# Patient Record
Sex: Female | Born: 1955 | Race: White | Hispanic: No | Smoking: Former smoker
Health system: Southern US, Community
[De-identification: ages and names within clinical notes are randomized; demographics above are authoritative.]

## PROBLEM LIST (undated history)

## (undated) DIAGNOSIS — M519 Unspecified thoracic, thoracolumbar and lumbosacral intervertebral disc disorder: Secondary | ICD-10-CM

## (undated) DIAGNOSIS — G43909 Migraine, unspecified, not intractable, without status migrainosus: Secondary | ICD-10-CM

## (undated) DIAGNOSIS — K279 Peptic ulcer, site unspecified, unspecified as acute or chronic, without hemorrhage or perforation: Secondary | ICD-10-CM

## (undated) DIAGNOSIS — F32A Depression, unspecified: Secondary | ICD-10-CM

## (undated) DIAGNOSIS — F419 Anxiety disorder, unspecified: Secondary | ICD-10-CM

## (undated) DIAGNOSIS — S82892A Other fracture of left lower leg, initial encounter for closed fracture: Secondary | ICD-10-CM

## (undated) DIAGNOSIS — C801 Malignant (primary) neoplasm, unspecified: Secondary | ICD-10-CM

## (undated) DIAGNOSIS — E785 Hyperlipidemia, unspecified: Secondary | ICD-10-CM

## (undated) DIAGNOSIS — M1990 Unspecified osteoarthritis, unspecified site: Secondary | ICD-10-CM

## (undated) DIAGNOSIS — J189 Pneumonia, unspecified organism: Secondary | ICD-10-CM

## (undated) DIAGNOSIS — N644 Mastodynia: Secondary | ICD-10-CM

## (undated) DIAGNOSIS — F329 Major depressive disorder, single episode, unspecified: Secondary | ICD-10-CM

## (undated) DIAGNOSIS — I10 Essential (primary) hypertension: Secondary | ICD-10-CM

## (undated) DIAGNOSIS — I2510 Atherosclerotic heart disease of native coronary artery without angina pectoris: Secondary | ICD-10-CM

## (undated) HISTORY — DX: Other fracture of left lower leg, initial encounter for closed fracture: S82.892A

## (undated) HISTORY — DX: Mastodynia: N64.4

## (undated) HISTORY — PX: COLONOSCOPY: SHX174

## (undated) HISTORY — PX: BREAST SURGERY: SHX581

## (undated) HISTORY — DX: Anxiety disorder, unspecified: F41.9

## (undated) HISTORY — DX: Hyperlipidemia, unspecified: E78.5

## (undated) HISTORY — PX: AUGMENTATION MAMMAPLASTY: SUR837

## (undated) HISTORY — DX: Peptic ulcer, site unspecified, unspecified as acute or chronic, without hemorrhage or perforation: K27.9

## (undated) HISTORY — PX: CARPAL TUNNEL RELEASE: SHX101

## (undated) HISTORY — DX: Atherosclerotic heart disease of native coronary artery without angina pectoris: I25.10

## (undated) HISTORY — PX: CERVICAL CONE BIOPSY: SUR198

---

## 1996-01-08 HISTORY — PX: KNEE ARTHROSCOPY: SUR90

## 1996-07-07 HISTORY — PX: KNEE ARTHROSCOPY: SHX127

## 1998-06-19 ENCOUNTER — Other Ambulatory Visit: Admission: RE | Admit: 1998-06-19 | Discharge: 1998-06-19 | Payer: Self-pay | Admitting: Obstetrics and Gynecology

## 1998-08-13 ENCOUNTER — Emergency Department (HOSPITAL_COMMUNITY): Admission: EM | Admit: 1998-08-13 | Discharge: 1998-08-13 | Payer: Self-pay | Admitting: Emergency Medicine

## 1999-06-25 ENCOUNTER — Other Ambulatory Visit: Admission: RE | Admit: 1999-06-25 | Discharge: 1999-06-25 | Payer: Self-pay | Admitting: Obstetrics and Gynecology

## 2001-06-02 ENCOUNTER — Other Ambulatory Visit: Admission: RE | Admit: 2001-06-02 | Discharge: 2001-06-02 | Payer: Self-pay | Admitting: Obstetrics and Gynecology

## 2002-04-21 ENCOUNTER — Emergency Department (HOSPITAL_COMMUNITY): Admission: EM | Admit: 2002-04-21 | Discharge: 2002-04-21 | Payer: Self-pay | Admitting: Emergency Medicine

## 2002-04-21 ENCOUNTER — Encounter: Payer: Self-pay | Admitting: Emergency Medicine

## 2002-05-18 ENCOUNTER — Emergency Department (HOSPITAL_COMMUNITY): Admission: EM | Admit: 2002-05-18 | Discharge: 2002-05-18 | Payer: Self-pay | Admitting: Emergency Medicine

## 2002-05-28 ENCOUNTER — Emergency Department (HOSPITAL_COMMUNITY): Admission: EM | Admit: 2002-05-28 | Discharge: 2002-05-28 | Payer: Self-pay | Admitting: Emergency Medicine

## 2003-03-23 ENCOUNTER — Other Ambulatory Visit: Admission: RE | Admit: 2003-03-23 | Discharge: 2003-03-23 | Payer: Self-pay | Admitting: Obstetrics and Gynecology

## 2003-04-23 ENCOUNTER — Emergency Department (HOSPITAL_COMMUNITY): Admission: EM | Admit: 2003-04-23 | Discharge: 2003-04-23 | Payer: Self-pay | Admitting: Emergency Medicine

## 2003-05-21 ENCOUNTER — Emergency Department (HOSPITAL_COMMUNITY): Admission: EM | Admit: 2003-05-21 | Discharge: 2003-05-21 | Payer: Self-pay | Admitting: Emergency Medicine

## 2007-08-22 ENCOUNTER — Encounter: Admission: RE | Admit: 2007-08-22 | Discharge: 2007-08-22 | Payer: Self-pay | Admitting: Neurosurgery

## 2008-02-29 ENCOUNTER — Ambulatory Visit (HOSPITAL_COMMUNITY): Admission: RE | Admit: 2008-02-29 | Discharge: 2008-02-29 | Payer: Self-pay | Admitting: Orthopaedic Surgery

## 2008-03-02 ENCOUNTER — Observation Stay (HOSPITAL_COMMUNITY): Admission: EM | Admit: 2008-03-02 | Discharge: 2008-03-02 | Payer: Self-pay | Admitting: Emergency Medicine

## 2008-03-22 ENCOUNTER — Ambulatory Visit (HOSPITAL_BASED_OUTPATIENT_CLINIC_OR_DEPARTMENT_OTHER): Admission: RE | Admit: 2008-03-22 | Discharge: 2008-03-22 | Payer: Self-pay | Admitting: Orthopaedic Surgery

## 2008-11-05 ENCOUNTER — Encounter: Admission: RE | Admit: 2008-11-05 | Discharge: 2008-11-05 | Payer: Self-pay | Admitting: Neurosurgery

## 2008-12-15 ENCOUNTER — Encounter: Admission: RE | Admit: 2008-12-15 | Discharge: 2008-12-15 | Payer: Self-pay | Admitting: Neurosurgery

## 2009-02-14 ENCOUNTER — Encounter: Admission: RE | Admit: 2009-02-14 | Discharge: 2009-02-14 | Payer: Self-pay | Admitting: Neurosurgery

## 2009-03-30 ENCOUNTER — Encounter: Admission: RE | Admit: 2009-03-30 | Discharge: 2009-03-30 | Payer: Self-pay | Admitting: Neurosurgery

## 2009-07-04 ENCOUNTER — Inpatient Hospital Stay (HOSPITAL_COMMUNITY): Admission: RE | Admit: 2009-07-04 | Discharge: 2009-07-07 | Payer: Self-pay | Admitting: Neurosurgery

## 2009-07-07 HISTORY — PX: SPINE SURGERY: SHX786

## 2010-03-25 LAB — URINALYSIS, ROUTINE W REFLEX MICROSCOPIC
Glucose, UA: NEGATIVE mg/dL
Hgb urine dipstick: NEGATIVE
Ketones, ur: NEGATIVE mg/dL
Nitrite: NEGATIVE
Urobilinogen, UA: 0.2 mg/dL (ref 0.0–1.0)
pH: 6.5 (ref 5.0–8.0)

## 2010-03-25 LAB — DIFFERENTIAL
Basophils Absolute: 0 10*3/uL (ref 0.0–0.1)
Eosinophils Relative: 1 % (ref 0–5)
Lymphs Abs: 1.3 10*3/uL (ref 0.7–4.0)

## 2010-03-25 LAB — ABO/RH: ABO/RH(D): A POS

## 2010-03-25 LAB — CBC
Hemoglobin: 13.6 g/dL (ref 12.0–15.0)
MCH: 29.8 pg (ref 26.0–34.0)
MCHC: 33.8 g/dL (ref 30.0–36.0)
MCV: 88.4 fL (ref 78.0–100.0)
RBC: 4.56 MIL/uL (ref 3.87–5.11)
RDW: 13.7 % (ref 11.5–15.5)
WBC: 6 10*3/uL (ref 4.0–10.5)

## 2010-03-25 LAB — SURGICAL PCR SCREEN: Staphylococcus aureus: NEGATIVE

## 2010-03-25 LAB — COMPREHENSIVE METABOLIC PANEL
Albumin: 4.2 g/dL (ref 3.5–5.2)
CO2: 23 mEq/L (ref 19–32)
GFR calc non Af Amer: >60 mL/min (ref >60)
Sodium: 141 mEq/L (ref 135–145)
Total Protein: 6.7 g/dL (ref 6.0–8.3)

## 2010-03-25 LAB — PROTIME-INR
INR: 0.91 (ref 0.00–1.49)
Prothrombin Time: 12.2 seconds (ref 11.6–15.2)

## 2010-03-25 LAB — MRSA PCR SCREENING: MRSA by PCR: NEGATIVE

## 2010-04-19 LAB — POCT I-STAT, CHEM 8
Chloride: 109 mEq/L (ref 96–112)
Glucose, Bld: 104 mg/dL — ABNORMAL HIGH (ref 70–99)
Hemoglobin: 12.9 g/dL (ref 12.0–15.0)
Potassium: 4.2 mEq/L (ref 3.5–5.1)

## 2010-04-24 LAB — COMPREHENSIVE METABOLIC PANEL
AST: 11 U/L (ref 0–37)
Albumin: 3.6 g/dL (ref 3.5–5.2)
Calcium: 9.5 mg/dL (ref 8.4–10.5)
GFR calc Af Amer: >60 mL/min (ref >60)
GFR calc non Af Amer: >60 mL/min (ref >60)
Sodium: 140 mEq/L (ref 135–145)
Total Bilirubin: 0.5 mg/dL (ref 0.3–1.2)

## 2010-04-24 LAB — BASIC METABOLIC PANEL
BUN: 10 mg/dL (ref 6–23)
CO2: 22 mEq/L (ref 19–32)
Calcium: 9.7 mg/dL (ref 8.4–10.5)
GFR calc non Af Amer: >60 mL/min (ref >60)
Sodium: 142 mEq/L (ref 135–145)

## 2010-04-24 LAB — DIFFERENTIAL
Basophils Relative: 0 % (ref 0–1)
Eosinophils Absolute: 0 10*3/uL (ref 0.0–0.7)
Lymphs Abs: 1 10*3/uL (ref 0.7–4.0)
Monocytes Absolute: 0.8 10*3/uL (ref 0.1–1.0)
Monocytes Relative: 8 % (ref 3–12)
Neutrophils Relative %: 75 % (ref 43–77)
Neutrophils Relative %: 81 % — ABNORMAL HIGH (ref 43–77)

## 2010-04-24 LAB — CBC
MCHC: 34.3 g/dL (ref 30.0–36.0)
MCV: 88.1 fL (ref 78.0–100.0)
MCV: 88.5 fL (ref 78.0–100.0)
Platelets: 207 10*3/uL (ref 150–400)
Platelets: 224 10*3/uL (ref 150–400)
RDW: 14.6 % (ref 11.5–15.5)
WBC: 6.8 10*3/uL (ref 4.0–10.5)

## 2010-04-24 LAB — CK TOTAL AND CKMB (NOT AT ARMC)
CK, MB: 0.4 ng/mL (ref 0.3–4.0)
Relative Index: INVALID (ref 0.0–2.5)
Total CK: 20 U/L (ref 7–177)
Total CK: 30 U/L (ref 7–177)

## 2010-04-24 LAB — TROPONIN I: Troponin I: <0.01 ng/mL (ref 0.00–0.06)

## 2010-04-24 LAB — POCT CARDIAC MARKERS
CKMB, poc: <1 ng/mL — ABNORMAL LOW (ref 1.0–8.0)
Myoglobin, poc: 60.2 ng/mL (ref 12–200)

## 2010-04-24 LAB — LIPID PANEL
Cholesterol: 206 mg/dL — ABNORMAL HIGH (ref 0–200)
HDL: 57 mg/dL (ref >39)
Triglycerides: 82 mg/dL (ref <150)

## 2010-05-22 NOTE — Op Note (Signed)
NAMELEMA, HEINKEL NO.:  [id number]   MEDICAL RECORD NO.:  [id number]          PATIENT TYPE:  AMB   LOCATION:  DSC                          FACILITY:  [facility]   PHYSICIAN:  Lubertha Basque. Dalldorf, M.D.DATE OF BIRTH:  07-18-1955   DATE OF PROCEDURE:  03/22/2008  DATE OF DISCHARGE:                               OPERATIVE REPORT   PREOPERATIVE DIAGNOSES:  1. Left knee torn medial meniscus.  2. Left knee chondromalacia.   POSTOPERATIVE DIAGNOSES:  1. Left knee torn medial meniscus.  2. Left knee chondromalacia.   PROCEDURES:  1. Left knee partial medial meniscectomy.  2. Left knee abrasion chondroplasty, patellofemoral.   ANESTHESIA:  General.   ATTENDING SURGEON:  Lubertha Basque. Jerl Santos, MD   ASSISTANT:  Lindwood Qua, PA   INDICATIONS FOR PROCEDURE:  The patient is a 55 year old woman with  several years of terrible left knee pain.  This has persisted despite  oral anti-inflammatories and injections.  By MRI scan done almost 2  years back, she has a complex medial meniscus tear.  She has pain which  limits her ability to rest and walk and she is offered an arthroscopy.  Informed operative consent was obtained after discussion of possible  complications including reaction to anesthesia and infection.   SUMMARY/FINDINGS/PROCEDURE:  Under general anesthesia, an arthroscopy of  the left knee was performed.  Suprapatellar pouch was benign while the  patellofemoral joint exhibited some focal breakdown in the  intertrochlear groove addressed with chondroplasty abrasion to bleeding  bone in one tiny area.  She had a pathologic medial plica which I  removed.  She had a complex tear of the middle and posterior horn of the  medial meniscus addressed about 20 or 25% partial medial meniscectomy.  She had overlying degenerative change grade 4 on the posterior aspect of  her femur directly above this area and a chondroplasty was done here.  ACL was intact.  Lateral  compartment exhibited no evidence of meniscal  or articular cartilage injury.   DESCRIPTION OF PROCEDURE:  The patient was taken to the operating suite  where general anesthetic was applied without difficulty.  She was  positioned supine and prepped and draped in normal sterile fashion.  After administration of IV Kefzol, an arthroscopy of the left knee was  performed through a total of two portals.  Findings were as noted above  and procedure consisted of the abrasion chondroplasty patellofemoral  with chondroplasty medial.  We then performed the partial medial  meniscectomy with basket and shaver taking 20-25% of the medial meniscus  in the process.  The knee was irrigated followed by placement Marcaine  with epinephrine and morphine at the end of the case.  Adaptic was  applied followed by dry gauze and loose Ace wrap.  Estimated blood loss  and intraoperative fluids can be obtained from anesthesia records.   DISPOSITION:  The patient was extubated in the operating room and taken  to recovery room in stable addition.  She was to go home same-day and  follow up in the office closely.  I will contact her by phone tonight.  Lubertha Basque Jerl Santos, M.D.  Electronically Signed     PGD/MEDQ  D:  03/22/2008  T:  03/23/2008  Job:  [id number]

## 2010-05-22 NOTE — Consult Note (Signed)
[name], [name] NO.:  [id number]   MEDICAL RECORD NO.:  [id number]          PATIENT TYPE:  AMB   LOCATION:                               FACILITY:  [facility]   PHYSICIAN:  Corky Crafts, MDDATE OF BIRTH:  02-09-1955   DATE OF CONSULTATION:  03/02/2008  DATE OF DISCHARGE:                                 CONSULTATION   CHIEF COMPLAINT:  Chest pain, syncope.   Sherri [name] is a 55 year old female with a history of an infected  tooth.  On February 29, 2008, she had a dental procedure for this  infected tooth and subsequently had right facial swelling.  She had an  additional procedure on March 01, 2008, and passed out as the  procedure finished.  She denies having any pain and states that she was  adequately anesthetized.  When she woke up, she was having chest pain,  it felt like a pressure, and she has some shortness of breath.  This  lasted for about half an hour and spontaneously resolved.  She does  remember just before she passed out, she felt funny, but cannot  elaborate.  She has never had any chest pain prior to this episode.  She  is not very active, but states that while walking stairs, she has no  trouble.  She arranges flowers for living and states that this has not  caused her any problems.   PAST MEDICAL HISTORY:  Significant for migraines.   PAST SURGICAL HISTORY:  Right knee surgery.  She has had breast surgery  in the past.   ALLERGIES:  CODEINE, ERYTHROMYCIN, and FLEXERIL.   MEDICATIONS:  Wellbutrin XL, Paxil, atenolol for migraine prevention,  Topamax, Mobic, Skelaxin, clindamycin, and Vicodin.   SOCIAL HISTORY:  No tobacco or alcohol use.  She is married.  She does  design flowers for living.   FAMILY HISTORY:  Mom had an MI at age 26.  Father had MI at age 63.   REVIEW OF SYSTEMS:  Right facial swelling, chest pain, syncope,  otherwise negative.   PHYSICAL EXAMINATION:  VITAL SIGNS:  Temperature 99.2, T-max 99.8, pulse  94, respirations 18, blood pressure 108/68, and O2 saturations 95% on  room air.  HEENT:  Grossly normal.  No carotid or subclavian bruits.  No JVD or  thyromegaly.  Sclerae clear.  Conjunctiva normal.  Nares without  drainage.  She does have some right facial swelling.  HEART:  Regular rate and rhythm.  No evidence of murmur, rub, or ectopy.  LUNGS:  Clear to auscultation bilaterally.  No wheezing or rhonchi.  ABDOMEN:  Soft, nontender, and nondistended.  No mass.  No bruits.  EXTREMITIES:  No lower extremity edema.  Palpable DP/PT pulses  bilaterally.  NEUROLOGIC:  Cranial nerves II through XII grossly intact.  PSYCH:  Normal mood and affect.   LABORATORY STUDIES:  CK-MB and troponin negative x2.  Hemoglobin 12.4,  hematocrit 36.1, white count 6.8, and platelets 207.  Total cholesterol  206, triglycerides 82, HDL 57, and LDL 133.  Sodium 140, potassium 3.7,  BUN 8, creatinine 0.76, and glucose 120.  EKG; normal sinus rhythm.  No  significant ST or T-wave changes.   ASSESSMENT AND PLAN:  1. Chest pain.  No evidence of myocardial infarction by enzymes or      EKG.  We will plan for outpatient stress test.  The patient wishes      to do this as an outpatient.  Since her enzymes and EKG are      essentially normal, we agree to do this as an outpatient.  We have      scheduled a Cardiolite for March 07, 2008.  At the same time, we      will go ahead and perform an echocardiogram as well.  2. Syncope, unclear etiology.  She could have been hypoglycemic from      being n.p.o. for the procedure.  Another theory said she has      vasovagal syncope in the setting of her surgery.  We will check the      echo to rule out structural heart disease.  3. Nausea, perhaps worsened by clindamycin.  We will encourage gradual      p.o. intake.  4. Obesity.  Will ultimately benefit from weight loss.  5. Hyperlipidemia, needs to be on a low-fat diet.  May ultimately need      statin therapy.   Again,  the patient will have a stress Cardiolite and an echo performed  in our office on March 07, 2008.  The patient was seen and examined by  Dr. Lance Muss.      Guy Franco, P.A.      Corky Crafts, MD  Electronically Signed    LB/MEDQ  D:  03/02/2008  T:  03/02/2008  Job:  [id number]   cc:   Lubertha Basque. Jerl Santos, M.D.  [facility]

## 2010-05-22 NOTE — Discharge Summary (Signed)
Sherri [name], BALDERSTON NO.:  [id number]   MEDICAL RECORD NO.:  [id number]          PATIENT TYPE:  INP   LOCATION:  3729                         FACILITY:  [facility]   PHYSICIAN:  Isidor Holts, M.D.  DATE OF BIRTH:  [birth year]   DATE OF ADMISSION:  03/01/2008  DATE OF DISCHARGE:  03/02/2008                               DISCHARGE SUMMARY   PRIMARY MEDICAL DOCTOR:  Gentry Fitz.   DISCHARGE DIAGNOSES:  1. Syncopal episode, probably vasovagal.  2. Dental abscess/tooth abscess, status post drainage.  3. Chest pain, acute coronary syndrome ruled out.  4. History of migraines.   DISCHARGE MEDICATIONS:  1. Paxil 40 mg daily.  2. Wellbutrin XL 300 mg p.o. daily.  3. Topamax 200 mg p.o. daily.  4. Atenolol 100 mg p.o. daily.  5. Vicodin (5/325) one p.o. p.r.n. q.6 hourly.  6. Clindamycin 300 mg p.o. t.i.d. for 7 days only.   PROCEDURES:  None.   ADMISSION HISTORY:  As in H and P of March 01, 2008. However, in  brief, this is a 55 year old female, with a known history of migraines  who apparently had been seen at her primary dentist's office, a Dr.  Elvis Coil, for a dental abscess which was drained on March 01, 2008.  Following the procedure, the patient reportedly got out of the  dental chair and suffered a syncopal episode.  According to the  patient's recollection, she was put back in the chair and subsequently  developed chest tightness.  This lasted approximately 10 minutes, before  EMS was summoned and brought her to the emergency department.  She has  had no recurrence since.  The patient is a nondrinker, nonsmoker.  She  was admitted for further evaluation, investigation, and management.   CLINICAL COURSE:  1. Chest pain.  This had atypical features.  The patient appeared to      have risk factors for coronary artery disease.  She was placed on      telemetry monitoring, which was unremarkable.  Cardiac enzymes were      cycled, remained  unelevated.  A 12-lead EKG showed no acute      ischemic changes.  A cardiology consultation was kindly provided by      Dr. Eldridge Dace, [facility], who has evaluated the patient and      scheduled outpatient stress testing to be done on March 07, 2008.      Acute coronary syndrome has effectively been ruled out.   1. Syncopal episode.  This is likely to be vasovagal in nature.  The      patient has had no recurrences.  Blood pressure was normal at the      time of presentation.  The patient did not appear to be volume      depleted, although she had reportedly been n.p.o. prior to her      dental procedure.   1. Dental sepsis.  The patient has been treated by her primary dentist      for a dental abscess.  She was commenced on Clindamycin therapy      during the  course of this hospitalization, and has been discharged      on a further 7 days of Clindamycin, or as otherwise indicated by      her primary dentist.  She has been requested to follow up with her      dentist on discharge.   1. Migraine headaches.  This did not prove problematic during the      course of this patient's hospitalization.   DISPOSITION:  The patient was on March 02, 2008 asymptomatic,  clinically stable, and keen to be discharged.  She did have some nausea,  which is likely secondary to Clindamycin, but no vomiting, and this  resolved as soon as the patient commenced oral intake.  The patient was  discharged on March 02, 2008.   DIET:  No restrictions.   ACTIVITY:  As tolerated.   FOLLOW-UP INSTRUCTIONS:  The patient is to follow up with Dr. Eldridge Dace,  cardiologist, on a date to be scheduled, likely March 07, 2008.  Telephone number [phone].   SPECIAL INSTRUCTIONS:  The patient is to undergo a stress Myoview as an  outpatient.  This will be arranged by Dr. Eldridge Dace, et al.      Isidor Holts, M.D.  Electronically Signed     CO/MEDQ  D:  03/02/2008  T:  03/02/2008  Job:  [id number]   cc:    Elvis Coil, DPM

## 2010-05-22 NOTE — H&P (Signed)
Sherri [name], MCMEEKIN NO.:  [id number]   MEDICAL RECORD NO.:  [id number]            PATIENT TYPE:   LOCATION:                                 FACILITY:   PHYSICIAN:  Loma Sender, MD         DATE OF BIRTH:  [birth year]   DATE OF ADMISSION:  03/01/2008  DATE OF DISCHARGE:                              HISTORY & PHYSICAL   CHIEF COMPLAINT:  Chest pain.   HISTORY OF PRESENT ILLNESS:  Sherri [name] is a 55 year old female,  fully functioning, who recently developed a tooth abscess.  Today, she  went to her dentist's office to have this abscess incised and drained.  Before the procedure, she noticed some chest tightness but quickly  resolved.  She went on to have an I&D of her tooth with drain placed.  Subsequently, after the procedure was finished, the patient had syncope  while in the chair.  When she awoke, she had crushing chest pain with  nausea and vomiting, shortness of breath and diaphoresis.  She stated  that the pain radiated to her neck and that she could not breath or  could not get deep breaths.  With these symptoms, EMS was activated and  came to the dentist's office to transport her to [facility] via  EMS.  At [facility], the patient was noted to have normal  cardiac enzymes and an EKG that showed normal sinus rhythm with no ST-T  wave changes.  With concerns of cardiac ischemia, the patient was placed  per consultation for admission.   PAST MEDICAL HISTORY:  1. Migraines.  2. Tooth abscess, currently on clindamycin.  3. Torn ligament and torn meniscus.  4. History of cervical cancer.  5. History of ovarian cancer.  6. History of breast cancer.   MEDICATIONS:  1. Paxil 40 mg one tablet daily.  2. Wellbutrin XL 300 mg one tablet daily.  3. Topamax 200 mg one time daily.  4. Atenolol 100 mg one tablet daily.  5. Clindamycin 300 mg q.8 h.   ALLERGIES:  ERYTHROMYCIN, IODINE, SHELL FISH AND FLEXERIL.   FAMILY HISTORY:  Myocardial  infarctions, congestive heart failure,  diabetes mellitus, coronary artery disease.   SOCIAL HISTORY:  The patient is unemployed, married for 14 years, has no  children.  Denies tobacco, alcohol or illicit drug use.   PHYSICAL EXAMINATION:  VITAL SIGNS:  Temperature 98.2, blood pressure  156/93, pulse 86, respiratory rate 20, saturating 99% on room air.  GENERAL:  This is a Well-nourished, well-hydrated Caucasian female in no  acute distress.  HEENT:  The patient has significant swelling along the right face and  eye with facial droop.  Otherwise, Extraocular movements intact.  Pupils  equal, round and reactive to light.  Oropharynx clear without erythema  or exudate.  The patient has poor dentition.  Nares are patent.  Hearing  is grossly intact.  NECK:  Supple without lymphadenopathy, jugular venous distention or  carotid bruits.  HEART:  Regular rate and rhythm without murmurs, rubs or gallops.  LUNGS:  Clear to auscultation bilaterally  without wheezes or rhonchi.  MUSCULOSKELETAL:  The patient has tenderness along the sternum and left  chest with palpation.  Otherwise, there are no bony abnormalities,  muscle tenderness or active synovitis.  ABDOMEN:  Obese, nontender, nondistended.  Bowel sounds are active with  no rebound or guarding.  NEUROLOGICAL:  Cranial nerves II-XII are intact.  Muscle strength is  symmetric.  Sensation is grossly normal.  SKIN:  No rashes or lesions are noted.  MENTAL STATUS:  Alert and oriented X4, memory is intact to recent and  distant events, mood is appropriate.  Affect is full.   LABORATORY DATA:  White count 9.7, hemoglobin 13.7, platelet count 224.  BMP:  Sodium 140, potassium 3.7, chloride 106, bicarbonate 23, glucose  120, BUN 8, creatinine 0.76, calcium 9.5, total protein 6.8, albumin  3.6.  AST 11, ALT 12.  Cardiac enzymes show troponin less than 0.05.  CK  less than 1.0.   EKG shows normal sinus rhythm with no ST-T wave changes, rate  88.   ASSESSMENT/PLAN:  This is a 55 year old female with recent incision and  drainage of tooth abscess who developed syncope and crushing chest pain  status post procedure.  1. Chest pain.  Will admit under observation for rule out myocardial      infarction with serial cardiac enzymes, place on telemetry.  If the      patient rules out for myocardial infarction, will plan for exercise      stress in the morning.  Will check a fasting lipid profile and      hemoglobin A1C to risk stratify the patient and start the patient      on aspirin 325 mg daily.  2. Tooth abscess.  Patient with definitive treatment with I&D per      addendum today.  Will continue the patient on clindamycin 300 mg      p.o. q.8 h for coverage of anaerobic and Gram positive bacteria.  3. Migraines.  Will continue the patient on Atenolol and Topamax.  4. Prophylaxis.  Lovenox 5000 units subcu q.8 h.  Protonix.  5. Fluids/nutrition.  Normal saline 75, monitor electrolytes, regular      diet, n.p.o. after midnight.   CODE STATUS:  Full code.      Loma Sender, MD  Electronically Signed     TJ/MEDQ  D:  03/02/2008  T:  03/02/2008  Job:  [id number]

## 2010-06-05 ENCOUNTER — Other Ambulatory Visit: Payer: Self-pay | Admitting: Neurosurgery

## 2010-06-05 DIAGNOSIS — M5020 Other cervical disc displacement, unspecified cervical region: Secondary | ICD-10-CM

## 2010-06-09 ENCOUNTER — Ambulatory Visit
Admission: RE | Admit: 2010-06-09 | Discharge: 2010-06-09 | Disposition: A | Discharge status: Home / Self Care | Payer: 59 | Source: Ambulatory Visit | Attending: Neurosurgery | Admitting: Neurosurgery

## 2010-06-09 DIAGNOSIS — M5020 Other cervical disc displacement, unspecified cervical region: Secondary | ICD-10-CM

## 2010-06-26 ENCOUNTER — Other Ambulatory Visit: Payer: Self-pay | Admitting: Neurosurgery

## 2010-06-26 DIAGNOSIS — M5020 Other cervical disc displacement, unspecified cervical region: Secondary | ICD-10-CM

## 2010-06-26 DIAGNOSIS — G959 Disease of spinal cord, unspecified: Secondary | ICD-10-CM

## 2010-07-03 ENCOUNTER — Emergency Department (HOSPITAL_COMMUNITY): Payer: 59

## 2010-07-03 ENCOUNTER — Ambulatory Visit
Admission: RE | Admit: 2010-07-03 | Discharge: 2010-07-03 | Disposition: A | Discharge status: Home / Self Care | Payer: 59 | Source: Ambulatory Visit | Attending: Neurosurgery | Admitting: Neurosurgery

## 2010-07-03 ENCOUNTER — Emergency Department (HOSPITAL_COMMUNITY)
Admission: EM | Admit: 2010-07-03 | Discharge: 2010-07-03 | Disposition: A | Discharge status: Home / Self Care | Payer: 59 | Attending: Emergency Medicine | Admitting: Emergency Medicine

## 2010-07-03 ENCOUNTER — Encounter (HOSPITAL_COMMUNITY): Payer: Self-pay | Admitting: Radiology

## 2010-07-03 DIAGNOSIS — R51 Headache: Secondary | ICD-10-CM | POA: Insufficient documentation

## 2010-07-03 DIAGNOSIS — M5020 Other cervical disc displacement, unspecified cervical region: Secondary | ICD-10-CM

## 2010-07-03 DIAGNOSIS — G959 Disease of spinal cord, unspecified: Secondary | ICD-10-CM

## 2010-07-03 DIAGNOSIS — R55 Syncope and collapse: Secondary | ICD-10-CM | POA: Insufficient documentation

## 2010-07-03 DIAGNOSIS — Z79899 Other long term (current) drug therapy: Secondary | ICD-10-CM | POA: Insufficient documentation

## 2010-07-03 DIAGNOSIS — M542 Cervicalgia: Secondary | ICD-10-CM | POA: Insufficient documentation

## 2010-07-03 DIAGNOSIS — I10 Essential (primary) hypertension: Secondary | ICD-10-CM | POA: Insufficient documentation

## 2010-07-03 HISTORY — DX: Essential (primary) hypertension: I10

## 2010-07-03 HISTORY — DX: Migraine, unspecified, not intractable, without status migrainosus: G43.909

## 2010-07-03 LAB — POCT I-STAT, CHEM 8
Creatinine, Ser: 0.8 mg/dL (ref 0.50–1.10)
Hemoglobin: 12.2 g/dL (ref 12.0–15.0)
Sodium: 143 mEq/L (ref 135–145)
TCO2: 22 mmol/L (ref 0–100)

## 2010-07-03 LAB — DIFFERENTIAL
Basophils Relative: 0 % (ref 0–1)
Eosinophils Absolute: 0.1 10*3/uL (ref 0.0–0.7)
Monocytes Absolute: 0.3 10*3/uL (ref 0.1–1.0)
Monocytes Relative: 8 % (ref 3–12)

## 2010-07-03 LAB — CBC
MCH: 29.4 pg (ref 26.0–34.0)
MCHC: 34.1 g/dL (ref 30.0–36.0)
Platelets: 183 10*3/uL (ref 150–400)

## 2010-07-03 LAB — CK TOTAL AND CKMB (NOT AT ARMC): CK, MB: 1.7 ng/mL (ref 0.3–4.0)

## 2010-11-01 ENCOUNTER — Other Ambulatory Visit: Payer: Self-pay | Admitting: Neurosurgery

## 2010-11-01 DIAGNOSIS — M545 Low back pain, unspecified: Secondary | ICD-10-CM

## 2010-11-06 ENCOUNTER — Ambulatory Visit
Admission: RE | Admit: 2010-11-06 | Discharge: 2010-11-06 | Disposition: A | Discharge status: Home / Self Care | Payer: 59 | Source: Ambulatory Visit | Attending: Neurosurgery | Admitting: Neurosurgery

## 2010-11-06 DIAGNOSIS — M545 Low back pain, unspecified: Secondary | ICD-10-CM

## 2011-01-09 ENCOUNTER — Other Ambulatory Visit: Payer: Self-pay | Admitting: Orthopaedic Surgery

## 2011-01-09 DIAGNOSIS — M25511 Pain in right shoulder: Secondary | ICD-10-CM

## 2011-01-13 ENCOUNTER — Ambulatory Visit
Admission: RE | Admit: 2011-01-13 | Discharge: 2011-01-13 | Disposition: A | Discharge status: Home / Self Care | Payer: 59 | Source: Ambulatory Visit | Attending: Orthopaedic Surgery | Admitting: Orthopaedic Surgery

## 2011-01-13 DIAGNOSIS — M25511 Pain in right shoulder: Secondary | ICD-10-CM

## 2011-01-14 ENCOUNTER — Other Ambulatory Visit: Payer: 59

## 2011-02-14 ENCOUNTER — Encounter: Payer: Self-pay | Admitting: Physician Assistant

## 2011-02-14 ENCOUNTER — Ambulatory Visit (INDEPENDENT_AMBULATORY_CARE_PROVIDER_SITE_OTHER): Payer: 59 | Admitting: Physician Assistant

## 2011-02-14 DIAGNOSIS — F430 Acute stress reaction: Secondary | ICD-10-CM

## 2011-02-14 DIAGNOSIS — G43009 Migraine without aura, not intractable, without status migrainosus: Secondary | ICD-10-CM

## 2011-02-14 DIAGNOSIS — B009 Herpesviral infection, unspecified: Secondary | ICD-10-CM | POA: Insufficient documentation

## 2011-02-14 DIAGNOSIS — F329 Major depressive disorder, single episode, unspecified: Secondary | ICD-10-CM | POA: Insufficient documentation

## 2011-02-14 DIAGNOSIS — F339 Major depressive disorder, recurrent, unspecified: Secondary | ICD-10-CM

## 2011-02-14 DIAGNOSIS — F32A Depression, unspecified: Secondary | ICD-10-CM | POA: Insufficient documentation

## 2011-02-14 DIAGNOSIS — A6004 Herpesviral vulvovaginitis: Secondary | ICD-10-CM

## 2011-02-14 DIAGNOSIS — H5440 Blindness, one eye, unspecified eye: Secondary | ICD-10-CM | POA: Insufficient documentation

## 2011-02-14 DIAGNOSIS — E669 Obesity, unspecified: Secondary | ICD-10-CM

## 2011-02-14 MED ORDER — PROMETHAZINE HCL 12.5 MG PO TABS: 12.5000 mg | ORAL_TABLET | Freq: Four times a day (QID) | ORAL | Status: DC | PRN

## 2011-02-14 MED ORDER — CLONAZEPAM 0.5 MG PO TABS: 0.5000 mg | ORAL_TABLET | Freq: Two times a day (BID) | ORAL | Status: DC | PRN

## 2011-02-14 MED ORDER — VALACYCLOVIR HCL 1 G PO TABS: 1000.0000 mg | ORAL_TABLET | ORAL | Status: DC | PRN

## 2011-02-14 MED ORDER — KETOROLAC TROMETHAMINE 10 MG PO TABS: 10.0000 mg | ORAL_TABLET | Freq: Four times a day (QID) | ORAL | Status: DC | PRN

## 2011-02-14 MED ORDER — ATENOLOL 100 MG PO TABS: 100.0000 mg | ORAL_TABLET | Freq: Every day | ORAL | Status: DC

## 2011-02-14 MED ORDER — PAROXETINE HCL 40 MG PO TABS: 40.0000 mg | ORAL_TABLET | Freq: Every day | ORAL | Status: DC

## 2011-02-14 MED ORDER — BUPROPION HCL ER (XL) 150 MG PO TB24: 450.0000 mg | ORAL_TABLET | Freq: Every day | ORAL | Status: DC

## 2011-02-14 NOTE — Patient Instructions (Signed)
Consider calling in Hospice early to help you. Identify things that you need, things that other people can do to help you, and tell them to the people who want to help you.

## 2011-02-14 NOTE — Progress Notes (Addendum)
Patient presents for medication refills. She also needs to discuss acute situational stress that she is experiencing. Husband is dying of lung cancer.  Expectancy is 2 months. Thus far he has been unwilling to bring in hospice, as he believes it indicates imminent death. She is working with him and their attorney to do estate planning. One of his sisters is helping her. She is estranged from her own siblings and as a debt to her mothers estate, exacerbating her concerns over financial matters once her husband dies.  Not sleeping, listening out for husband-gets up with him for balance, etc. Feels overwhelmed. No thoughts of self-harm. Crying all the time. "I just don't know what to do."  Review of systems is remarkable for fatigue, emotional lability. No chest pain, shortness of breath, visual changes, dizziness, headache, GI or GU symptoms, myalgias, arthralgias, or rash.  Exam: Awake alert and oriented. Well-developed and well-nourished. Tearful. No respiratory distress. Vital signs are noted. Mood affect and behavior are appropriate.  Assessment: 1. Migraine headache, stable. 2. HSV, type II, stable. 3. Depression, exacerbated by current grief. 4. Acute situational stress, grief, bereavement.  Plan: Increase bupropion to 450 mg daily. Add clonazepam 0.5 mg by mouth twice a day when necessary anxiety. I have encouraged her to identify things that would help her, give her a break, so that when family members ask what they can do she has something ready to offer. This will prevent folks from trying to do things that she doesn't want them to do (Her sisters-in-law "keep trying to" un-decorate her Christmas, which her husband has asked her to leave in place). Reevaluate in 4 weeks, sooner if needed.

## 2011-02-15 DIAGNOSIS — G43719 Chronic migraine without aura, intractable, without status migrainosus: Secondary | ICD-10-CM | POA: Insufficient documentation

## 2011-03-10 ENCOUNTER — Other Ambulatory Visit: Payer: Self-pay | Admitting: Family Medicine

## 2011-03-21 ENCOUNTER — Encounter: Payer: Self-pay | Admitting: Physician Assistant

## 2011-03-21 ENCOUNTER — Ambulatory Visit (INDEPENDENT_AMBULATORY_CARE_PROVIDER_SITE_OTHER): Payer: 59 | Admitting: Physician Assistant

## 2011-03-21 VITALS — BP 142/85 | HR 65 | Temp 99.2°F | Resp 16 | Ht 68.0 in | Wt 238.0 lb

## 2011-03-21 DIAGNOSIS — F329 Major depressive disorder, single episode, unspecified: Secondary | ICD-10-CM

## 2011-03-21 DIAGNOSIS — F438 Other reactions to severe stress: Secondary | ICD-10-CM

## 2011-03-21 NOTE — Patient Instructions (Signed)
Continue all your good work!  Right foot, left foot, breathe.

## 2011-03-21 NOTE — Progress Notes (Signed)
  Subjective:    Patient ID: Sherri [name], female    DOB: [birth year], 56 y.o.   MRN: [id number]  HPI Presents for re-evaluation of acute situational stress reaction and depression.  Is doing MUCH better.  Feeling in control.  Is able to make arrangements with her husband for his anticipated death.  Has been able to offer "jobs" for her family to do that are helpful to them.  His youngest brother is really struggling with the pending death and they'll been able to get him a counselor with Hospice as well.  Feels good on her current medication regimen.  No SI/HI.  Sleeping better.  Able to focus/concentrate.  Less tearful.  Has had a possible recurrence of breast cancer based on her most recent mammogram.  Surgical evaluation is pending.  Despite this, her mood is good.   Review of Systems     Objective:   Physical Exam  Vital signs noted. Well-developed, well nourished WF who is awake, alert and oriented, in NAD. HEENT: [place]/AT, sclera and conjunctiva are clear.  Neck: supple, non-tender, no lymphadenopathey, thyromegaly. Heart: RRR, no murmur Lungs: CTA Skin: warm and dry without rash.       Assessment & Plan:  Acute situationsal stress reaction. Depression. H/o Breast cancer, possible recurrence.  Continue current treatment and evaluation plan.

## 2011-04-19 ENCOUNTER — Encounter (INDEPENDENT_AMBULATORY_CARE_PROVIDER_SITE_OTHER): Payer: Self-pay | Admitting: General Surgery

## 2011-04-19 ENCOUNTER — Ambulatory Visit (INDEPENDENT_AMBULATORY_CARE_PROVIDER_SITE_OTHER): Payer: 59 | Admitting: General Surgery

## 2011-04-19 VITALS — BP 150/104 | HR 68 | Temp 97.1°F | Resp 18 | Ht 69.0 in | Wt 237.4 lb

## 2011-04-19 DIAGNOSIS — L538 Other specified erythematous conditions: Secondary | ICD-10-CM

## 2011-04-19 DIAGNOSIS — L539 Erythematous condition, unspecified: Secondary | ICD-10-CM

## 2011-04-19 NOTE — Progress Notes (Signed)
Patient ID: Sherri [name], female   DOB: Dec 22, 1955, 56 y.o.   MRN: [id number]  Chief Complaint  Patient presents with  . Other    new pt- r/o pagets disease    HPI Sherri [name] is a 56 y.o. female.  Referred by Dr. Dominga Ferry HPI 60 yof with history of right breast mass removed when young that was benign.  She underwent reconstruction with subglandular implant at that time.  She does have a family history of breast cancer in her mother.  Since then she has not had any issues with either breast or an abnl MMG.  Over past 3-4 months she has had increased itching surrounding her right areola.  She has also noted some redness around this.  She denies nipple discharge or any other complaints.  She notes a small mass in the medial aspect of her breast that is on her skin.  She underwent nl mmg with u/s showing the mass appearing to be a skin cyst.  She was then referred for evaluation of her redness and itching. Her husband currently has stage IV lung cancer.  Past Medical History  Diagnosis Date  . Hypertension   . Migraines   . Breast pain, right     Past Surgical History  Procedure Date  . Spine surgery 72011    L2-L5 stabilization  . Cervical cone biopsy   . Knee arthroscopy 07/1996  . Carpal tunnel release     right wrist  . Breast surgery     lumpectomy in 20s for benign mass with implant placement (subglandular)    Family History  Problem Relation Age of Onset  . Diabetes Mother   . Glaucoma Mother   . Cancer Mother     breast, ovarian  . Heart disease Mother   . Gout Father   . Cancer Sister     Social History History  Substance Use Topics  . Smoking status: Former Smoker -- 1 years    Types: Cigarettes    Quit date: 01/08/1976  . Smokeless tobacco: Not on file  . Alcohol Use: No    Allergies  Allergen Reactions  . Shellfish Allergy Anaphylaxis  . Effexor (Venlafaxine Hydrochloride) Other (See Comments)    HALLUCINATIONS  . Flexeril (Cyclobenzaprine  Hcl) Other (See Comments)    HALLUCINATIONS AND "OUT OF CONTROL"  . Nubain (Nalbuphine Hcl) Other (See Comments)    HALLUCINATIONS  . Codeine Swelling and Rash  . Erythromycin Rash    Current Outpatient Prescriptions  Medication Sig Dispense Refill  . atenolol (TENORMIN) 100 MG tablet Take 1 tablet (100 mg total) by mouth daily.  30 tablet  5  . buPROPion (WELLBUTRIN XL) 150 MG 24 hr tablet Take 3 tablets (450 mg total) by mouth daily.  90 tablet  5  . ketorolac (TORADOL) 10 MG tablet Take 1 tablet (10 mg total) by mouth every 6 (six) hours as needed.  20 tablet  5  . oxyCODONE-acetaminophen (PERCOCET) 10-325 MG per tablet Take 1 tablet by mouth every 6 (six) hours as needed.      Marland Kitchen PARoxetine (PAXIL) 40 MG tablet Take 1 tablet (40 mg total) by mouth daily.  30 tablet  5  . promethazine (PHENERGAN) 12.5 MG tablet Take 1 tablet (12.5 mg total) by mouth every 6 (six) hours as needed.  30 tablet  5  . topiramate (TOPAMAX) 100 MG tablet Take 100 mg by mouth 4 (four) times daily.      . valACYclovir (VALTREX)  1000 MG tablet Take 1 tablet (1,000 mg total) by mouth as needed.  30 tablet  5  . clonazePAM (KLONOPIN) 0.5 MG tablet Take 1 tablet (0.5 mg total) by mouth 2 (two) times daily as needed for anxiety.  30 tablet  0    Review of Systems Review of Systems  Constitutional: Negative for fever, chills and unexpected weight change.  HENT: Negative for hearing loss, congestion, sore throat, trouble swallowing and voice change.   Eyes: Negative for visual disturbance.  Respiratory: Negative for cough and wheezing.   Cardiovascular: Negative for chest pain, palpitations and leg swelling.  Gastrointestinal: Negative for nausea, vomiting, abdominal pain, diarrhea, constipation, blood in stool, abdominal distention and anal bleeding.  Genitourinary: Negative for hematuria, vaginal bleeding and difficulty urinating.  Musculoskeletal: Negative for arthralgias.  Skin: Negative for rash and wound.    Neurological: Negative for seizures, syncope and headaches.  Hematological: Negative for adenopathy. Does not bruise/bleed easily.  Psychiatric/Behavioral: Negative for confusion.    Blood pressure 150/104, pulse 68, temperature 97.1 F (36.2 C), temperature source Temporal, resp. rate 18, height 5\' 9"  (1.753 m), weight 237 lb 6.4 oz (107.684 kg).  Physical Exam Physical Exam  Vitals reviewed. Constitutional: She appears well-developed and well-nourished.  Cardiovascular: Normal rate, regular rhythm and normal heart sounds.   Pulmonary/Chest: Effort normal and breath sounds normal. Right breast exhibits mass (see diagram,, this is skin cyst not breast mass) and skin change. Right breast exhibits no inverted nipple, no nipple discharge and no tenderness. Left breast exhibits no inverted nipple, no mass, no nipple discharge, no skin change and no tenderness. Breasts are symmetrical.    Lymphadenopathy:    She has no cervical adenopathy.    She has no axillary adenopathy.       Right: No supraclavicular adenopathy present.       Left: No supraclavicular adenopathy present.    Data Reviewed MMG [facility] shows BIRADS 2 with presence of subglandular implant.  [place] of palpable area medial breast consistent with skin cyst  Assessment    Right breast erythema and itching    Plan    She has nl mmg and skin cyst on that breast.  She does have erythema in area .  I did a 4 mm punch biopsy that I closed with 4-0 nylon today.  She will return next week to get stitch out.  I also ordered MRI due to FH, presence of implant and her current symptoms.  I will see her back when that is complete       Sherri [name] 04/19/2011, 2:12 PM

## 2011-04-22 ENCOUNTER — Telehealth (INDEPENDENT_AMBULATORY_CARE_PROVIDER_SITE_OTHER): Payer: Self-pay | Admitting: General Surgery

## 2011-05-02 ENCOUNTER — Ambulatory Visit
Admission: RE | Admit: 2011-05-02 | Discharge: 2011-05-02 | Disposition: A | Discharge status: Home / Self Care | Payer: 59 | Source: Ambulatory Visit | Attending: General Surgery | Admitting: General Surgery

## 2011-05-02 DIAGNOSIS — L539 Erythematous condition, unspecified: Secondary | ICD-10-CM

## 2011-05-02 MED ORDER — GADOBENATE DIMEGLUMINE 529 MG/ML IV SOLN
20.0000 mL | Freq: Once | INTRAVENOUS | Status: AC | PRN
  Administered 2011-05-02: 20 mL via INTRAVENOUS

## 2011-05-03 ENCOUNTER — Telehealth (INDEPENDENT_AMBULATORY_CARE_PROVIDER_SITE_OTHER): Payer: Self-pay

## 2011-05-03 NOTE — Telephone Encounter (Signed)
LMOM for pt to call me back so I can give results.

## 2011-05-06 ENCOUNTER — Telehealth (INDEPENDENT_AMBULATORY_CARE_PROVIDER_SITE_OTHER): Payer: Self-pay

## 2011-05-06 NOTE — Telephone Encounter (Signed)
Pt returned my call and I notified her of the MRI along w/Bx to be normal. We made a f/u appt to see Dr Dwain Sarna on 05/20/11.

## 2011-05-20 ENCOUNTER — Encounter (INDEPENDENT_AMBULATORY_CARE_PROVIDER_SITE_OTHER): Payer: Self-pay | Admitting: General Surgery

## 2011-05-20 ENCOUNTER — Ambulatory Visit (INDEPENDENT_AMBULATORY_CARE_PROVIDER_SITE_OTHER): Payer: 59 | Admitting: General Surgery

## 2011-05-20 VITALS — BP 138/90 | HR 76 | Temp 98.5°F | Resp 14 | Ht 69.0 in | Wt 237.8 lb

## 2011-05-20 DIAGNOSIS — N644 Mastodynia: Secondary | ICD-10-CM

## 2011-05-20 NOTE — Progress Notes (Signed)
Subjective:     Patient ID: Sherri [name], female   DOB: 03-Dec-1955, 56 y.o.   MRN: [id number]  HPI This is a 56 year old female I saw recently with right breast itching and some possible erythema. She has a history of a right breast mass removed she was younger that was benign and reconstruction with an implant. I saw her and I did a punch biopsy of what appeared to be in the affected area possibly associated with some erythema. This biopsy shows sparse perivascular dermatitis and is negative for malignancy. Also send her to get an MRI which shows some mild right areolar and periareolar skin thickening as well as what I think is related to my recent punch biopsy. Everything else was normal on this. She comes back in today stating that she still has some significant itching. She does not still report any nipple discharge or crusting.  Review of Systems     Objective:   Physical Exam Right breast with healing punch biopsy site, area of itching but I really don't identify any erythema today    Assessment:     Right breast itching    Plan:        I think as much as I can rule out malignancy at this point I have. There Is not really anything else to biopsy  right now. I will plan on seeing her in 6 weeks just to continue to followup I do think the area looks better although she still complains of significant amount of itching. Her MRI and her punch biopsy are both negative. Also send her to dermatology for evaluation given her punch biopsy results.

## 2011-07-02 ENCOUNTER — Ambulatory Visit (INDEPENDENT_AMBULATORY_CARE_PROVIDER_SITE_OTHER): Payer: 59 | Admitting: General Surgery

## 2011-07-02 ENCOUNTER — Encounter (INDEPENDENT_AMBULATORY_CARE_PROVIDER_SITE_OTHER): Payer: Self-pay | Admitting: General Surgery

## 2011-07-02 VITALS — BP 140/83 | HR 74 | Temp 98.0°F | Ht 68.5 in | Wt 244.4 lb

## 2011-07-02 DIAGNOSIS — L259 Unspecified contact dermatitis, unspecified cause: Secondary | ICD-10-CM

## 2011-07-02 DIAGNOSIS — L309 Dermatitis, unspecified: Secondary | ICD-10-CM

## 2011-07-02 NOTE — Progress Notes (Signed)
Subjective:     Patient ID: Sherri [name], female   DOB: Sep 28, 1955, 56 y.o.   MRN: [id number]  HPI  60 yof with history of right breast mass removed when young that was benign. She underwent reconstruction with subglandular implant at that time. She does have a family history of breast cancer in her mother. Since then she has not had any issues with either breast or an abnl MMG. Over past number of months she has had increased itching surrounding her right areola. She has also noted some redness around this. She denies nipple discharge or any other complaints. She notes a small mass in the medial aspect of her breast that is on her skin. She underwent nl mmg with u/s showing the mass appearing to be a skin cyst. I sent her for mri that was really normal and did a punch biopsy consistent with dermatitis.  She returns today doing well.  She has seen dermatology and was put on a steroid cream.  The breast feels better with less pain and less itching. She notes no other changes.    Review of Systems BILATERAL BREAST MRI WITH AND WITHOUT CONTRAST  Technique: Multiplanar, multisequence MR images of both breasts  were obtained prior to and following the intravenous administration  of 20ml of Multihance. Three dimensional images were evaluated at  the independent DynaCad workstation.  Comparison: Mammograms and ultrasound from [facility] dated 03/07/2011  and 10/22/2007  Findings: Mild normal background parenchymal enhancement  bilaterally is identified.  There is mild right areolar/periareolar skin thickening.  A focal 5 x 9 x 5 mm area of persistent subcutaneous/skin  enhancement is noted at the 10 o'clock position of the right  breast, approximately 3 cm from the nipple. This may be related to  recent punch biopsy but correlate clinically.  No other abnormal areas of enhancement are identified within either  breast.  A subglandular right breast implant is present.  No enlarged or  abnormal-appearing lymph nodes are present.  The visualized upper liver and bony structures are unremarkable.  IMPRESSION:  Mild right areolar/periareolar skin thickening - correlate  clinically and with recent biopsy. No evidence of underlying right  breast mass or abnormal enhancement.  5 x 9 x 5 mm subcutaneous/skin enhancement at the 10 o'clock  position of the right breast, 3 cm from the nipple. This likely is  related to recent biopsy changes but correlate clinically with  biopsy location.  No other abnormal areas of enhancement bilaterally to suggest  breast malignancy.  Right subglandular breast implant.     Objective:   Physical Exam  Pulmonary/Chest: Right breast exhibits no inverted nipple, no mass, no nipple discharge, no skin change and no tenderness.  Lymphadenopathy:       Right axillary: No pectoral and no lateral adenopathy present.       Assessment:     Right breast pain/itching    Plan:     I do not again see any evidence of cancer as the source of her pain/itching.  I do not think it is related to her implant either.  I think would be reasonable to continue following her and symptomatic treatment.  I don't think any further evaluation is necessary and no surgery is indicated.  I will see her back in 6 months or sooner if she has any questions.

## 2011-07-26 ENCOUNTER — Other Ambulatory Visit: Payer: Self-pay | Admitting: Neurosurgery

## 2011-07-26 DIAGNOSIS — M47812 Spondylosis without myelopathy or radiculopathy, cervical region: Secondary | ICD-10-CM

## 2011-07-30 ENCOUNTER — Other Ambulatory Visit: Payer: 59

## 2011-08-02 ENCOUNTER — Other Ambulatory Visit: Payer: 59

## 2011-08-08 DIAGNOSIS — Z0271 Encounter for disability determination: Secondary | ICD-10-CM

## 2011-08-15 ENCOUNTER — Encounter: Payer: Self-pay | Admitting: Physician Assistant

## 2011-08-15 ENCOUNTER — Ambulatory Visit (INDEPENDENT_AMBULATORY_CARE_PROVIDER_SITE_OTHER): Payer: 59 | Admitting: Physician Assistant

## 2011-08-15 VITALS — BP 120/73 | HR 69 | Temp 98.4°F | Resp 16 | Ht 67.5 in | Wt 241.0 lb

## 2011-08-15 DIAGNOSIS — Z23 Encounter for immunization: Secondary | ICD-10-CM

## 2011-08-15 DIAGNOSIS — F438 Other reactions to severe stress: Secondary | ICD-10-CM

## 2011-08-15 DIAGNOSIS — F430 Acute stress reaction: Secondary | ICD-10-CM

## 2011-08-15 MED ORDER — CLONAZEPAM 0.5 MG PO TABS: 0.5000 mg | ORAL_TABLET | Freq: Two times a day (BID) | ORAL | Status: AC | PRN
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
## 2011-08-19 ENCOUNTER — Ambulatory Visit
Admission: RE | Admit: 2011-08-19 | Discharge: 2011-08-19 | Disposition: A | Discharge status: Home / Self Care | Payer: 59 | Source: Ambulatory Visit | Attending: Neurosurgery | Admitting: Neurosurgery

## 2011-08-19 VITALS — BP 153/63 | HR 69

## 2011-08-19 DIAGNOSIS — M47812 Spondylosis without myelopathy or radiculopathy, cervical region: Secondary | ICD-10-CM

## 2011-08-19 MED ORDER — TRIAMCINOLONE ACETONIDE 40 MG/ML IJ SUSP (RADIOLOGY)
60.0000 mg | Freq: Once | INTRAMUSCULAR | Status: AC
  Administered 2011-08-19: 60 mg via EPIDURAL

## 2011-08-19 MED ORDER — IOHEXOL 300 MG/ML  SOLN
1.0000 mL | Freq: Once | INTRAMUSCULAR | Status: AC | PRN
  Administered 2011-08-19: 1 mL via EPIDURAL

## 2011-08-22 ENCOUNTER — Ambulatory Visit: Payer: 59 | Admitting: Physician Assistant

## 2011-09-03 ENCOUNTER — Telehealth: Payer: Self-pay

## 2011-09-03 NOTE — Telephone Encounter (Signed)
Please get more symptoms. Fever? Cough? Productive? ST? What has she tried?

## 2011-09-03 NOTE — Telephone Encounter (Signed)
Pt is in need of a rx refill on an antibiotic, she is unable to come in to the office due to her husband, he has cancer and can not be left alone she states that she spoke with Chelle about this matter and would like to see if there could be a rx called into pharmacy for her bronchitis. [phone]

## 2011-09-04 MED ORDER — DOXYCYCLINE HYCLATE 100 MG PO CAPS: 100.0000 mg | ORAL_CAPSULE | Freq: Two times a day (BID) | ORAL | Status: AC

## 2011-09-04 MED ORDER — GUAIFENESIN ER 1200 MG PO TB12: 1.0000 | ORAL_TABLET | Freq: Two times a day (BID) | ORAL | Status: DC | PRN

## 2011-09-04 MED ORDER — BENZONATATE 100 MG PO CAPS: 100.0000 mg | ORAL_CAPSULE | Freq: Three times a day (TID) | ORAL | Status: AC | PRN

## 2011-09-04 MED ORDER — IPRATROPIUM BROMIDE 0.03 % NA SOLN: 2.0000 | Freq: Two times a day (BID) | NASAL | Status: DC

## 2011-09-04 NOTE — Telephone Encounter (Signed)
Rx'd antibiotic, cough med and nasal spray, plus mucinex.

## 2011-09-04 NOTE — Telephone Encounter (Signed)
Pt CB and reports she started w/ST,running nose and watery eyes 3 days ago and thought it was allergies. She took Zyrtec, but just continued to worsen. She still has these Sxs, but it has moved to her chest and she is coughing up dark yellowish mucus. Coughing day and nighttime. No fever, no SOB or wheezing. Voice is hoarse. Pt stated that she can not leave her husband right now bc he is in the last stages of lung cancer and doesn't want her out of his sight. She is afraid this is turning into bronchitis and would like an Abx if at all possible. Cough med? She was just in earlier in month to see Chelle and D/W her situation w/husband.

## 2011-09-04 NOTE — Telephone Encounter (Signed)
LM w/husband to have pt CB.

## 2011-09-05 NOTE — Telephone Encounter (Signed)
I have called left message for her to advise these were sent in for her.

## 2011-09-15 ENCOUNTER — Other Ambulatory Visit: Payer: Self-pay | Admitting: Physician Assistant

## 2012-02-04 ENCOUNTER — Telehealth: Payer: Self-pay

## 2012-02-04 MED ORDER — BUPROPION HCL ER (XL) 150 MG PO TB24: 150.0000 mg | ORAL_TABLET | Freq: Three times a day (TID) | ORAL | Status: DC

## 2012-02-04 MED ORDER — PAROXETINE HCL 40 MG PO TABS: 40.0000 mg | ORAL_TABLET | Freq: Every day | ORAL | Status: DC

## 2012-02-04 MED ORDER — TOPIRAMATE 100 MG PO TABS: 100.0000 mg | ORAL_TABLET | Freq: Four times a day (QID) | ORAL | Status: DC

## 2012-02-04 MED ORDER — ATENOLOL 100 MG PO TABS: 100.0000 mg | ORAL_TABLET | Freq: Every day | ORAL | Status: DC

## 2012-02-04 NOTE — Telephone Encounter (Signed)
Rx sent to pharmacy. Needs office visit for additional refills

## 2012-02-04 NOTE — Telephone Encounter (Signed)
Patient is out of all of her medication and has an appt schedule with Chelle on 2/13. She would like to know if she could have refills to last her until her appt date.    [facility]

## 2012-02-04 NOTE — Telephone Encounter (Signed)
Need to know specifically what meds she is requesting. Called her pended them, please advise.

## 2012-02-05 NOTE — Telephone Encounter (Signed)
Pt notified that rx was sent in 

## 2012-02-20 ENCOUNTER — Ambulatory Visit: Payer: 59 | Admitting: Physician Assistant

## 2012-02-27 ENCOUNTER — Ambulatory Visit (INDEPENDENT_AMBULATORY_CARE_PROVIDER_SITE_OTHER): Payer: 59 | Admitting: Physician Assistant

## 2012-02-27 ENCOUNTER — Encounter: Payer: Self-pay | Admitting: Physician Assistant

## 2012-02-27 VITALS — BP 145/82 | HR 71 | Temp 98.9°F | Resp 16 | Ht 67.5 in | Wt 237.0 lb

## 2012-02-27 DIAGNOSIS — M5380 Other specified dorsopathies, site unspecified: Secondary | ICD-10-CM

## 2012-02-27 DIAGNOSIS — F329 Major depressive disorder, single episode, unspecified: Secondary | ICD-10-CM

## 2012-02-27 DIAGNOSIS — M62830 Muscle spasm of back: Secondary | ICD-10-CM

## 2012-02-27 DIAGNOSIS — F430 Acute stress reaction: Secondary | ICD-10-CM

## 2012-02-27 DIAGNOSIS — K089 Disorder of teeth and supporting structures, unspecified: Secondary | ICD-10-CM

## 2012-02-27 DIAGNOSIS — I10 Essential (primary) hypertension: Secondary | ICD-10-CM

## 2012-02-27 DIAGNOSIS — K0889 Other specified disorders of teeth and supporting structures: Secondary | ICD-10-CM

## 2012-02-27 MED ORDER — PAROXETINE HCL 40 MG PO TABS: 40.0000 mg | ORAL_TABLET | Freq: Every day | ORAL | Status: DC

## 2012-02-27 MED ORDER — ATENOLOL 100 MG PO TABS: 100.0000 mg | ORAL_TABLET | Freq: Every day | ORAL | Status: DC

## 2012-02-27 MED ORDER — METAXALONE 800 MG PO TABS: 800.0000 mg | ORAL_TABLET | Freq: Three times a day (TID) | ORAL | Status: AC | PRN

## 2012-02-27 MED ORDER — BUPROPION HCL ER (XL) 150 MG PO TB24: 450.0000 mg | ORAL_TABLET | Freq: Every day | ORAL | Status: DC

## 2012-02-27 MED ORDER — AMOXICILLIN 875 MG PO TABS: 875.0000 mg | ORAL_TABLET | Freq: Two times a day (BID) | ORAL | Status: DC

## 2012-02-27 MED ORDER — CLONAZEPAM 0.5 MG PO TABS: 0.5000 mg | ORAL_TABLET | Freq: Two times a day (BID) | ORAL | Status: DC | PRN

## 2012-02-27 NOTE — Patient Instructions (Signed)
Continue the counseling and your current medications.
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
## 2012-03-16 ENCOUNTER — Other Ambulatory Visit: Payer: Self-pay | Admitting: Physician Assistant

## 2012-05-08 ENCOUNTER — Other Ambulatory Visit: Payer: Self-pay | Admitting: Neurosurgery

## 2012-05-08 DIAGNOSIS — M47817 Spondylosis without myelopathy or radiculopathy, lumbosacral region: Secondary | ICD-10-CM

## 2012-05-15 ENCOUNTER — Ambulatory Visit
Admission: RE | Admit: 2012-05-15 | Discharge: 2012-05-15 | Disposition: A | Discharge status: Home / Self Care | Payer: BC Managed Care – PPO | Source: Ambulatory Visit | Attending: Neurosurgery | Admitting: Neurosurgery

## 2012-05-15 DIAGNOSIS — M47817 Spondylosis without myelopathy or radiculopathy, lumbosacral region: Secondary | ICD-10-CM

## 2012-05-19 ENCOUNTER — Other Ambulatory Visit: Payer: Self-pay | Admitting: Neurosurgery

## 2012-05-19 DIAGNOSIS — M47816 Spondylosis without myelopathy or radiculopathy, lumbar region: Secondary | ICD-10-CM

## 2012-05-28 ENCOUNTER — Other Ambulatory Visit: Payer: BC Managed Care – PPO

## 2012-06-08 ENCOUNTER — Ambulatory Visit
Admission: RE | Admit: 2012-06-08 | Discharge: 2012-06-08 | Disposition: A | Discharge status: Home / Self Care | Payer: BC Managed Care – PPO | Source: Ambulatory Visit | Attending: Neurosurgery | Admitting: Neurosurgery

## 2012-06-08 DIAGNOSIS — M47816 Spondylosis without myelopathy or radiculopathy, lumbar region: Secondary | ICD-10-CM

## 2012-06-08 MED ORDER — METHYLPREDNISOLONE ACETATE 40 MG/ML INJ SUSP (RADIOLOG
120.0000 mg | Freq: Once | INTRAMUSCULAR | Status: AC
  Administered 2012-06-08: 120 mg via EPIDURAL

## 2012-06-08 MED ORDER — IOHEXOL 180 MG/ML  SOLN
1.0000 mL | Freq: Once | INTRAMUSCULAR | Status: AC | PRN
  Administered 2012-06-08: 1 mL via EPIDURAL

## 2012-06-11 ENCOUNTER — Ambulatory Visit
Admission: RE | Admit: 2012-06-11 | Discharge: 2012-06-11 | Disposition: A | Discharge status: Home / Self Care | Payer: BC Managed Care – PPO | Source: Ambulatory Visit | Attending: Neurosurgery | Admitting: Neurosurgery

## 2012-06-11 ENCOUNTER — Telehealth: Payer: Self-pay | Admitting: Radiology

## 2012-06-11 ENCOUNTER — Other Ambulatory Visit: Payer: Self-pay | Admitting: Neurosurgery

## 2012-06-11 MED ORDER — ONDANSETRON HCL 4 MG/2ML IJ SOLN
4.0000 mg | Freq: Once | INTRAMUSCULAR | Status: AC
  Administered 2012-06-11: 4 mg via INTRAMUSCULAR

## 2012-06-11 MED ORDER — MEPERIDINE HCL 100 MG/ML IJ SOLN
100.0000 mg | Freq: Once | INTRAMUSCULAR | Status: AC
  Administered 2012-06-11: 100 mg via INTRAMUSCULAR

## 2012-06-11 MED ORDER — IOHEXOL 180 MG/ML  SOLN: 1.0000 mL | Freq: Once | INTRAMUSCULAR | Status: AC | PRN

## 2012-06-11 NOTE — Telephone Encounter (Signed)
Pt called yesterday with a positional headache and it is no better after more bedrest. Dr. Temple Pacini office called for order for blood patch. Pt will be done today per Dr. Benard Rink.

## 2012-06-11 NOTE — Progress Notes (Signed)
Cold pack to neck and pain injection given post blood patch.

## 2012-06-11 NOTE — Progress Notes (Signed)
Blood drawn from left AC. 20 cc obtained, site unremarkable, pt tolerated well. Pt is in severe pain with her head and back pain.

## 2012-06-11 NOTE — Progress Notes (Signed)
Pt felt some better post blood patch at time of discharge.

## 2012-07-01 ENCOUNTER — Encounter (HOSPITAL_BASED_OUTPATIENT_CLINIC_OR_DEPARTMENT_OTHER): Payer: Self-pay | Admitting: Emergency Medicine

## 2012-07-01 ENCOUNTER — Emergency Department (HOSPITAL_BASED_OUTPATIENT_CLINIC_OR_DEPARTMENT_OTHER)
Admission: EM | Admit: 2012-07-01 | Discharge: 2012-07-01 | Disposition: A | Discharge status: Home / Self Care | Payer: BC Managed Care – PPO | Attending: Emergency Medicine | Admitting: Emergency Medicine

## 2012-07-01 ENCOUNTER — Emergency Department (HOSPITAL_BASED_OUTPATIENT_CLINIC_OR_DEPARTMENT_OTHER): Payer: BC Managed Care – PPO

## 2012-07-01 DIAGNOSIS — Z791 Long term (current) use of non-steroidal anti-inflammatories (NSAID): Secondary | ICD-10-CM | POA: Insufficient documentation

## 2012-07-01 DIAGNOSIS — F411 Generalized anxiety disorder: Secondary | ICD-10-CM | POA: Insufficient documentation

## 2012-07-01 DIAGNOSIS — R42 Dizziness and giddiness: Secondary | ICD-10-CM | POA: Insufficient documentation

## 2012-07-01 DIAGNOSIS — W11XXXA Fall on and from ladder, initial encounter: Secondary | ICD-10-CM | POA: Insufficient documentation

## 2012-07-01 DIAGNOSIS — Z8739 Personal history of other diseases of the musculoskeletal system and connective tissue: Secondary | ICD-10-CM | POA: Insufficient documentation

## 2012-07-01 DIAGNOSIS — S0993XA Unspecified injury of face, initial encounter: Secondary | ICD-10-CM | POA: Insufficient documentation

## 2012-07-01 DIAGNOSIS — H538 Other visual disturbances: Secondary | ICD-10-CM | POA: Insufficient documentation

## 2012-07-01 DIAGNOSIS — Y9289 Other specified places as the place of occurrence of the external cause: Secondary | ICD-10-CM | POA: Insufficient documentation

## 2012-07-01 DIAGNOSIS — I10 Essential (primary) hypertension: Secondary | ICD-10-CM | POA: Insufficient documentation

## 2012-07-01 DIAGNOSIS — Y9389 Activity, other specified: Secondary | ICD-10-CM | POA: Insufficient documentation

## 2012-07-01 DIAGNOSIS — W1809XA Striking against other object with subsequent fall, initial encounter: Secondary | ICD-10-CM | POA: Insufficient documentation

## 2012-07-01 DIAGNOSIS — Z8742 Personal history of other diseases of the female genital tract: Secondary | ICD-10-CM | POA: Insufficient documentation

## 2012-07-01 DIAGNOSIS — Z8679 Personal history of other diseases of the circulatory system: Secondary | ICD-10-CM | POA: Insufficient documentation

## 2012-07-01 DIAGNOSIS — Z79899 Other long term (current) drug therapy: Secondary | ICD-10-CM | POA: Insufficient documentation

## 2012-07-01 DIAGNOSIS — F0781 Postconcussional syndrome: Secondary | ICD-10-CM | POA: Insufficient documentation

## 2012-07-01 DIAGNOSIS — R110 Nausea: Secondary | ICD-10-CM | POA: Insufficient documentation

## 2012-07-01 DIAGNOSIS — Z792 Long term (current) use of antibiotics: Secondary | ICD-10-CM | POA: Insufficient documentation

## 2012-07-01 DIAGNOSIS — Z87891 Personal history of nicotine dependence: Secondary | ICD-10-CM | POA: Insufficient documentation

## 2012-07-01 DIAGNOSIS — S025XXA Fracture of tooth (traumatic), initial encounter for closed fracture: Secondary | ICD-10-CM | POA: Insufficient documentation

## 2012-07-01 HISTORY — DX: Unspecified thoracic, thoracolumbar and lumbosacral intervertebral disc disorder: M51.9

## 2012-07-01 MED ORDER — OXYCODONE-ACETAMINOPHEN 5-325 MG PO TABS
1.0000 | ORAL_TABLET | Freq: Once | ORAL | Status: AC
  Administered 2012-07-01: 1 via ORAL
  Filled 2012-07-01 (×2): qty 1

## 2012-07-01 MED ORDER — OXYCODONE-ACETAMINOPHEN 5-325 MG PO TABS: 1.0000 | ORAL_TABLET | ORAL | Status: DC | PRN

## 2012-07-01 MED ORDER — METOCLOPRAMIDE HCL 10 MG PO TABS: 10.0000 mg | ORAL_TABLET | Freq: Four times a day (QID) | ORAL | Status: DC | PRN

## 2012-07-01 MED ORDER — IBUPROFEN 600 MG PO TABS: 600.0000 mg | ORAL_TABLET | Freq: Four times a day (QID) | ORAL | Status: DC | PRN

## 2012-07-01 MED ORDER — KETOROLAC TROMETHAMINE 60 MG/2ML IM SOLN
60.0000 mg | Freq: Once | INTRAMUSCULAR | Status: AC
  Administered 2012-07-01: 60 mg via INTRAMUSCULAR
  Filled 2012-07-01: qty 2

## 2012-07-01 MED ORDER — METOCLOPRAMIDE HCL 10 MG PO TABS
10.0000 mg | ORAL_TABLET | Freq: Once | ORAL | Status: AC
  Administered 2012-07-01: 10 mg via ORAL
  Filled 2012-07-01: qty 1

## 2012-07-01 NOTE — ED Notes (Signed)
Pt fell backwards off ladder yesterday and hit back of head on hardwood floor. Pt states today she has had some dizziness and headache. Pt also reports front tooth is loose.

## 2012-07-01 NOTE — ED Notes (Signed)
Pt returned from CT-report from CT that pt passed out when she sat up on CT table-when [facility] called-arrived to find pt alert/oriented,skin W&D-c/o cont'd pain to back head

## 2012-07-01 NOTE — ED Notes (Signed)
Patient transported to CT 

## 2012-07-01 NOTE — ED Provider Notes (Signed)
History    CSN: [id number] Arrival date & time 07/01/12  1908  First MD Initiated Contact with Patient 07/01/12 2054     Chief Complaint  Patient presents with  . Fall  . Headache  . Dental Injury   (Consider location/radiation/quality/duration/timing/severity/associated sxs/prior Treatment) HPI Pt states she fell from ladder several step up backward yesterday. +HA and neck pain. No LOC. Pt c/o HA, blurred vision, dizziness, nausea since fall. She is concerned that several of her lower teeth may be loose. No focal weakness or numbness.  Past Medical History  Diagnosis Date  . Hypertension   . Migraines   . Breast pain, right   . Anxiety   . Ruptured disk    Past Surgical History  Procedure Laterality Date  . Spine surgery  72011    L2-L5 stabilization  . Cervical cone biopsy    . Knee arthroscopy  07/1996  . Carpal tunnel release      right wrist  . Breast surgery      lumpectomy in 20s for benign mass with implant placement (subglandular)   Family History  Problem Relation Age of Onset  . Diabetes Mother   . Glaucoma Mother   . Cancer Mother     breast, ovarian  . Heart disease Mother   . Gout Father   . Cancer Sister    History  Substance Use Topics  . Smoking status: Former Smoker -- 1 years    Types: Cigarettes    Quit date: 01/08/1976  . Smokeless tobacco: Not on file  . Alcohol Use: No   OB History   Grav Para Term Preterm Abortions TAB SAB Ect Mult Living                 Review of Systems  Constitutional: Negative for fever and chills.  HENT: Positive for neck pain and dental problem.   Eyes: Positive for visual disturbance.  Respiratory: Negative for cough and shortness of breath.   Cardiovascular: Negative for chest pain.  Gastrointestinal: Positive for nausea. Negative for vomiting and abdominal pain.  Musculoskeletal: Negative for back pain.  Skin: Negative for rash and wound.  Neurological: Positive for dizziness, light-headedness and  headaches. Negative for syncope, weakness and numbness.  All other systems reviewed and are negative.    Allergies  Shellfish allergy; Effexor; Flexeril; Nubain; Iodine; Codeine; and Erythromycin  Home Medications   Current Outpatient Rx  Name  Route  Sig  Dispense  Refill  . atenolol (TENORMIN) 100 MG tablet   Oral   Take 1 tablet (100 mg total) by mouth daily.   30 tablet   5   . buPROPion (WELLBUTRIN XL) 150 MG 24 hr tablet   Oral   Take 3 tablets (450 mg total) by mouth daily.   90 tablet   5   . clonazePAM (KLONOPIN) 0.5 MG tablet   Oral   Take 1 tablet (0.5 mg total) by mouth 2 (two) times daily as needed for anxiety.   60 tablet   0   . ketorolac (TORADOL) 10 MG tablet      TAKE 1 TABLET BY MOUTH EVERY 6 HOURS AS NEEDED   20 tablet   5   . metaxalone (SKELAXIN) 800 MG tablet   Oral   Take 1 tablet (800 mg total) by mouth 3 (three) times daily as needed for pain.   90 tablet   3   . oxyCODONE-acetaminophen (PERCOCET) 10-325 MG per tablet   Oral  Take 1 tablet by mouth every 6 (six) hours as needed.         Marland Kitchen PARoxetine (PAXIL) 40 MG tablet   Oral   Take 1 tablet (40 mg total) by mouth daily.   30 tablet   5   . promethazine (PHENERGAN) 12.5 MG tablet      TAKE 1 TABLET BY MOUTH EVERY 6 HOURS AS NEEDED   30 tablet   5   . topiramate (TOPAMAX) 100 MG tablet   Oral   Take 1 tablet (100 mg total) by mouth 4 (four) times daily.   120 tablet   0   . valACYclovir (VALTREX) 1000 MG tablet   Oral   Take 1 tablet (1,000 mg total) by mouth as needed.   30 tablet   5   . amoxicillin (AMOXIL) 875 MG tablet   Oral   Take 1 tablet (875 mg total) by mouth 2 (two) times daily.   20 tablet   0   . ibuprofen (ADVIL,MOTRIN) 600 MG tablet   Oral   Take 1 tablet (600 mg total) by mouth every 6 (six) hours as needed for pain.   30 tablet   0   . metoCLOPramide (REGLAN) 10 MG tablet   Oral   Take 1 tablet (10 mg total) by mouth every 6 (six) hours  as needed (nausea/headache).   6 tablet   0   . oxyCODONE-acetaminophen (PERCOCET) 5-325 MG per tablet   Oral   Take 1 tablet by mouth every 4 (four) hours as needed for pain.   10 tablet   0    BP 148/86  Pulse 70  Temp(Src) 98.7 F (37.1 C) (Oral)  Resp 18  Ht 5\' 8"  (1.727 m)  Wt 257 lb (116.574 kg)  BMI 39.09 kg/m2  SpO2 98% Physical Exam  Nursing note and vitals reviewed. Constitutional: She is oriented to person, place, and time. She appears well-developed and well-nourished. No distress.  HENT:  Head: Normocephalic and atraumatic.  Mouth/Throat: Oropharynx is clear and moist.  No obvious dental fractures or loosened teeth.   Eyes: EOM are normal. Pupils are equal, round, and reactive to light.  Neck: Normal range of motion. Neck supple.  +mild midline cervical TTP. No step offs.   Cardiovascular: Normal rate and regular rhythm.   Pulmonary/Chest: Effort normal and breath sounds normal. No respiratory distress. She has no wheezes. She has no rales.  Abdominal: Soft. Bowel sounds are normal. She exhibits no distension and no mass. There is no tenderness. There is no rebound and no guarding.  Musculoskeletal: Normal range of motion. She exhibits no edema and no tenderness.  Neurological: She is alert and oriented to person, place, and time.  5/5 motor in all ext, sensation intact.   Skin: Skin is warm and dry. No rash noted. No erythema.  Psychiatric: She has a normal mood and affect. Her behavior is normal.    ED Course  Procedures (including critical care time) Labs Reviewed - No data to display Ct Head Wo Contrast  07/01/2012   *RADIOLOGY REPORT*  Clinical Data: Headache after fall  CT HEAD WITHOUT CONTRAST  Technique:  Contiguous axial images were obtained from the base of the skull through the vertex without contrast.  Comparison: July 03, 2010.  Findings: No mass effect or midline shift is noted.  Ventricular size is within normal limits.  There is no evidence of  mass lesion, hemorrhage or acute infarction. Bony calvarium appears intact.  IMPRESSION:  No gross intracranial abnormality seen.   Original Report Authenticated By: Lupita Raider.,  M.D.   Ct Cervical Spine Wo Contrast  07/01/2012   *RADIOLOGY REPORT*  Clinical Data: Fall  CT CERVICAL SPINE WITHOUT CONTRAST  Technique:  Multidetector CT imaging of the cervical spine was performed. Multiplanar CT image reconstructions were also generated.  Comparison: None.  Findings: Normal alignment with mild cervical kyphosis.  Mild disc degeneration and spurring at C4-5 and C5-6.  Negative for fracture.  IMPRESSION: Negative for fracture.   Original Report Authenticated By: Janeece Riggers, M.D.   1. Postconcussive syndrome     MDM  Pt states she is feeling better. Near syncope while sitting up in CT. No trauma. No neuro deficits. Pt with post concussive symptoms. Return precautions given.   Loren Racer, MD 07/01/12 [phone]

## 2012-08-19 ENCOUNTER — Other Ambulatory Visit: Payer: Self-pay | Admitting: Physician Assistant

## 2012-10-15 ENCOUNTER — Other Ambulatory Visit: Payer: Self-pay

## 2012-10-15 DIAGNOSIS — Z1231 Encounter for screening mammogram for malignant neoplasm of breast: Secondary | ICD-10-CM

## 2012-10-15 DIAGNOSIS — Z853 Personal history of malignant neoplasm of breast: Secondary | ICD-10-CM

## 2012-10-15 DIAGNOSIS — Z9882 Breast implant status: Secondary | ICD-10-CM

## 2012-11-09 ENCOUNTER — Ambulatory Visit: Payer: BC Managed Care – PPO

## 2012-11-16 ENCOUNTER — Other Ambulatory Visit: Payer: Self-pay | Admitting: Physician Assistant

## 2012-12-02 ENCOUNTER — Ambulatory Visit
Admission: RE | Admit: 2012-12-02 | Discharge: 2012-12-02 | Disposition: A | Discharge status: Home / Self Care | Payer: BC Managed Care – PPO | Source: Ambulatory Visit

## 2012-12-02 DIAGNOSIS — Z1231 Encounter for screening mammogram for malignant neoplasm of breast: Secondary | ICD-10-CM

## 2012-12-02 DIAGNOSIS — Z9882 Breast implant status: Secondary | ICD-10-CM

## 2012-12-02 DIAGNOSIS — Z853 Personal history of malignant neoplasm of breast: Secondary | ICD-10-CM

## 2012-12-08 ENCOUNTER — Other Ambulatory Visit: Payer: Self-pay | Admitting: Physician Assistant

## 2012-12-08 DIAGNOSIS — R928 Other abnormal and inconclusive findings on diagnostic imaging of breast: Secondary | ICD-10-CM

## 2012-12-15 ENCOUNTER — Ambulatory Visit
Admission: RE | Admit: 2012-12-15 | Discharge: 2012-12-15 | Disposition: A | Discharge status: Home / Self Care | Payer: BC Managed Care – PPO | Source: Ambulatory Visit | Attending: Physician Assistant | Admitting: Physician Assistant

## 2012-12-15 DIAGNOSIS — R928 Other abnormal and inconclusive findings on diagnostic imaging of breast: Secondary | ICD-10-CM

## 2013-01-22 ENCOUNTER — Other Ambulatory Visit: Payer: Self-pay | Admitting: Physician Assistant

## 2013-03-30 ENCOUNTER — Other Ambulatory Visit: Payer: Self-pay | Admitting: Neurosurgery

## 2013-03-30 DIAGNOSIS — M47817 Spondylosis without myelopathy or radiculopathy, lumbosacral region: Secondary | ICD-10-CM

## 2013-09-17 DIAGNOSIS — Z0271 Encounter for disability determination: Secondary | ICD-10-CM

## 2013-09-23 ENCOUNTER — Ambulatory Visit (INDEPENDENT_AMBULATORY_CARE_PROVIDER_SITE_OTHER): Admitting: Family Medicine

## 2013-09-23 VITALS — BP 140/90 | HR 85 | Temp 98.7°F | Resp 16 | Ht 68.0 in | Wt 247.0 lb

## 2013-09-23 DIAGNOSIS — E669 Obesity, unspecified: Secondary | ICD-10-CM

## 2013-09-23 DIAGNOSIS — G43019 Migraine without aura, intractable, without status migrainosus: Secondary | ICD-10-CM

## 2013-09-23 DIAGNOSIS — I10 Essential (primary) hypertension: Secondary | ICD-10-CM

## 2013-09-23 LAB — CBC
HEMATOCRIT: 39.7 % (ref 36.0–46.0)
Hemoglobin: 13.7 g/dL (ref 12.0–15.0)
MCH: 29.3 pg (ref 26.0–34.0)
MCHC: 34.5 g/dL (ref 30.0–36.0)
MCV: 85 fL (ref 78.0–100.0)
Platelets: 267 10*3/uL (ref 150–400)
RBC: 4.67 MIL/uL (ref 3.87–5.11)
RDW: 14.5 % (ref 11.5–15.5)
WBC: 5.6 10*3/uL (ref 4.0–10.5)

## 2013-09-23 MED ORDER — ATENOLOL 50 MG PO TABS: ORAL_TABLET | ORAL | Status: DC

## 2013-09-23 MED ORDER — TOPIRAMATE 25 MG PO TABS: ORAL_TABLET | ORAL | Status: DC

## 2013-09-23 MED ORDER — KETOROLAC TROMETHAMINE 30 MG/ML IJ SOLN
30.0000 mg | Freq: Once | INTRAMUSCULAR | Status: AC
  Administered 2013-09-23: 30 mg via INTRAMUSCULAR

## 2013-09-23 NOTE — Patient Instructions (Addendum)
We are going to start you back on atenolol for your blood pressure- start with 50mg  once a day and we will see if this is enough for you.    For your headaches, start back on topamax 25mg  at bedtime.  You can increase to 50 mg twice a day- go up by 25 mg WEEKLY as needed.    Please come and see [place] in about one month to check your progress- sooner if you do not improve.  I will be in touch with your labs.    Remember that topamax can make you sleepy, especially in conjunction with percocet and/ or skelaxin.  Alert your pain doctor that you are taking topamax to make sure it is ok with him- I woul call before you fill it.

## 2013-09-23 NOTE — Progress Notes (Addendum)
[facility] [address] [place]  [address] [phone]  Date:  09/23/2013   Name:  Sherri [name]   DOB:  01-Jun-1955   MRN:  [id number]  PCP:  JEFFERY,CHELLE, PA-C    Chief Complaint: Migraine   History of Present Illness:  Sherri [name] is a 58 y.o. very pleasant female patient who presents with the following:  Last seen in our office about 18 months ago.  She brings with her a note from Dr. Sheralyn Boatman from about a month ago stating that her BP was 182/105, P 106.   She states she has had migraines "since I was 58 years old, for the last 4 months they have been every day."   She also sees a doctor in [place] who is helping her with "bulging discs and ruptured discs" in her back.  She is taking percocet for her back.  She states that she is under a lot of stress.  She has been out of her atenolol for about one month.  This did seem to help with her BP  She is taking #6 percocet 10 a day, skelaxin.   We had given her topamax for migraine prevention last 01/2012- she did think that it helped.  She stopped taking it shortly thereafter due to insurance issues.   She would like to have something for her HA now- perhaps toradol.  She has not had any NSAIDS so far today.  She understands that I cannot give he further narcotics    BP Readings from Last 3 Encounters:  09/23/13 140/90  07/01/12 148/86  06/08/12 180/86   Pulse Readings from Last 3 Encounters:  09/23/13 85  07/01/12 70  06/08/12 87      Patient Active Problem List   Diagnosis Date Noted  . Migraine 02/14/2011  . Obesity 02/14/2011  . 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 Blindness of right eye 02/14/2011  . Depression 02/14/2011    Past Medical History  Diagnosis Date  . Hypertension   . Migraines   . Breast pain, right   . Anxiety   . Ruptured disk     Past Surgical History  Procedure Laterality Date  . Spine surgery  72011    L2-L5 stabilization  .  Cervical cone biopsy    . Knee arthroscopy  07/1996  . Carpal tunnel release      right wrist  . Breast surgery      lumpectomy in 20s for benign mass with implant placement (subglandular)    History  Substance Use Topics  . Smoking status: Former Smoker -- 1 years    Types: Cigarettes    Quit date: 01/08/1976  . Smokeless tobacco: Not on file  . Alcohol Use: No    Family History  Problem Relation Age of Onset  . Diabetes Mother   . Glaucoma Mother   . Cancer Mother     breast, ovarian  . Heart disease Mother   . Gout Father   . Cancer Sister     Allergies  Allergen Reactions  . Shellfish Allergy Anaphylaxis  . Effexor [Venlafaxine Hydrochloride] Other (See Comments)    HALLUCINATIONS  . Flexeril [Cyclobenzaprine Hcl] Other (See Comments)    HALLUCINATIONS AND "OUT OF CONTROL"  . Nubain [Nalbuphine Hcl] Other (See Comments)    HALLUCINATIONS  . Iodine   . Codeine Swelling and Rash  . Erythromycin Rash    Medication list has been reviewed and updated.  Current  Outpatient Prescriptions on File Prior to Visit  Medication Sig Dispense Refill  . amoxicillin (AMOXIL) 875 MG tablet Take 1 tablet (875 mg total) by mouth 2 (two) times daily.  20 tablet  0  . atenolol (TENORMIN) 100 MG tablet take 1 tablet by mouth once daily  30 tablet  5  . ibuprofen (ADVIL,MOTRIN) 600 MG tablet Take 1 tablet (600 mg total) by mouth every 6 (six) hours as needed for pain.  30 tablet  0  . ketorolac (TORADOL) 10 MG tablet TAKE 1 TABLET BY MOUTH EVERY 6 HOURS AS NEEDED  20 tablet  5  . metaxalone (SKELAXIN) 800 MG tablet Take 1 tablet (800 mg total) by mouth 3 (three) times daily as needed for pain.  90 tablet  3  . metoCLOPramide (REGLAN) 10 MG tablet Take 1 tablet (10 mg total) by mouth every 6 (six) hours as needed (nausea/headache).  6 tablet  0  . oxyCODONE-acetaminophen (PERCOCET) 10-325 MG per tablet Take 1 tablet by mouth every 6 (six) hours as needed.      Marland Kitchen oxyCODONE-acetaminophen  (PERCOCET) 5-325 MG per tablet Take 1 tablet by mouth every 4 (four) hours as needed for pain.  10 tablet  0  . PARoxetine (PAXIL) 40 MG tablet Take 1 tablet (40 mg total) by mouth daily. PATIENT NEEDS OFFICE VISIT FOR ADDITIONAL REFILLS  30 tablet  0  . promethazine (PHENERGAN) 12.5 MG tablet TAKE 1 TABLET BY MOUTH EVERY 6 HOURS AS NEEDED  30 tablet  5  . valACYclovir (VALTREX) 1000 MG tablet TAKE 1 TABLET BY MOUTH DAILY AS NEEDED  30 tablet  5  . buPROPion (WELLBUTRIN XL) 150 MG 24 hr tablet Take 3 tablets (450 mg total) by mouth daily.  90 tablet  5  . clonazePAM (KLONOPIN) 0.5 MG tablet Take 1 tablet (0.5 mg total) by mouth 2 (two) times daily as needed for anxiety.  60 tablet  0  . topiramate (TOPAMAX) 100 MG tablet Take 1 tablet (100 mg total) by mouth 4 (four) times daily.  120 tablet  0   No current facility-administered medications on file prior to visit.    Review of Systems:  As per HPI- otherwise negative.   Physical Examination: Filed Vitals:   09/23/13 1244  BP: 140/90  Pulse: 85  Temp: 98.7 F (37.1 C)  Resp: 16   Filed Vitals:   09/23/13 1244  Height: 5\' 8"  (1.727 m)  Weight: 247 lb (112.038 kg)   Body mass index is 37.56 kg/(m^2). Ideal Body Weight: Weight in (lb) to have BMI = 25: 164.1  GEN: WDWN, NAD, Non-toxic, A & O x 3, obese, wearing sunglasses and squinting HEENT: Atraumatic, Normocephalic. Neck supple. No masses, No LAD.  Bilateral TM wnl, oropharynx normal.  PEERL,EOMI.   Ears and Nose: No external deformity. CV: RRR, No M/G/R. No JVD. No thrill. No extra heart sounds. PULM: CTA B, no wheezes, crackles, rhonchi. No retractions. No resp. distress. No accessory muscle use. ABD: S, NT, ND, +BS. No rebound. No HSM. EXTR: No c/c/e NEURO Normal gait. Normal strength, sensation and DTR all extremities.  Normal romberg PSYCH: Normally interactive. Conversant. Not depressed or anxious appearing.  Calm demeanor.    Assessment and Plan: Intractable  migraine without aura and without status migrainosus - Plan: topiramate (TOPAMAX) 25 MG tablet, CBC, Comprehensive metabolic panel, ketorolac (TORADOL) 30 MG/ML injection 30 mg  Essential hypertension - Plan: atenolol (TENORMIN) 50 MG tablet, CBC, Comprehensive metabolic panel  Obesity, unspecified  Long history of migraine HA, here today with complaint of daily migraine for 4 months. She is on a substantial dose of chronic percocet for back pain.  Did give he a shot of toradol today.  Will start back on topamax and taper dose up gradually as tolerated.  Also start back on atenolol that she has used for her BP  She declines any imaging today- however states that she does plan to have an MRI of her head the same time they do an MRI of her back in a month or so.  She will seek care if her HA does not get better with shot  Signed Lamar Blinks, MD  9/18 addnd:called to go over labs.  LMOM, please let me know if not better  Results for orders placed in visit on 09/23/13  CBC      Result Value Ref Range   WBC 5.6  4.0 - 10.5 K/uL   RBC 4.67  3.87 - 5.11 MIL/uL   Hemoglobin 13.7  12.0 - 15.0 g/dL   HCT 39.7  36.0 - 46.0 %   MCV 85.0  78.0 - 100.0 fL   MCH 29.3  26.0 - 34.0 pg   MCHC 34.5  30.0 - 36.0 g/dL   RDW 14.5  11.5 - 15.5 %   Platelets 267  150 - 400 K/uL  COMPREHENSIVE METABOLIC PANEL      Result Value Ref Range   Sodium 139  135 - 145 mEq/L   Potassium 4.8  3.5 - 5.3 mEq/L   Chloride 103  96 - 112 mEq/L   CO2 27  19 - 32 mEq/L   Glucose, Bld 89  70 - 99 mg/dL   BUN 11  6 - 23 mg/dL   Creat 0.50  0.50 - 1.10 mg/dL   Total Bilirubin 0.4  0.2 - 1.2 mg/dL   Alkaline Phosphatase 67  39 - 117 U/L   AST 42 (*) 0 - 37 U/L   ALT 28  0 - 35 U/L   Total Protein 6.9  6.0 - 8.3 g/dL   Albumin 4.3  3.5 - 5.2 g/dL   Calcium 9.3  8.4 - 10.5 mg/dL

## 2013-09-24 ENCOUNTER — Encounter: Payer: Self-pay | Admitting: Family Medicine

## 2013-09-24 LAB — COMPREHENSIVE METABOLIC PANEL
ALT: 28 U/L (ref 0–35)
AST: 42 U/L — ABNORMAL HIGH (ref 0–37)
Albumin: 4.3 g/dL (ref 3.5–5.2)
Alkaline Phosphatase: 67 U/L (ref 39–117)
BUN: 11 mg/dL (ref 6–23)
CALCIUM: 9.3 mg/dL (ref 8.4–10.5)
CHLORIDE: 103 meq/L (ref 96–112)
CO2: 27 meq/L (ref 19–32)
CREATININE: 0.5 mg/dL (ref 0.50–1.10)
Glucose, Bld: 89 mg/dL (ref 70–99)
Potassium: 4.8 mEq/L (ref 3.5–5.3)
Sodium: 139 mEq/L (ref 135–145)
Total Bilirubin: 0.4 mg/dL (ref 0.2–1.2)
Total Protein: 6.9 g/dL (ref 6.0–8.3)

## 2014-02-09 ENCOUNTER — Ambulatory Visit (INDEPENDENT_AMBULATORY_CARE_PROVIDER_SITE_OTHER): Admitting: Physician Assistant

## 2014-02-09 VITALS — BP 180/104 | HR 62 | Temp 98.8°F | Resp 18 | Ht 67.0 in | Wt 251.4 lb

## 2014-02-09 DIAGNOSIS — N6081 Other benign mammary dysplasias of right breast: Secondary | ICD-10-CM

## 2014-02-09 DIAGNOSIS — I10 Essential (primary) hypertension: Secondary | ICD-10-CM

## 2014-02-09 DIAGNOSIS — B009 Herpesviral infection, unspecified: Secondary | ICD-10-CM

## 2014-02-09 MED ORDER — VALACYCLOVIR HCL 1 G PO TABS: 1000.0000 mg | ORAL_TABLET | Freq: Every day | ORAL | Status: DC | PRN

## 2014-02-09 MED ORDER — ATENOLOL 50 MG PO TABS: ORAL_TABLET | ORAL | Status: DC

## 2014-02-09 MED ORDER — LISINOPRIL 10 MG PO TABS: 10.0000 mg | ORAL_TABLET | Freq: Every day | ORAL | Status: DC

## 2014-02-09 NOTE — Progress Notes (Signed)
Subjective:    Patient ID: Sherri [name], female    DOB: 1955-06-30, 58 y.o.   MRN: [id number]   PCP: Leva Baine, PA-C  Chief Complaint  Patient presents with  . Hypertension    Since March 2015 - Atenolol does not help  . Nevus    on Rt Breast - noticed 5 days ago  . Medication Refill    valtrex    Allergies  Allergen Reactions  . Shellfish Allergy Anaphylaxis  . Effexor [Venlafaxine Hydrochloride] Other (See Comments)    HALLUCINATIONS  . Flexeril [Cyclobenzaprine Hcl] Other (See Comments)    HALLUCINATIONS AND "OUT OF CONTROL"  . Nubain [Nalbuphine Hcl] Other (See Comments)    HALLUCINATIONS  . Iodine   . Codeine Swelling and Rash  . Erythromycin Rash    Patient Active Problem List   Diagnosis Date Noted  . Intractable migraine without aura and without status migrainosus 09/23/2013  . Essential hypertension 09/23/2013  . Obesity, unspecified 09/23/2013  . Migraine 02/14/2011  . Obesity 02/14/2011  . 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 Blindness of right eye 02/14/2011  . Depression 02/14/2011    Prior to Admission medications   Medication Sig Start Date End Date Taking? Authorizing Provider  atenolol (TENORMIN) 50 MG tablet take 1 tablet by mouth once daily 09/23/13  Yes Jessica C Copland, MD  DULoxetine (CYMBALTA) 30 MG capsule Take 90 mg by mouth daily.   Yes Historical Provider, MD  metaxalone (SKELAXIN) 800 MG tablet Take 1 tablet (800 mg total) by mouth 3 (three) times daily as needed for pain. 02/27/12  Yes Thersia Petraglia S Harlee Pursifull, PA-C  oxyCODONE-acetaminophen (PERCOCET) 10-325 MG per tablet Take 1 tablet by mouth every 6 (six) hours as needed.   Yes Historical Provider, MD  topiramate (TOPAMAX) 25 MG tablet Start with 25 mg at bedtime.  May increase to 50 mg twice a day 09/23/13  Yes Gay Filler Copland, MD  valACYclovir (VALTREX) 1000 MG tablet TAKE 1 TABLET BY MOUTH DAILY AS NEEDED 08/19/12  Yes Collene Leyden, PA-C    Butalbital-APAP-Caffeine 50-300-40 MG CAPS  01/26/14   Historical Provider, MD  meloxicam (MOBIC) 7.5 MG tablet Take 7.5 mg by mouth daily. 01/20/14   Historical Provider, MD  mirtazapine (REMERON) 30 MG tablet Take 30 mg by mouth at bedtime.    Historical Provider, MD    Medical, Surgical, Family and Social History reviewed and updated.  HPI  1. BP has been elevated. Has been having headaches. No double vision, dizziness. Uses readers, and notes that they aren't strong enough, but no difficulty with far vision. No chest pain or shortness of breath. Her psychiatrist and back specialist told her it was time to have it addressed. Uses atenolol for treatment of migraine headache, and has had to increase the treatment of HA with oxycodone and butalbital (prescribed by her back specialist, Dr. Carloyn Manner). In addition to the atenolol, she is on topiramate 25 mg, 2 tablets at HS for migraine prevention.  Blood pressure readings from Dr. Tomasita Crumble McKinney's office: 03/08/13 173/80 P62 04/27/13 181/99 P86 08/22/13 182/105 P106 09/28/13 181/93 P 81 11/09/13 177/100 P 78 02/09/14 181/93 P 74   2. Needs refill of Valtrex. Episodic treatment of HSV type 2.  3. Lesion on the RIGHT breast at the site of a previously excised mole. She noted it 5 days ago. Pathology of the original mole was benign. She's really worried that it may be cancer. Non-tender.  Her stress level continues to  be quite high. Recall that she was widowed about 2 years ago. Her husband's ex-wife is living in a home he owned and paid for for almost 30 years. Upon his death, the woman expected his wife to continue paying the mortgage and taxes, upkeep, etc. There are also multiple leans against the property. They've been in heated dispute since the husband's death. The property is now in foreclosure. The patient is "on my third attorney," in her attempt to settle the estate. Dr. Caprice Beaver saw her this morning and increased the Cymbalta dose from 60 mg  to 90 mg daily, and added Mirtazepine 30 mg at HS to help with insomnia. She is to follow-up on 04/13/2014.   Review of Systems As above.    Objective:   Physical Exam  Constitutional: She is oriented to person, place, and time. She appears well-developed and well-nourished. She is active and cooperative. No distress.  BP 180/104 mmHg  Pulse 62  Temp(Src) 98.8 F (37.1 C) (Oral)  Resp 18  Ht 5\' 7"  (1.702 m)  Wt 251 lb 6.4 oz (114.034 kg)  BMI 39.37 kg/m2  SpO2 99%   HENT:  Head: Normocephalic and atraumatic.  Right Ear: Hearing, tympanic membrane, external ear and ear canal normal.  Left Ear: Hearing, tympanic membrane, external ear and ear canal normal.  Nose: Nose normal.  Mouth/Throat: Uvula is midline, oropharynx is clear and moist and mucous membranes are normal.  Eyes: Conjunctivae, EOM and lids are normal. Pupils are equal, round, and reactive to light.  Fundoscopic exam:      The right eye shows no hemorrhage and no papilledema. The right eye shows red reflex.       The left eye shows no hemorrhage and no papilledema. The left eye shows red reflex.  Neck: Normal range of motion, full passive range of motion without pain and phonation normal. Neck supple. No thyroid mass and no thyromegaly present.  Cardiovascular: Normal rate, regular rhythm and normal heart sounds.   Pulses:      Radial pulses are 2+ on the right side, and 2+ on the left side.  Pulmonary/Chest: Effort normal and breath sounds normal.    Neurological: She is alert and oriented to person, place, and time. She has normal strength. No cranial nerve deficit or sensory deficit.  Reflex Scores:      Bicep reflexes are 2+ on the right side and 2+ on the left side.      Patellar reflexes are 2+ on the right side and 2+ on the left side. Skin: Skin is warm and dry. Lesion (see above) noted.  Psychiatric: Her speech is normal and behavior is normal. Her mood appears anxious. Her affect is not inappropriate. She  exhibits a depressed mood.          Assessment & Plan:  1. Essential hypertension Continue the atenolol. Add lisinopril. Anticipatory guidance. Reassess in 4-6 weeks. - lisinopril (PRINIVIL,ZESTRIL) 10 MG tablet; Take 1 tablet (10 mg total) by mouth daily.  Dispense: 30 tablet; Refill: 5 - atenolol (TENORMIN) 50 MG tablet; take 1 tablet by mouth once daily  Dispense: 30 tablet; Refill: 5  2. Sebaceous cyst of breast, right Schedule excision on 03/08/2014.  3. HSV-2 (herpes simplex virus 2) infection Refill Valtrex. 1 PO QD x 5 days PRN outbreak. - valACYclovir (VALTREX) 1000 MG tablet; Take 1 tablet (1,000 mg total) by mouth daily as needed (herpes outbreak).  Dispense: 30 tablet; Refill: 5  Once she's stable on the increased cymbalta  dose and the new medication for sleep, I enourage her to increase the topiramate dose by adding 25 mg each morning, then increasing to 50 mg BID for improved HA prevention.  Fara Chute, PA-C Physician Assistant-Certified [facility]

## 2014-02-09 NOTE — Patient Instructions (Signed)
Once you have increased the Cymbalta to the new dose and started the new medication for sleep, and are stable on them (in 1-2 weeks), add a topiramate 25 mg each morning (continue the two 25 mg at bedtime). After a week, if you are tolerating it, you may increase the morning dose to two 25 mg tablets. That may help reduce your headaches as well.

## 2014-03-08 ENCOUNTER — Ambulatory Visit: Admitting: Physician Assistant

## 2014-03-29 ENCOUNTER — Ambulatory Visit: Admitting: Physician Assistant

## 2014-08-09 ENCOUNTER — Other Ambulatory Visit: Payer: Self-pay | Admitting: Orthopaedic Surgery

## 2014-08-26 NOTE — H&P (Signed)
Sherri [name] is an 59 y.o. female.   Chief Complaint: Right hand numbness or tingling HPI: Sherri [name] is a patient well known to our practice who has had many months of right hand numbness and tingling mainly thumb index and long finger.  She does a lot of work requiring manual dexterity which I believe has caused carpal tunnel syndrome.  A recent EMG nerve conduction study positive for right-sided carpal tunnel syndrome.  We have discussed treatment options that being release of the carpal tunnel surgically in the operating room.  Past Medical History  Diagnosis Date  . Hypertension   . Migraines   . Breast pain, right   . Anxiety   . Ruptured disk     Past Surgical History  Procedure Laterality Date  . Spine surgery  72011    L2-L5 stabilization  . Cervical cone biopsy    . Knee arthroscopy  07/1996  . Carpal tunnel release      right wrist  . Breast surgery      lumpectomy in 20s for benign mass with implant placement (subglandular)    Family History  Problem Relation Age of Onset  . Diabetes Mother   . Glaucoma Mother   . Cancer Mother     breast, ovarian  . Heart disease Mother   . Gout Father   . Cancer Sister     ovarian, uterine cancer   Social History:  reports that she quit smoking about 38 years ago. Her smoking use included Cigarettes. She quit after 1 year of use. She has never used smokeless tobacco. She reports that she does not drink alcohol or use illicit drugs.  Allergies:  Allergies  Allergen Reactions  . Shellfish Allergy Anaphylaxis  . Effexor [Venlafaxine Hydrochloride] Other (See Comments)    HALLUCINATIONS  . Flexeril [Cyclobenzaprine Hcl] Other (See Comments)    HALLUCINATIONS AND "OUT OF CONTROL"  . Nubain [Nalbuphine Hcl] Other (See Comments)    HALLUCINATIONS  . Iodine   . Codeine Swelling and Rash  . Erythromycin Rash    No prescriptions prior to admission    No results found for this or any previous visit (from the past 48  hour(s)). No results found.  Review of Systems  All other systems reviewed and are negative.   There were no vitals taken for this visit. Physical Exam  Constitutional: She appears well-nourished.  HENT:  Head: Atraumatic.  Eyes: Conjunctivae are normal.  Neck: Normal range of motion.  Cardiovascular: Normal rate.   Respiratory: Effort normal.  GI: Soft.  Musculoskeletal:  Right wrist positive Tinel's for numbness tingling thumb and index finger.  Finkelstein's test positive at 10 seconds.  Neurological: She is alert.  Skin: Skin is warm.  Psychiatric: She has a normal mood and affect.     Assessment/Plan Right carpal tunnel syndrome by EMG nerve conduction study  Plan: I have discussed the surgical procedure which would be a release of the carpal tunnel right side.  The risk of anesthesia, infection and nerve injury associated with this type procedure.  Also the recovery time is been ongoing for a possible need for physical therapy.  Brandon Scarbrough R 08/26/2014, 12:10 PM

## 2014-08-31 ENCOUNTER — Encounter (HOSPITAL_BASED_OUTPATIENT_CLINIC_OR_DEPARTMENT_OTHER): Payer: Self-pay | Admitting: *Deleted

## 2014-08-31 NOTE — Progress Notes (Signed)
Patient is supposed to be on Atenolol and Lisinopril for HTN but has not taken in over 8 mos due to no money, however she has continued to take her Percocet, Skelaxin and Mobic. I asked her to come in for a BP check and an EKG.

## 2014-09-01 ENCOUNTER — Encounter (HOSPITAL_BASED_OUTPATIENT_CLINIC_OR_DEPARTMENT_OTHER)
Admission: RE | Admit: 2014-09-01 | Discharge: 2014-09-01 | Disposition: A | Discharge status: Home / Self Care | Source: Ambulatory Visit | Attending: Orthopaedic Surgery | Admitting: Orthopaedic Surgery

## 2014-09-01 DIAGNOSIS — Z91013 Allergy to seafood: Secondary | ICD-10-CM | POA: Diagnosis not present

## 2014-09-01 DIAGNOSIS — Z885 Allergy status to narcotic agent status: Secondary | ICD-10-CM | POA: Diagnosis not present

## 2014-09-01 DIAGNOSIS — Z883 Allergy status to other anti-infective agents status: Secondary | ICD-10-CM | POA: Diagnosis not present

## 2014-09-01 DIAGNOSIS — N644 Mastodynia: Secondary | ICD-10-CM | POA: Diagnosis not present

## 2014-09-01 DIAGNOSIS — F419 Anxiety disorder, unspecified: Secondary | ICD-10-CM | POA: Diagnosis not present

## 2014-09-01 DIAGNOSIS — Z881 Allergy status to other antibiotic agents status: Secondary | ICD-10-CM | POA: Diagnosis not present

## 2014-09-01 DIAGNOSIS — G43909 Migraine, unspecified, not intractable, without status migrainosus: Secondary | ICD-10-CM | POA: Diagnosis not present

## 2014-09-01 DIAGNOSIS — I10 Essential (primary) hypertension: Secondary | ICD-10-CM | POA: Diagnosis not present

## 2014-09-01 DIAGNOSIS — Z87891 Personal history of nicotine dependence: Secondary | ICD-10-CM | POA: Diagnosis not present

## 2014-09-01 DIAGNOSIS — G5601 Carpal tunnel syndrome, right upper limb: Secondary | ICD-10-CM | POA: Diagnosis present

## 2014-09-01 DIAGNOSIS — Z888 Allergy status to other drugs, medicaments and biological substances status: Secondary | ICD-10-CM | POA: Diagnosis not present

## 2014-09-02 ENCOUNTER — Ambulatory Visit (HOSPITAL_BASED_OUTPATIENT_CLINIC_OR_DEPARTMENT_OTHER): Admitting: Anesthesiology

## 2014-09-02 ENCOUNTER — Encounter (HOSPITAL_BASED_OUTPATIENT_CLINIC_OR_DEPARTMENT_OTHER)
Admission: RE | Disposition: A | Discharge status: Home / Self Care | Payer: Self-pay | Source: Ambulatory Visit | Attending: Orthopaedic Surgery

## 2014-09-02 ENCOUNTER — Encounter (HOSPITAL_BASED_OUTPATIENT_CLINIC_OR_DEPARTMENT_OTHER): Payer: Self-pay | Admitting: Anesthesiology

## 2014-09-02 ENCOUNTER — Ambulatory Visit (HOSPITAL_BASED_OUTPATIENT_CLINIC_OR_DEPARTMENT_OTHER)
Admission: RE | Admit: 2014-09-02 | Discharge: 2014-09-02 | Disposition: A | Discharge status: Home / Self Care | Source: Ambulatory Visit | Attending: Orthopaedic Surgery | Admitting: Orthopaedic Surgery

## 2014-09-02 DIAGNOSIS — Z91013 Allergy to seafood: Secondary | ICD-10-CM | POA: Insufficient documentation

## 2014-09-02 DIAGNOSIS — Z883 Allergy status to other anti-infective agents status: Secondary | ICD-10-CM | POA: Insufficient documentation

## 2014-09-02 DIAGNOSIS — F419 Anxiety disorder, unspecified: Secondary | ICD-10-CM | POA: Insufficient documentation

## 2014-09-02 DIAGNOSIS — G43909 Migraine, unspecified, not intractable, without status migrainosus: Secondary | ICD-10-CM | POA: Insufficient documentation

## 2014-09-02 DIAGNOSIS — Z881 Allergy status to other antibiotic agents status: Secondary | ICD-10-CM | POA: Insufficient documentation

## 2014-09-02 DIAGNOSIS — Z87891 Personal history of nicotine dependence: Secondary | ICD-10-CM | POA: Insufficient documentation

## 2014-09-02 DIAGNOSIS — G5601 Carpal tunnel syndrome, right upper limb: Secondary | ICD-10-CM | POA: Diagnosis not present

## 2014-09-02 DIAGNOSIS — I10 Essential (primary) hypertension: Secondary | ICD-10-CM | POA: Insufficient documentation

## 2014-09-02 DIAGNOSIS — N644 Mastodynia: Secondary | ICD-10-CM | POA: Insufficient documentation

## 2014-09-02 DIAGNOSIS — Z885 Allergy status to narcotic agent status: Secondary | ICD-10-CM | POA: Insufficient documentation

## 2014-09-02 DIAGNOSIS — Z888 Allergy status to other drugs, medicaments and biological substances status: Secondary | ICD-10-CM | POA: Insufficient documentation

## 2014-09-02 HISTORY — PX: CARPAL TUNNEL RELEASE: SHX101

## 2014-09-02 HISTORY — DX: Major depressive disorder, single episode, unspecified: F32.9

## 2014-09-02 HISTORY — DX: Depression, unspecified: F32.A

## 2014-09-02 LAB — POCT HEMOGLOBIN-HEMACUE: Hemoglobin: 12.9 g/dL (ref 12.0–15.0)

## 2014-09-02 SURGERY — CARPAL TUNNEL RELEASE
Anesthesia: General | Site: Wrist | Laterality: Right

## 2014-09-02 MED ORDER — LACTATED RINGERS IV SOLN
INTRAVENOUS | Status: DC
  Administered 2014-09-02: 12:00:00 via INTRAVENOUS

## 2014-09-02 MED ORDER — GLYCOPYRROLATE 0.2 MG/ML IJ SOLN: 0.2000 mg | Freq: Once | INTRAMUSCULAR | Status: DC | PRN

## 2014-09-02 MED ORDER — LIDOCAINE HCL (PF) 1 % IJ SOLN
INTRAMUSCULAR | Status: AC
  Filled 2014-09-02: qty 30

## 2014-09-02 MED ORDER — BUPIVACAINE HCL (PF) 0.25 % IJ SOLN
INTRAMUSCULAR | Status: AC
Start: 2014-09-02 — End: 2014-09-02
  Filled 2014-09-02: qty 30

## 2014-09-02 MED ORDER — PROPOFOL 10 MG/ML IV BOLUS
INTRAVENOUS | Status: DC | PRN
  Administered 2014-09-02: 250 mg via INTRAVENOUS

## 2014-09-02 MED ORDER — DEXAMETHASONE SODIUM PHOSPHATE 4 MG/ML IJ SOLN
INTRAMUSCULAR | Status: DC | PRN
Start: 2014-09-02 — End: 2014-09-02
  Administered 2014-09-02: 10 mg via INTRAVENOUS

## 2014-09-02 MED ORDER — BUPIVACAINE HCL (PF) 0.25 % IJ SOLN
INTRAMUSCULAR | Status: DC | PRN
  Administered 2014-09-02: 8 mL

## 2014-09-02 MED ORDER — CEFAZOLIN SODIUM-DEXTROSE 2-3 GM-% IV SOLR
2.0000 g | INTRAVENOUS | Status: AC
  Administered 2014-09-02: 2 g via INTRAVENOUS

## 2014-09-02 MED ORDER — HYDROMORPHONE HCL 1 MG/ML IJ SOLN
INTRAMUSCULAR | Status: AC
  Filled 2014-09-02: qty 1

## 2014-09-02 MED ORDER — ONDANSETRON HCL 4 MG/2ML IJ SOLN
INTRAMUSCULAR | Status: DC | PRN
  Administered 2014-09-02: 4 mg via INTRAVENOUS

## 2014-09-02 MED ORDER — CHLORHEXIDINE GLUCONATE 4 % EX LIQD: 60.0000 mL | Freq: Once | CUTANEOUS | Status: DC

## 2014-09-02 MED ORDER — KETOROLAC TROMETHAMINE 30 MG/ML IJ SOLN
INTRAMUSCULAR | Status: DC | PRN
  Administered 2014-09-02: 30 mg via INTRAVENOUS

## 2014-09-02 MED ORDER — SCOPOLAMINE 1 MG/3DAYS TD PT72
1.0000 | MEDICATED_PATCH | Freq: Once | TRANSDERMAL | Status: AC | PRN
  Administered 2014-09-02: 1 via TRANSDERMAL

## 2014-09-02 MED ORDER — OXYCODONE HCL 5 MG/5ML PO SOLN: 5.0000 mg | Freq: Once | ORAL | Status: DC | PRN

## 2014-09-02 MED ORDER — FENTANYL CITRATE (PF) 100 MCG/2ML IJ SOLN
INTRAMUSCULAR | Status: AC
  Filled 2014-09-02: qty 4

## 2014-09-02 MED ORDER — SCOPOLAMINE 1 MG/3DAYS TD PT72
MEDICATED_PATCH | TRANSDERMAL | Status: AC
  Filled 2014-09-02: qty 1

## 2014-09-02 MED ORDER — MIDAZOLAM HCL 2 MG/2ML IJ SOLN
1.0000 mg | INTRAMUSCULAR | Status: DC | PRN
  Administered 2014-09-02: 2 mg via INTRAVENOUS

## 2014-09-02 MED ORDER — OXYCODONE-ACETAMINOPHEN 5-325 MG PO TABS: 1.0000 | ORAL_TABLET | Freq: Four times a day (QID) | ORAL | Status: DC | PRN

## 2014-09-02 MED ORDER — MIDAZOLAM HCL 2 MG/2ML IJ SOLN
INTRAMUSCULAR | Status: AC
  Filled 2014-09-02: qty 2

## 2014-09-02 MED ORDER — HYDROMORPHONE HCL 1 MG/ML IJ SOLN
0.2500 mg | INTRAMUSCULAR | Status: DC | PRN
  Administered 2014-09-02 (×3): 0.5 mg via INTRAVENOUS

## 2014-09-02 MED ORDER — MEPERIDINE HCL 25 MG/ML IJ SOLN: 6.2500 mg | INTRAMUSCULAR | Status: DC | PRN

## 2014-09-02 MED ORDER — OXYCODONE HCL 5 MG PO TABS: 5.0000 mg | ORAL_TABLET | Freq: Once | ORAL | Status: DC | PRN

## 2014-09-02 MED ORDER — FENTANYL CITRATE (PF) 100 MCG/2ML IJ SOLN
50.0000 ug | INTRAMUSCULAR | Status: DC | PRN
Start: 2014-09-02 — End: 2014-09-02
  Administered 2014-09-02: 25 ug via INTRAVENOUS
  Administered 2014-09-02: 50 ug via INTRAVENOUS

## 2014-09-02 MED ORDER — CEFAZOLIN SODIUM-DEXTROSE 2-3 GM-% IV SOLR
INTRAVENOUS | Status: AC
  Filled 2014-09-02: qty 50

## 2014-09-02 MED ORDER — LIDOCAINE HCL (CARDIAC) 20 MG/ML IV SOLN
INTRAVENOUS | Status: DC | PRN
  Administered 2014-09-02: 50 mg via INTRAVENOUS

## 2014-09-02 SURGICAL SUPPLY — 53 items
BANDAGE ELASTIC 4 VELCRO ST LF (GAUZE/BANDAGES/DRESSINGS) ×3 IMPLANT
BLADE MINI RND TIP GREEN BEAV (BLADE) ×3 IMPLANT
BLADE SURG 15 STRL LF DISP TIS (BLADE) ×1 IMPLANT
BLADE SURG 15 STRL SS (BLADE) ×2
BNDG ESMARK 4X9 LF (GAUZE/BANDAGES/DRESSINGS) ×3 IMPLANT
BNDG GAUZE ELAST 4 BULKY (GAUZE/BANDAGES/DRESSINGS) ×3 IMPLANT
BNDG PLASTER X FAST 3X3 WHT LF (CAST SUPPLIES) ×30 IMPLANT
CHLORAPREP W/TINT 26ML (MISCELLANEOUS) ×3 IMPLANT
COVER BACK TABLE 60X90IN (DRAPES) ×3 IMPLANT
COVER MAYO STAND STRL (DRAPES) ×3 IMPLANT
CUFF TOURNIQUET SINGLE 18IN (TOURNIQUET CUFF) ×3 IMPLANT
CUFF TOURNIQUET SINGLE 24IN (TOURNIQUET CUFF) IMPLANT
DRAPE EXTREMITY T 121X128X90 (DRAPE) ×3 IMPLANT
DRAPE SURG 17X23 STRL (DRAPES) ×3 IMPLANT
DRSG EMULSION OIL 3X3 NADH (GAUZE/BANDAGES/DRESSINGS) ×3 IMPLANT
DRSG PAD ABDOMINAL 8X10 ST (GAUZE/BANDAGES/DRESSINGS) IMPLANT
DURAPREP 26ML APPLICATOR (WOUND CARE) ×3 IMPLANT
ELECT NEEDLE TIP 2.8 STRL (NEEDLE) IMPLANT
ELECT REM PT RETURN 9FT ADLT (ELECTROSURGICAL) ×3
ELECTRODE REM PT RTRN 9FT ADLT (ELECTROSURGICAL) ×1 IMPLANT
GAUZE SPONGE 4X4 12PLY STRL (GAUZE/BANDAGES/DRESSINGS) ×3 IMPLANT
GLOVE BIO SURGEON STRL SZ 6.5 (GLOVE) ×2 IMPLANT
GLOVE BIO SURGEON STRL SZ8 (GLOVE) ×6 IMPLANT
GLOVE BIO SURGEONS STRL SZ 6.5 (GLOVE) ×1
GLOVE BIOGEL PI IND STRL 7.0 (GLOVE) ×1 IMPLANT
GLOVE BIOGEL PI IND STRL 8 (GLOVE) ×2 IMPLANT
GLOVE BIOGEL PI INDICATOR 7.0 (GLOVE) ×2
GLOVE BIOGEL PI INDICATOR 8 (GLOVE) ×4
GLOVE ECLIPSE 6.5 STRL STRAW (GLOVE) ×3 IMPLANT
GOWN STRL REUS W/ TWL LRG LVL3 (GOWN DISPOSABLE) ×1 IMPLANT
GOWN STRL REUS W/ TWL XL LVL3 (GOWN DISPOSABLE) ×1 IMPLANT
GOWN STRL REUS W/TWL LRG LVL3 (GOWN DISPOSABLE) ×2
GOWN STRL REUS W/TWL XL LVL3 (GOWN DISPOSABLE) ×2
LOOP VESSEL MAXI BLUE (MISCELLANEOUS) IMPLANT
NEEDLE HYPO 25X1 1.5 SAFETY (NEEDLE) ×3 IMPLANT
NS IRRIG 1000ML POUR BTL (IV SOLUTION) ×3 IMPLANT
PACK BASIN DAY SURGERY FS (CUSTOM PROCEDURE TRAY) ×3 IMPLANT
PAD CAST 4YDX4 CTTN HI CHSV (CAST SUPPLIES) ×1 IMPLANT
PADDING CAST ABS 4INX4YD NS (CAST SUPPLIES)
PADDING CAST ABS COTTON 4X4 ST (CAST SUPPLIES) IMPLANT
PADDING CAST COTTON 4X4 STRL (CAST SUPPLIES) ×2
PENCIL BUTTON HOLSTER BLD 10FT (ELECTRODE) IMPLANT
SPLINT PLASTER CAST XFAST 3X15 (CAST SUPPLIES) ×10 IMPLANT
SPLINT PLASTER XTRA FASTSET 3X (CAST SUPPLIES) ×20
STOCKINETTE 4X48 STRL (DRAPES) ×3 IMPLANT
SUT ETHILON 4 0 PS 2 18 (SUTURE) ×3 IMPLANT
SUT VIC AB 4-0 P-3 18XBRD (SUTURE) IMPLANT
SUT VIC AB 4-0 P3 18 (SUTURE)
SYR BULB 3OZ (MISCELLANEOUS) IMPLANT
SYR CONTROL 10ML LL (SYRINGE) ×3 IMPLANT
TOWEL OR 17X24 6PK STRL BLUE (TOWEL DISPOSABLE) ×3 IMPLANT
TOWEL OR NON WOVEN STRL DISP B (DISPOSABLE) ×3 IMPLANT
UNDERPAD 30X30 (UNDERPADS AND DIAPERS) ×3 IMPLANT

## 2014-09-02 NOTE — Transfer of Care (Signed)
Immediate Anesthesia Transfer of Care Note  Patient: Sherri [name]  Procedure(s) Performed: Procedure(s): RIGHT CARPAL TUNNEL RELEASE (Right)  Patient Location: PACU  Anesthesia Type:General  Level of Consciousness: awake and sedated  Airway & Oxygen Therapy: Patient Spontanous Breathing and Patient connected to face mask oxygen  Post-op Assessment: Report given to RN and Post -op Vital signs reviewed and stable  Post vital signs: Reviewed and stable  Last Vitals:  Filed Vitals:   09/02/14 1151  BP: 182/89  Pulse: 85  Temp: 37.2 C  Resp: 18    Complications: No apparent anesthesia complications

## 2014-09-02 NOTE — Anesthesia Postprocedure Evaluation (Signed)
  Anesthesia Post-op Note  Patient: Sherri [name]  Procedure(s) Performed: Procedure(s): RIGHT CARPAL TUNNEL RELEASE (Right)  Patient Location: PACU  Anesthesia Type: General   Level of Consciousness: awake, alert  and oriented  Airway and Oxygen Therapy: Patient Spontanous Breathing  Post-op Pain: mild  Post-op Assessment: Post-op Vital signs reviewed  Post-op Vital Signs: Reviewed  Last Vitals:  Filed Vitals:   09/02/14 1400  BP: 130/59  Pulse: 77  Temp:   Resp: 14    Complications: No apparent anesthesia complications

## 2014-09-02 NOTE — Op Note (Signed)
PRE-OP DIAGNOSIS:  right carpal tunnel syndrome POST-OP DIAGNOSIS:  same PROCEDURE:  right carpal tunnel release ANESTHESIA:  general ATTENDING SURGEON:  Melrose Nakayama MD ASSISTANT:  Loni Dolly PA  INDICATION FOR PROCEDURE:  Sherri [name] is a 59 y.o. female with a long history of hand pain and numbness.  The patient has failed conservative measures of treatment and persists with symptoms which are very limiting.  The patient is offered carpal tunnel release in hopes of improving their situation by relieving pressure on the medial nerve.  Informed operative consent was obtained after discussion of possible complications including reaction to anesthesia, infection, and neurovascular injury.  SUMMARY OF FINDINGS AND PROCEDURE:  Under to above anesthetic a carpal tunnel release was performed.  The patient had a moderately thickened transverse carpal tunnel ligament which was applying compression to the underlying medial nerve.  This was release under direct visualization.  The skin was closed primarily followed by placement of a sterile dressing and splint with the wrist in slight extension.  DESCRIPTION OF PROCEDURE:  Sherri [name] was taken to an operative suite where the above anesthetic was applied.  The patient was then prepped and draped in normal sterile fashion.  An appropriate time out was performed.  After the administration of kefzol pre-operative antibiotic and application and inflation of a tourniquet a small incision was made on the palmar aspect of the palm.  This did not cross the wrist flexion crease and was ulnar to the thenar flexion crease.  Dissection was carried down through palmar fascia to expose the transverse carpal ligament which was released under direct visualization.  The dissection was taken distally towards the transverse arch of vessels and distally through the distal fascia of the forearm.  The wound was irrigated and the skin was closed with nylon.  The tourniquet  was deflated and skin edges became pink immediately and pulses were intact.  Adaptic was applied along with a sterile dressing and a plaster splint with the wrist in slight extension.  Estimated blood loss and intraoperative fluids can be obtained from anesthesia records as can tourniquet time.  DISPOSITION:  Sherri [name] was taken to recovery room in stable condition.  Plans were to go home same day and I will contact the patient by phone tonight.  The patient will follow-up in about a week in the office.  Azavier Creson G 09/02/2014, 1:15 PM

## 2014-09-02 NOTE — Interval H&P Note (Signed)
OK for surgery PD 

## 2014-09-02 NOTE — Anesthesia Preprocedure Evaluation (Signed)
Anesthesia Evaluation  Patient identified by MRN, date of birth, ID band Patient awake    Reviewed: Allergy & Precautions, NPO status , Patient's Chart, lab work & pertinent test results  Airway Mallampati: I  TM Distance: >3 FB Neck ROM: Full    Dental  (+) Teeth Intact, Dental Advisory Given   Pulmonary former smoker,  breath sounds clear to auscultation        Cardiovascular hypertension, Pt. on medications Rhythm:Regular Rate:Normal     Neuro/Psych    GI/Hepatic   Endo/Other    Renal/GU      Musculoskeletal   Abdominal   Peds  Hematology   Anesthesia Other Findings Cannot afford HTN meds  Reproductive/Obstetrics                             Anesthesia Physical Anesthesia Plan  ASA: II  Anesthesia Plan: General   Post-op Pain Management:    Induction: Intravenous  Airway Management Planned: LMA  Additional Equipment:   Intra-op Plan:   Post-operative Plan: Extubation in OR  Informed Consent: I have reviewed the patients History and Physical, chart, labs and discussed the procedure including the risks, benefits and alternatives for the proposed anesthesia with the patient or authorized representative who has indicated his/her understanding and acceptance.   Dental advisory given  Plan Discussed with: CRNA, Anesthesiologist and Surgeon  Anesthesia Plan Comments:         Anesthesia Quick Evaluation

## 2014-09-02 NOTE — Discharge Instructions (Signed)
Call your surgeon if you experience:   1.  Fever over 101.0. 2.  Inability to urinate. 3.  Nausea and/or vomiting. 4.  Extreme swelling or bruising at the surgical site. 5.  Continued bleeding from the incision. 6.  Increased pain, redness or drainage from the incision. 7.  Problems related to your pain medication. 8. Any change in color, movement and/or sensation 9. Any problems and/or concerns         HAND SURGERY    HOME CARE INSTRUCTIONS    The following instructions have been prepared to help you care for yourself upon your return home today.  Wound Care:  Keep your hand elevated above the level of your heart. Do not allow it to dangle by your side. Keep the dressing dry and do not remove it unless your doctor advises you to do so. He will usually change it at the time of you post-op visit. Moving your fingers is advised to stimulate circulation but will depend on the site of your surgery. Of course, if you have a splint applied your doctor will advise you about movement.  Activity:  Do not drive or operate machinery today. Rest today and then you may return to your normal activity and work as indicated by your physician.  Diet: Drink liquids today or eat a light diet. You may resume a regular diet tomorrow.  General expectations: Pain for two or three days. Fingers may become slightly swollen.   Unexpected Observations- Call your doctor if any of these occur: Severe pain not relieved by pain medication. Elevated temperature. Dressing soaked with blood. Inability to move fingers. White or bluish color to fingers.   Post Anesthesia Home Care Instructions  Activity: Get plenty of rest for the remainder of the day. A responsible adult should stay with you for 24 hours following the procedure.  For the next 24 hours, DO NOT: -Drive a car -Paediatric nurse -Drink alcoholic beverages -Take any medication unless instructed by your physician -Make any legal decisions  or sign important papers.  Meals: Start with liquid foods such as gelatin or soup. Progress to regular foods as tolerated. Avoid greasy, spicy, heavy foods. If nausea and/or vomiting occur, drink only clear liquids until the nausea and/or vomiting subsides. Call your physician if vomiting continues.  Special Instructions/Symptoms: Your throat may feel dry or sore from the anesthesia or the breathing tube placed in your throat during surgery. If this causes discomfort, gargle with warm salt water. The discomfort should disappear within 24 hours.  If you had a scopolamine patch placed behind your ear for the management of post- operative nausea and/or vomiting:  1. The medication in the patch is effective for 72 hours, after which it should be removed.  Wrap patch in a tissue and discard in the trash. Wash hands thoroughly with soap and water. 2. You may remove the patch earlier than 72 hours if you experience unpleasant side effects which may include dry mouth, dizziness or visual disturbances. 3. Avoid touching the patch. Wash your hands with soap and water after contact with the patch.

## 2014-09-02 NOTE — Anesthesia Procedure Notes (Signed)
Procedure Name: LMA Insertion Performed by: Terrance Mass Pre-anesthesia Checklist: Patient identified, Timeout performed, Emergency Drugs available, Suction available and Patient being monitored Patient Re-evaluated:Patient Re-evaluated prior to inductionOxygen Delivery Method: Circle system utilized Preoxygenation: Pre-oxygenation with 100% oxygen Intubation Type: IV induction Ventilation: Mask ventilation without difficulty LMA: LMA inserted LMA Size: 4.0 Tube type: Oral Number of attempts: 1 Placement Confirmation: positive ETCO2 Tube secured with: Tape Dental Injury: Teeth and Oropharynx as per pre-operative assessment

## 2014-09-05 ENCOUNTER — Encounter (HOSPITAL_BASED_OUTPATIENT_CLINIC_OR_DEPARTMENT_OTHER): Payer: Self-pay | Admitting: Orthopaedic Surgery

## 2015-12-11 ENCOUNTER — Encounter (HOSPITAL_COMMUNITY): Payer: Self-pay | Admitting: *Deleted

## 2015-12-11 ENCOUNTER — Ambulatory Visit (INDEPENDENT_AMBULATORY_CARE_PROVIDER_SITE_OTHER): Admitting: Family Medicine

## 2015-12-11 ENCOUNTER — Ambulatory Visit (INDEPENDENT_AMBULATORY_CARE_PROVIDER_SITE_OTHER)

## 2015-12-11 ENCOUNTER — Emergency Department (HOSPITAL_COMMUNITY)
Admission: EM | Admit: 2015-12-11 | Discharge: 2015-12-11 | Disposition: A | Discharge status: Home / Self Care | Attending: Emergency Medicine | Admitting: Emergency Medicine

## 2015-12-11 VITALS — BP 176/110 | HR 105 | Temp 98.9°F | Resp 17 | Ht 67.0 in | Wt 236.0 lb

## 2015-12-11 DIAGNOSIS — R079 Chest pain, unspecified: Secondary | ICD-10-CM | POA: Diagnosis not present

## 2015-12-11 DIAGNOSIS — G43009 Migraine without aura, not intractable, without status migrainosus: Secondary | ICD-10-CM

## 2015-12-11 DIAGNOSIS — I10 Essential (primary) hypertension: Secondary | ICD-10-CM | POA: Insufficient documentation

## 2015-12-11 DIAGNOSIS — Z79899 Other long term (current) drug therapy: Secondary | ICD-10-CM | POA: Insufficient documentation

## 2015-12-11 DIAGNOSIS — R51 Headache: Secondary | ICD-10-CM | POA: Diagnosis present

## 2015-12-11 DIAGNOSIS — Z7982 Long term (current) use of aspirin: Secondary | ICD-10-CM | POA: Insufficient documentation

## 2015-12-11 DIAGNOSIS — Z87891 Personal history of nicotine dependence: Secondary | ICD-10-CM | POA: Insufficient documentation

## 2015-12-11 LAB — CBC
HCT: 43.8 % (ref 36.0–46.0)
Hemoglobin: 14.7 g/dL (ref 12.0–15.0)
MCH: 28.3 pg (ref 26.0–34.0)
MCHC: 33.6 g/dL (ref 30.0–36.0)
MCV: 84.4 fL (ref 78.0–100.0)
Platelets: 343 10*3/uL (ref 150–400)
RBC: 5.19 MIL/uL — ABNORMAL HIGH (ref 3.87–5.11)
RDW: 13.4 % (ref 11.5–15.5)
WBC: 6.3 10*3/uL (ref 4.0–10.5)

## 2015-12-11 LAB — BASIC METABOLIC PANEL
ANION GAP: 12 (ref 5–15)
BUN: 8 mg/dL (ref 6–20)
CALCIUM: 9.9 mg/dL (ref 8.9–10.3)
CO2: 22 mmol/L (ref 22–32)
Chloride: 104 mmol/L (ref 101–111)
Creatinine, Ser: 0.66 mg/dL (ref 0.44–1.00)
GLUCOSE: 114 mg/dL — AB (ref 65–99)
Potassium: 3.7 mmol/L (ref 3.5–5.1)
SODIUM: 138 mmol/L (ref 135–145)

## 2015-12-11 LAB — I-STAT TROPONIN, ED: TROPONIN I, POC: 0 ng/mL (ref 0.00–0.08)

## 2015-12-11 MED ORDER — METOCLOPRAMIDE HCL 5 MG/ML IJ SOLN
10.0000 mg | Freq: Once | INTRAMUSCULAR | Status: AC
  Administered 2015-12-11: 10 mg via INTRAVENOUS
  Filled 2015-12-11: qty 2

## 2015-12-11 MED ORDER — KETOROLAC TROMETHAMINE 30 MG/ML IJ SOLN
30.0000 mg | Freq: Once | INTRAMUSCULAR | Status: AC
Start: 2015-12-11 — End: 2015-12-11
  Administered 2015-12-11: 30 mg via INTRAVENOUS
  Filled 2015-12-11: qty 1

## 2015-12-11 MED ORDER — DEXAMETHASONE SODIUM PHOSPHATE 10 MG/ML IJ SOLN
10.0000 mg | Freq: Once | INTRAMUSCULAR | Status: AC
  Administered 2015-12-11: 10 mg via INTRAVENOUS
  Filled 2015-12-11: qty 1

## 2015-12-11 MED ORDER — ASPIRIN 81 MG PO CHEW: 81.0000 mg | CHEWABLE_TABLET | Freq: Every day | ORAL | 3 refills | Status: DC

## 2015-12-11 MED ORDER — DIPHENHYDRAMINE HCL 50 MG/ML IJ SOLN
25.0000 mg | Freq: Once | INTRAMUSCULAR | Status: AC
  Administered 2015-12-11: 25 mg via INTRAVENOUS
  Filled 2015-12-11: qty 1

## 2015-12-11 MED ORDER — ASPIRIN 81 MG PO CHEW
325.0000 mg | CHEWABLE_TABLET | Freq: Once | ORAL | Status: AC
  Administered 2015-12-11: 325 mg via ORAL

## 2015-12-11 MED ORDER — OXYCODONE-ACETAMINOPHEN 5-325 MG PO TABS
ORAL_TABLET | ORAL | Status: AC
  Filled 2015-12-11: qty 1

## 2015-12-11 MED ORDER — SODIUM CHLORIDE 0.9 % IV BOLUS (SEPSIS)
1000.0000 mL | Freq: Once | INTRAVENOUS | Status: AC
  Administered 2015-12-11: 1000 mL via INTRAVENOUS

## 2015-12-11 MED ORDER — METOCLOPRAMIDE HCL 10 MG PO TABS: 10.0000 mg | ORAL_TABLET | Freq: Four times a day (QID) | ORAL | 0 refills | Status: DC | PRN

## 2015-12-11 NOTE — Patient Instructions (Addendum)
     IF you received an x-ray today, you will receive an invoice from [facility]. Please contact [facility] at [phone] with questions or concerns regarding your invoice.   IF you received labwork today, you will receive an invoice from [facility]. Please contact [facility] at [phone] with questions or concerns regarding your invoice.   Our billing staff will not be able to assist you with questions regarding bills from these companies.  You will be contacted with the lab results as soon as they are available. The fastest way to get your results is to activate your My Chart account. Instructions are located on the last page of this paperwork. If you have not heard from us regarding the results in 2 weeks, please contact this office.      

## 2015-12-11 NOTE — ED Triage Notes (Signed)
Pt states headache and elevated blood pressure since Tues.  Was sent here from [facility] for htn and L sided chest pain that radiates to L armpit.

## 2015-12-11 NOTE — ED Provider Notes (Signed)
[facility] DEPT Provider Note   CSN: [id number] Arrival date & time: 12/11/15  1651     History   Chief Complaint Chief Complaint  Patient presents with  . Chest Pain  . Hypertension    HPI Sherri [name] is a 60 y.o. female.  She complains of elevated blood pressure and headache for the last 5 days, chest pain which started today. She has a history of migraine headaches. She started having a global headache 5 days ago. It is throbbing and pounding and she rates pain at 10/10. Is worse with exposure to light. She has had nausea but no vomiting. She denies fever or chills. She denies visual change. She has taken over-the-counter medication for her headache without relief. That day, she saw her PCP who noted blood pressure was elevated and started her on hydrochlorothiazide. She had been on blood pressure medication in the past of lisinopril and atenolol, but had been taken off of them when her blood pressure 1 was well controlled. She thinks she last checked her blood pressure about 2 weeks prior to this and it was normal. Today, she developed anterior chest pain with radiation to left shoulder. Pain is sharp and worse with deep breath. She denies dyspnea or diaphoresis. She went to urgent care where she was noted to have poorly controlled blood pressure and chest pain which was reproducible as well as her ongoing headache and she was sent to the ED for further evaluation.   The history is provided by the patient.  Chest Pain    Her past medical history is significant for hypertension.  Hypertension  Associated symptoms include chest pain.    Past Medical History:  Diagnosis Date  . Anxiety   . Breast pain, right   . Depression   . Hypertension    not taken atenolol or lisinopril in >41mos, no money  . Migraines   . Ruptured disk     Patient Active Problem List   Diagnosis Date Noted  . Intractable migraine without aura and without status migrainosus 09/23/2013  .  Essential hypertension 09/23/2013  . Obesity, unspecified 09/23/2013  . Migraine 02/14/2011  . Obesity 02/14/2011  . 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 Blindness of right eye 02/14/2011  . Depression 02/14/2011    Past Surgical History:  Procedure Laterality Date  . BREAST SURGERY     lumpectomy in 20s for benign mass with implant placement (subglandular)  . CARPAL TUNNEL RELEASE     right wrist  . CARPAL TUNNEL RELEASE Right 09/02/2014   Procedure: RIGHT CARPAL TUNNEL RELEASE;  Surgeon: Melrose Nakayama, MD;  Location: [facility];  Service: Orthopedics;  Laterality: Right;  . CERVICAL CONE BIOPSY    . KNEE ARTHROSCOPY  07/1996  . SPINE SURGERY  72011   L2-L5 stabilization    OB History    No data available       Home Medications    Prior to Admission medications   Medication Sig Start Date End Date Taking? Authorizing Provider  aspirin (ASPIRIN CHILDRENS) 81 MG chewable tablet Chew 1 tablet (81 mg total) by mouth daily. 12/11/15   Sedalia Muta, FNP  atenolol (TENORMIN) 50 MG tablet take 1 tablet by mouth once daily Patient not taking: Reported on 12/11/2015 02/09/14   Harrison Mons, PA-C  DULoxetine (CYMBALTA) 30 MG capsule Take 90 mg by mouth daily.    Historical Provider, MD  hydrochlorothiazide (HYDRODIURIL) 12.5 MG tablet Take 12.5 mg by mouth  daily.    Historical Provider, MD  lisinopril (PRINIVIL,ZESTRIL) 10 MG tablet Take 1 tablet (10 mg total) by mouth daily. Patient not taking: Reported on 12/11/2015 02/09/14   Harrison Mons, PA-C  meloxicam (MOBIC) 7.5 MG tablet Take 7.5 mg by mouth daily. 01/20/14   Historical Provider, MD  metaxalone (SKELAXIN) 800 MG tablet Take 1 tablet (800 mg total) by mouth 3 (three) times daily as needed for pain. 02/27/12   Chelle Jeffery, PA-C  metoCLOPramide (REGLAN) 10 MG tablet Take 10 mg by mouth 4 (four) times daily.    Historical Provider, MD  mirtazapine (REMERON) 30 MG tablet Take 30 mg by  mouth at bedtime.    Historical Provider, MD  oxyCODONE-acetaminophen (PERCOCET) 10-325 MG per tablet Take 1 tablet by mouth every 6 (six) hours as needed.    Historical Provider, MD  oxyCODONE-acetaminophen (ROXICET) 5-325 MG per tablet Take 1-2 tablets by mouth every 6 (six) hours as needed for severe pain. 09/02/14   Melrose Nakayama, MD  topiramate (TOPAMAX) 25 MG tablet Start with 25 mg at bedtime.  May increase to 50 mg twice a day Patient not taking: Reported on 12/11/2015 09/23/13   Darreld Mclean, MD    Family History Family History  Problem Relation Age of Onset  . Diabetes Mother   . Glaucoma Mother   . Cancer Mother     breast, ovarian  . Heart disease Mother   . Gout Father   . Cancer Sister     ovarian, uterine cancer    Social History Social History  Substance Use Topics  . Smoking status: Former Smoker    Years: 1.00    Types: Cigarettes    Quit date: 01/08/1976  . Smokeless tobacco: Never Used  . Alcohol use No     Allergies   Shellfish allergy; Effexor [venlafaxine hydrochloride]; Flexeril [cyclobenzaprine hcl]; Nubain [nalbuphine hcl]; Iodine; Codeine; and Erythromycin   Review of Systems Review of Systems  Cardiovascular: Positive for chest pain.  All other systems reviewed and are negative.    Physical Exam Updated Vital Signs BP 178/98 (BP Location: Left Arm)   Pulse 91   Temp 98.5 F (36.9 C) (Oral)   Resp 16   Ht 5\' 8"  (1.727 m)   Wt 236 lb (107 kg)   SpO2 99%   BMI 35.88 kg/m   Physical Exam  Nursing note and vitals reviewed.  60 year old female, resting comfortably and in no acute distress. Vital signs are Significant for hypertension. Oxygen saturation is 99%, which is normal. Head is normocephalic and atraumatic. PERRLA, EOMI. Oropharynx is clear. Neck is nontender and supple without adenopathy or JVD. Back is nontender and there is no CVA tenderness. Lungs are clear without rales, wheezes, or rhonchi. Chest has moderate  tenderness in the costochondral region bilaterally. Heart has regular rate and rhythm without murmur. Abdomen is soft, flat, nontender without masses or hepatosplenomegaly and peristalsis is normoactive. Extremities have no cyanosis or edema, full range of motion is present. Skin is warm and dry without rash. Neurologic: Mental status is normal, cranial nerves are intact, there are no motor or sensory deficits.  ED Treatments / Results  Labs (all labs ordered are listed, but only abnormal results are displayed) Labs Reviewed  BASIC METABOLIC PANEL - Abnormal; Notable for the following:       Result Value   Glucose, Bld 114 (*)    All other components within normal limits  CBC - Abnormal; Notable for the following:  RBC 5.19 (*)    All other components within normal limits  I-STAT TROPOININ, ED    EKG  EKG Interpretation  Date/Time:  Monday December 11 2015 17:52:45 EST Ventricular Rate:  95 PR Interval:  160 QRS Duration: 82 QT Interval:  360 QTC Calculation: 452 R Axis:   17 Text Interpretation:  Normal sinus rhythm Possible Left atrial enlargement Cannot rule out Anterior infarct , age undetermined Abnormal ECG When compared with ECG of 09/01/2014, No significant change was found Confirmed by [facility]  MD, Mardy Hoppe ([id number]) on 12/11/2015 9:05:34 PM       Radiology Dg Chest 2 View  Result Date: 12/11/2015 CLINICAL DATA:  Chest pain. EXAM: CHEST  2 VIEW COMPARISON:  None. FINDINGS: The cardiomediastinal silhouette is unremarkable. There is no evidence of focal airspace disease, pulmonary edema, suspicious pulmonary nodule/mass, pleural effusion, or pneumothorax. No acute bony abnormalities are identified. IMPRESSION: No active cardiopulmonary disease. Electronically Signed   By: Margarette Canada M.D.   On: 12/11/2015 16:00    Procedures Procedures (including critical care time)  Medications Ordered in ED Medications  sodium chloride 0.9 % bolus 1,000 mL (0 mLs Intravenous Stopped  12/11/15 2246)  metoCLOPramide (REGLAN) injection 10 mg (10 mg Intravenous Given 12/11/15 2158)  diphenhydrAMINE (BENADRYL) injection 25 mg (25 mg Intravenous Given 12/11/15 2157)  ketorolac (TORADOL) 30 MG/ML injection 30 mg (30 mg Intravenous Given 12/11/15 2158)  dexamethasone (DECADRON) injection 10 mg (10 mg Intravenous Given 12/11/15 2158)     Initial Impression / Assessment and Plan / ED Course  I have reviewed the triage vital signs and the nursing notes.  Pertinent labs & imaging results that were available during my care of the patient were reviewed by me and considered in my medical decision making (see chart for details).  Clinical Course    Poorly controlled hypertension. At this point, it is not clear if this is actually a loss of control of her blood pressure or whether her blood pressure is elevated related to her pain. Headache which does appear to be a migraine variant. Chest pain which is reproducible and is strongly suggestive of costochondritis. Review of old records shows blood pressure has generally been poorly controlled had ED visits or office visits. She will be given a headache cocktail of normal saline, metoclopramide, diphenhydramine, dexamethasone, and ketorolac and reassessed. Blood pressure may also need to be reassessed at that point.  She had excellent relief of her headache with above noted treatment. Blood pressure aimed down to 160/80. She is discharged with prescription for metoclopramide and is referred back to PCP for management of blood pressure.  Final Clinical Impressions(s) / ED Diagnoses   Final diagnoses:  Migraine without aura and without status migrainosus, not intractable  Essential hypertension    New Prescriptions New Prescriptions   No medications on file     Delora Fuel, MD [id number]

## 2015-12-11 NOTE — ED Notes (Signed)
Pt reports history of migraines since she was a child.  Headache said to be her normal migraine.  States this all started one week ago "when I started on htn medication."

## 2015-12-11 NOTE — Discharge Instructions (Signed)
Check your blood pressure every day and keep a record of it. Take that record with you when you see your primary care provider.

## 2015-12-11 NOTE — ED Notes (Signed)
Pt.s family member came up to [facility] desk and reported the pt.s Chest pain is getting worse.  This RN assesed pt. And she reported that the pain is not worse, but she has a headache and is fanning herself.  Took her back to triage to redo her ECG and recheck her vitals.

## 2015-12-11 NOTE — ED Notes (Signed)
Up to rr to void without difficulty

## 2015-12-11 NOTE — Progress Notes (Signed)
Patient ID: Sherri [name], female    DOB: 15-Oct-1955, 60 y.o.   MRN: [id number]  PCP: Harrison Mons, PA-C  Chief Complaint  Patient presents with  . Medication Problem    Pt put on BP medication, since then c/o chest tightness, headache, dizziness.     Subjective:   HPI 60 year old female presents for evaluation of chest tightness and blood pressure re-evaluation.  Went to [facility] Wednesday night and blood pressure was more elevated than today and was placed on HCTZ. A provider is now. Reports was previously on Lisinopril and Atenolol. Blood pressure improved and the medication was  eim Current headache which has been intermittent   Took medication at 0630 this morning and pressure did not improve. Occasional checks blood pressure but did not check it today.Urinating regularly and more frequently while taking HCTZ   Review of Systems HPI Patient Active Problem List   Diagnosis Date Noted  . Intractable migraine without aura and without status migrainosus 09/23/2013  . Essential hypertension 09/23/2013  . Obesity, unspecified 09/23/2013  . Migraine 02/14/2011  . Obesity 02/14/2011  . 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 Blindness of right eye 02/14/2011  . Depression 02/14/2011     Prior to Admission medications   Medication Sig Start Date End Date Taking? Authorizing Provider  hydrochlorothiazide (HYDRODIURIL) 12.5 MG tablet Take 12.5 mg by mouth daily.   Yes Historical Provider, MD  metoCLOPramide (REGLAN) 10 MG tablet Take 10 mg by mouth 4 (four) times daily.   Yes Historical Provider, MD  atenolol (TENORMIN) 50 MG tablet take 1 tablet by mouth once daily Patient not taking: Reported on 12/11/2015 02/09/14   Harrison Mons, PA-C  DULoxetine (CYMBALTA) 30 MG capsule Take 90 mg by mouth daily.    Historical Provider, MD  lisinopril (PRINIVIL,ZESTRIL) 10 MG tablet Take 1 tablet (10 mg total) by mouth daily. Patient not taking: Reported on 12/11/2015  02/09/14   Harrison Mons, PA-C  meloxicam (MOBIC) 7.5 MG tablet Take 7.5 mg by mouth daily. 01/20/14   Historical Provider, MD  metaxalone (SKELAXIN) 800 MG tablet Take 1 tablet (800 mg total) by mouth 3 (three) times daily as needed for pain. 02/27/12   Chelle Jeffery, PA-C  mirtazapine (REMERON) 30 MG tablet Take 30 mg by mouth at bedtime.    Historical Provider, MD  oxyCODONE-acetaminophen (PERCOCET) 10-325 MG per tablet Take 1 tablet by mouth every 6 (six) hours as needed.    Historical Provider, MD  oxyCODONE-acetaminophen (ROXICET) 5-325 MG per tablet Take 1-2 tablets by mouth every 6 (six) hours as needed for severe pain. 09/02/14   Melrose Nakayama, MD  topiramate (TOPAMAX) 25 MG tablet Start with 25 mg at bedtime.  May increase to 50 mg twice a day Patient not taking: Reported on 12/11/2015 09/23/13   Darreld Mclean, MD     Allergies  Allergen Reactions  . Shellfish Allergy Anaphylaxis  . Effexor [Venlafaxine Hydrochloride] Other (See Comments)    HALLUCINATIONS  . Flexeril [Cyclobenzaprine Hcl] Other (See Comments)    HALLUCINATIONS AND "OUT OF CONTROL"  . Nubain [Nalbuphine Hcl] Other (See Comments)    HALLUCINATIONS  . Iodine   . Codeine Swelling and Rash  . Erythromycin Rash       Objective:  Physical Exam  Constitutional: She is oriented to person, place, and time. She appears well-developed and well-nourished.  HENT:  Head: Normocephalic and atraumatic.  Right Ear: External ear normal.  Left Ear: External ear  normal.  Cardiovascular: Normal rate, regular rhythm, normal heart sounds and intact distal pulses.   Chest pain-un reproducible with deep breathing or chest palpation  Pulmonary/Chest: Effort normal and breath sounds normal.  Abdominal: Soft. Bowel sounds are normal.  Musculoskeletal: Normal range of motion.  Neurological: She is alert and oriented to person, place, and time. She has normal reflexes.  Skin: Skin is warm and dry.  Psychiatric: She has a normal  mood and affect. Her behavior is normal. Judgment and thought content normal.    Today's Vitals   12/11/15 1519 12/11/15 1552  BP: (!) 180/110 (!) 176/110  Pulse: (!) 105   Resp: 17   Temp: 98.9 F (37.2 C)   TempSrc: Oral   SpO2: 97%   Weight: 236 lb (107 kg)   Height: 5\' 7"  (1.702 m)      Assessment & Plan:  1. Chest pain, unspecified type - EKG 12-Lead - DG Chest 2 View Plan: -Patient is willing to go to the Emergency Department, although refuses to go via EMS. Family member will be transporting patient to [facility] for further diagnostic testing. -Administered 325 mg of Aspirin.  Carroll Sage. Kenton Kingfisher, MSN, FNP-C [facility]

## 2015-12-11 NOTE — ED Notes (Signed)
Pt. Stated, My headache pain is worse but my chest pain is not as bad.  The chest is about a 4/10, headache he is about a 9/10

## 2015-12-11 NOTE — ED Notes (Addendum)
Spoke with MD at Urgent Care, sending pt. To [place] for further evaluation of reproducible chest pain for 4 days.  Pt. s BP 176/110 .

## 2015-12-16 ENCOUNTER — Emergency Department (HOSPITAL_COMMUNITY)

## 2015-12-16 ENCOUNTER — Encounter (HOSPITAL_COMMUNITY): Payer: Self-pay | Admitting: Nurse Practitioner

## 2015-12-16 ENCOUNTER — Inpatient Hospital Stay (HOSPITAL_COMMUNITY)
Admission: EM | Admit: 2015-12-16 | Discharge: 2015-12-20 | DRG: 494 | Disposition: A | Discharge status: Home / Self Care | Attending: Internal Medicine | Admitting: Internal Medicine

## 2015-12-16 DIAGNOSIS — I10 Essential (primary) hypertension: Secondary | ICD-10-CM | POA: Diagnosis present

## 2015-12-16 DIAGNOSIS — H5440 Blindness, one eye, unspecified eye: Secondary | ICD-10-CM | POA: Diagnosis not present

## 2015-12-16 DIAGNOSIS — Z79899 Other long term (current) drug therapy: Secondary | ICD-10-CM | POA: Diagnosis not present

## 2015-12-16 DIAGNOSIS — S82842A Displaced bimalleolar fracture of left lower leg, initial encounter for closed fracture: Secondary | ICD-10-CM | POA: Diagnosis not present

## 2015-12-16 DIAGNOSIS — Z791 Long term (current) use of non-steroidal anti-inflammatories (NSAID): Secondary | ICD-10-CM

## 2015-12-16 DIAGNOSIS — R079 Chest pain, unspecified: Secondary | ICD-10-CM | POA: Diagnosis present

## 2015-12-16 DIAGNOSIS — Z91013 Allergy to seafood: Secondary | ICD-10-CM

## 2015-12-16 DIAGNOSIS — Z7982 Long term (current) use of aspirin: Secondary | ICD-10-CM

## 2015-12-16 DIAGNOSIS — F418 Other specified anxiety disorders: Secondary | ICD-10-CM | POA: Diagnosis present

## 2015-12-16 DIAGNOSIS — R0789 Other chest pain: Secondary | ICD-10-CM | POA: Diagnosis not present

## 2015-12-16 DIAGNOSIS — K5900 Constipation, unspecified: Secondary | ICD-10-CM | POA: Diagnosis not present

## 2015-12-16 DIAGNOSIS — Z9109 Other allergy status, other than to drugs and biological substances: Secondary | ICD-10-CM | POA: Diagnosis not present

## 2015-12-16 DIAGNOSIS — E785 Hyperlipidemia, unspecified: Secondary | ICD-10-CM | POA: Diagnosis present

## 2015-12-16 DIAGNOSIS — T464X6A Underdosing of angiotensin-converting-enzyme inhibitors, initial encounter: Secondary | ICD-10-CM | POA: Diagnosis present

## 2015-12-16 DIAGNOSIS — W000XXA Fall on same level due to ice and snow, initial encounter: Secondary | ICD-10-CM | POA: Diagnosis not present

## 2015-12-16 DIAGNOSIS — Z87891 Personal history of nicotine dependence: Secondary | ICD-10-CM | POA: Diagnosis not present

## 2015-12-16 DIAGNOSIS — B009 Herpesviral infection, unspecified: Secondary | ICD-10-CM | POA: Diagnosis present

## 2015-12-16 DIAGNOSIS — Z560 Unemployment, unspecified: Secondary | ICD-10-CM | POA: Diagnosis not present

## 2015-12-16 DIAGNOSIS — Z881 Allergy status to other antibiotic agents status: Secondary | ICD-10-CM

## 2015-12-16 DIAGNOSIS — Z09 Encounter for follow-up examination after completed treatment for conditions other than malignant neoplasm: Secondary | ICD-10-CM

## 2015-12-16 DIAGNOSIS — S82892A Other fracture of left lower leg, initial encounter for closed fracture: Secondary | ICD-10-CM | POA: Diagnosis present

## 2015-12-16 DIAGNOSIS — Z91120 Patient's intentional underdosing of medication regimen due to financial hardship: Secondary | ICD-10-CM | POA: Diagnosis not present

## 2015-12-16 DIAGNOSIS — Z419 Encounter for procedure for purposes other than remedying health state, unspecified: Secondary | ICD-10-CM

## 2015-12-16 DIAGNOSIS — Z885 Allergy status to narcotic agent status: Secondary | ICD-10-CM

## 2015-12-16 DIAGNOSIS — A6000 Herpesviral infection of urogenital system, unspecified: Secondary | ICD-10-CM | POA: Diagnosis present

## 2015-12-16 DIAGNOSIS — H5461 Unqualified visual loss, right eye, normal vision left eye: Secondary | ICD-10-CM | POA: Diagnosis not present

## 2015-12-16 LAB — CBC WITH DIFFERENTIAL/PLATELET
Basophils Absolute: 0 10*3/uL (ref 0.0–0.1)
Basophils Relative: 0 %
EOS ABS: 0.1 10*3/uL (ref 0.0–0.7)
Eosinophils Relative: 1 %
HEMATOCRIT: 36.9 % (ref 36.0–46.0)
HEMOGLOBIN: 12 g/dL (ref 12.0–15.0)
LYMPHS ABS: 1.9 10*3/uL (ref 0.7–4.0)
LYMPHS PCT: 18 %
MCH: 28.1 pg (ref 26.0–34.0)
MCHC: 32.5 g/dL (ref 30.0–36.0)
MCV: 86.4 fL (ref 78.0–100.0)
MONOS PCT: 5 %
Monocytes Absolute: 0.5 10*3/uL (ref 0.1–1.0)
NEUTROS ABS: 7.7 10*3/uL (ref 1.7–7.7)
NEUTROS PCT: 76 %
Platelets: 269 10*3/uL (ref 150–400)
RBC: 4.27 MIL/uL (ref 3.87–5.11)
RDW: 13.7 % (ref 11.5–15.5)
WBC: 10.2 10*3/uL (ref 4.0–10.5)

## 2015-12-16 LAB — BASIC METABOLIC PANEL
ANION GAP: 7 (ref 5–15)
BUN: 20 mg/dL (ref 6–20)
CHLORIDE: 106 mmol/L (ref 101–111)
CO2: 28 mmol/L (ref 22–32)
CREATININE: 0.7 mg/dL (ref 0.44–1.00)
Calcium: 9.1 mg/dL (ref 8.9–10.3)
GFR calc Af Amer: >60 mL/min (ref >60)
GFR calc non Af Amer: >60 mL/min (ref >60)
Glucose, Bld: 120 mg/dL — ABNORMAL HIGH (ref 65–99)
POTASSIUM: 3.4 mmol/L — AB (ref 3.5–5.1)
SODIUM: 141 mmol/L (ref 135–145)

## 2015-12-16 MED ORDER — HYDROMORPHONE HCL 1 MG/ML IJ SOLN
1.0000 mg | Freq: Once | INTRAMUSCULAR | Status: AC
  Administered 2015-12-16: 1 mg via INTRAVENOUS
  Filled 2015-12-16: qty 1

## 2015-12-16 MED ORDER — ONDANSETRON HCL 4 MG/2ML IJ SOLN
4.0000 mg | Freq: Once | INTRAMUSCULAR | Status: AC
  Administered 2015-12-16: 4 mg via INTRAVENOUS
  Filled 2015-12-16: qty 2

## 2015-12-16 MED ORDER — LIDOCAINE HCL (PF) 1 % IJ SOLN
30.0000 mL | Freq: Once | INTRAMUSCULAR | Status: AC
  Administered 2015-12-16: 10 mL
  Filled 2015-12-16: qty 30

## 2015-12-16 NOTE — ED Notes (Signed)
Patient transported to X-ray 

## 2015-12-16 NOTE — ED Provider Notes (Signed)
[facility] DEPT Provider Note   CSN: [id number] Arrival date & time: 12/16/15  2125     History   Chief Complaint Chief Complaint  Patient presents with  . Ankle Injury  . Fall    HPI GELSEY OVERMIER is a 60 y.o. female.  HPI Patient was out walking her dogs slip and fell about 2030. Heard a popping sound to left ankle, positive deformity. Positive pedal pulses present 190/80 and 160/88 HR 100 #20 L hand . Fentanyl totaling 179mcg (2) 30mcg doses. Takes Percocet for back pain and scheduled for upcoming back surgery. No changes in LOC. Denies neck, head, or back pain.  Past Medical History:  Diagnosis Date  . Anxiety   . Breast pain, right   . Depression   . Hypertension    not taken atenolol or lisinopril in >14mos, no money  . Migraines   . Ruptured disk     Patient Active Problem List   Diagnosis Date Noted  . Intractable migraine without aura and without status migrainosus 09/23/2013  . Essential hypertension 09/23/2013  . Obesity, unspecified 09/23/2013  . Migraine 02/14/2011  . Obesity 02/14/2011  . 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 Blindness of right eye 02/14/2011  . Depression 02/14/2011    Past Surgical History:  Procedure Laterality Date  . BREAST SURGERY     lumpectomy in 20s for benign mass with implant placement (subglandular)  . CARPAL TUNNEL RELEASE     right wrist  . CARPAL TUNNEL RELEASE Right 09/02/2014   Procedure: RIGHT CARPAL TUNNEL RELEASE;  Surgeon: Melrose Nakayama, MD;  Location: [facility];  Service: Orthopedics;  Laterality: Right;  . CERVICAL CONE BIOPSY    . KNEE ARTHROSCOPY  07/1996  . SPINE SURGERY  72011   L2-L5 stabilization    OB History    No data available       Home Medications    Prior to Admission medications   Medication Sig Start Date End Date Taking? Authorizing Provider  aspirin (ASPIRIN CHILDRENS) 81 MG chewable tablet Chew 1 tablet (81 mg total) by mouth daily. 12/11/15   Yes Sedalia Muta, FNP  Butalbital-Acetaminophen 50-300 MG TABS Take 1-2 tablets by mouth daily as needed for migraine.   Yes Historical Provider, MD  hydrochlorothiazide (HYDRODIURIL) 12.5 MG tablet Take 12.5 mg by mouth daily.   Yes Historical Provider, MD  meloxicam (MOBIC) 15 MG tablet Take 15 mg by mouth daily.   Yes Historical Provider, MD  metaxalone (SKELAXIN) 800 MG tablet Take 1 tablet (800 mg total) by mouth 3 (three) times daily as needed for pain. 02/27/12  Yes Chelle Jeffery, PA-C  metoCLOPramide (REGLAN) 10 MG tablet Take 1 tablet (10 mg total) by mouth every 6 (six) hours as needed for nausea (or headache). 0000000  Yes Delora Fuel, MD  Multiple Vitamin (MULTIVITAMIN WITH MINERALS) TABS tablet Take 1 tablet by mouth daily.   Yes Historical Provider, MD  oxyCODONE-acetaminophen (PERCOCET) 10-325 MG per tablet Take 1 tablet by mouth every 6 (six) hours as needed for pain.    Yes Historical Provider, MD  Phenyleph-Doxylamine-DM-APAP (ALKA-SELTZER PLS NIGHT CLD/FLU PO) Take 1 each by mouth 2 (two) times daily as needed (congestion).   Yes Historical Provider, MD  valACYclovir (VALTREX) 500 MG tablet Take 500 mg by mouth 2 (two) times daily.   Yes Historical Provider, MD    Family History Family History  Problem Relation Age of Onset  . Diabetes Mother   .  Glaucoma Mother   . Cancer Mother     breast, ovarian  . Heart disease Mother   . Gout Father   . Cancer Sister     ovarian, uterine cancer    Social History Social History  Substance Use Topics  . Smoking status: Former Smoker    Years: 1.00    Types: Cigarettes    Quit date: 01/08/1976  . Smokeless tobacco: Never Used  . Alcohol use No     Allergies   Effexor [venlafaxine hydrochloride]; Iodine; Shellfish allergy; Flexeril [cyclobenzaprine hcl]; Hydromorphone; Nubain [nalbuphine hcl]; Codeine; and Erythromycin   Review of Systems Review of Systems  All other systems reviewed and are  negative.    Physical Exam Updated Vital Signs BP 165/95 (BP Location: Left Arm)   Pulse 94   Temp 98.3 F (36.8 C) (Oral)   Resp 20   Ht 5\' 8"  (1.727 m)   Wt 236 lb (107 kg)   SpO2 100%   BMI 35.88 kg/m   Physical Exam  Constitutional: She is oriented to person, place, and time. She appears well-developed and well-nourished. No distress.  HENT:  Head: Normocephalic and atraumatic.  Eyes: Pupils are equal, round, and reactive to light.  Neck: Normal range of motion.  Cardiovascular: Normal rate and intact distal pulses.   Pulmonary/Chest: No respiratory distress.  Abdominal: Normal appearance. She exhibits no distension.  Musculoskeletal:       Left wrist: She exhibits no laceration.       Left ankle: She exhibits decreased range of motion, swelling and deformity. She exhibits no laceration and normal pulse. Tenderness. Lateral malleolus and medial malleolus tenderness found.  Neurological: She is alert and oriented to person, place, and time. No cranial nerve deficit.  Skin: Skin is warm and dry. No rash noted.  Psychiatric: She has a normal mood and affect. Her behavior is normal.  Nursing note and vitals reviewed.    ED Treatments / Results  Labs (all labs ordered are listed, but only abnormal results are displayed) Labs Reviewed  BASIC METABOLIC PANEL  CBC WITH DIFFERENTIAL/PLATELET    EKG  EKG Interpretation None       Radiology Dg Ankle Complete Left  Result Date: 12/16/2015 CLINICAL DATA:  Slip and fall on ice with left ankle pain, edema and deformity. EXAM: LEFT ANKLE COMPLETE - 3+ VIEW COMPARISON:  None. FINDINGS: Oblique mildly displaced distal fibular fracture just proximal to the ankle mortise. There is a mildly displaced posterior tibial tubercle fracture. Marked widening of the medial clear space consistent with ligamentous injury. Diffuse soft tissue edema. IMPRESSION: Trimalleolar injury with displaced fractures of the distal fibula and posterior  tibial tubercle. Widening of the medial clear space is consistent with ligamentous injury, medial malleolus fracture component is not seen. Electronically Signed   By: Jeb Levering M.D.   On: 12/16/2015 22:21    Procedures Procedures (including critical care time)  Medications Ordered in ED Medications  HYDROmorphone (DILAUDID) injection 1 mg (not administered)  HYDROmorphone (DILAUDID) injection 1 mg (1 mg Intravenous Given 12/16/15 2156)  ondansetron (ZOFRAN) injection 4 mg (4 mg Intravenous Given 12/16/15 2201)     Initial Impression / Assessment and Plan / ED Course  I have reviewed the triage vital signs and the nursing notes.  Pertinent labs & imaging results that were available during my care of the patient were reviewed by me and considered in my medical decision making (see chart for details).  Clinical Course   Discussed with  Dr. Grandville Silos from [facility] orthopedics.He is coming to the emergency room to evaluate the patient.    Final Clinical Impressions(s) / ED Diagnoses   Final diagnoses:  Closed fracture of left ankle, initial encounter    New Prescriptions New Prescriptions   No medications on file     Leonard Schwartz, MD 12/16/15 2254

## 2015-12-16 NOTE — ED Triage Notes (Addendum)
Patient brought to ER via EMS. Patient was out walking her dogs slip and fell about 2030. Heard a popping sound to left ankle, positive deformity. Positive pedal pulses present 190/80 and 160/88 HR 100 #20 L hand . Fentanyl totaling 138mcg (2) 38mcg doses. Takes Percocet for back pain and scheduled for upcoming back surgery. No changes in LOC. Denies neck, head, or back pain.

## 2015-12-16 NOTE — ED Notes (Signed)
Bed: [id number] Expected date:  Expected time:  Means of arrival:  Comments: 60 yo F/ankle deformity

## 2015-12-17 ENCOUNTER — Observation Stay (HOSPITAL_COMMUNITY)

## 2015-12-17 ENCOUNTER — Observation Stay (HOSPITAL_COMMUNITY): Admitting: Certified Registered"

## 2015-12-17 ENCOUNTER — Encounter (HOSPITAL_COMMUNITY): Payer: Self-pay | Admitting: Certified Registered"

## 2015-12-17 ENCOUNTER — Encounter (HOSPITAL_COMMUNITY)
Admission: EM | Disposition: A | Discharge status: Home / Self Care | Payer: Self-pay | Source: Home / Self Care | Attending: Orthopedic Surgery

## 2015-12-17 DIAGNOSIS — S82892A Other fracture of left lower leg, initial encounter for closed fracture: Secondary | ICD-10-CM | POA: Diagnosis present

## 2015-12-17 HISTORY — PX: ORIF ANKLE FRACTURE: SHX5408

## 2015-12-17 HISTORY — DX: Other fracture of left lower leg, initial encounter for closed fracture: S82.892A

## 2015-12-17 LAB — SURGICAL PCR SCREEN
MRSA, PCR: NEGATIVE
Staphylococcus aureus: NEGATIVE

## 2015-12-17 SURGERY — OPEN REDUCTION INTERNAL FIXATION (ORIF) ANKLE FRACTURE
Anesthesia: Spinal | Site: Ankle | Laterality: Left

## 2015-12-17 MED ORDER — OXYCODONE-ACETAMINOPHEN 5-325 MG PO TABS
1.0000 | ORAL_TABLET | Freq: Four times a day (QID) | ORAL | Status: DC | PRN
Start: 2015-12-17 — End: 2015-12-17
  Administered 2015-12-17: 03:00:00 1 via ORAL
  Filled 2015-12-17: qty 1

## 2015-12-17 MED ORDER — DOCUSATE SODIUM 100 MG PO CAPS
100.0000 mg | ORAL_CAPSULE | Freq: Two times a day (BID) | ORAL | Status: DC
  Administered 2015-12-17 – 2015-12-19 (×6): 100 mg via ORAL
  Filled 2015-12-17 (×6): qty 1

## 2015-12-17 MED ORDER — PHENYLEPHRINE 40 MCG/ML (10ML) SYRINGE FOR IV PUSH (FOR BLOOD PRESSURE SUPPORT)
PREFILLED_SYRINGE | INTRAVENOUS | Status: DC | PRN
  Administered 2015-12-17 (×5): 80 ug via INTRAVENOUS

## 2015-12-17 MED ORDER — MIDAZOLAM HCL 5 MG/5ML IJ SOLN
INTRAMUSCULAR | Status: DC | PRN
  Administered 2015-12-17: 1 mg via INTRAVENOUS
  Administered 2015-12-17: 2 mg via INTRAVENOUS
  Administered 2015-12-17: 1 mg via INTRAVENOUS

## 2015-12-17 MED ORDER — MIDAZOLAM HCL 2 MG/2ML IJ SOLN
INTRAMUSCULAR | Status: AC
  Filled 2015-12-17: qty 2

## 2015-12-17 MED ORDER — VALACYCLOVIR HCL 500 MG PO TABS
500.0000 mg | ORAL_TABLET | Freq: Two times a day (BID) | ORAL | Status: DC
  Administered 2015-12-17 – 2015-12-20 (×8): 500 mg via ORAL
  Filled 2015-12-17 (×10): qty 1

## 2015-12-17 MED ORDER — FENTANYL CITRATE (PF) 100 MCG/2ML IJ SOLN
INTRAMUSCULAR | Status: DC | PRN
  Administered 2015-12-17: 100 ug via INTRAVENOUS

## 2015-12-17 MED ORDER — ONDANSETRON HCL 4 MG/2ML IJ SOLN
INTRAMUSCULAR | Status: AC
  Filled 2015-12-17: qty 2

## 2015-12-17 MED ORDER — PROPOFOL 10 MG/ML IV BOLUS
INTRAVENOUS | Status: DC | PRN
  Administered 2015-12-17 (×2): 20 mg via INTRAVENOUS

## 2015-12-17 MED ORDER — METAXALONE 800 MG PO TABS
800.0000 mg | ORAL_TABLET | Freq: Three times a day (TID) | ORAL | Status: DC | PRN
  Administered 2015-12-17 – 2015-12-20 (×7): 800 mg via ORAL
  Filled 2015-12-17 (×10): qty 1

## 2015-12-17 MED ORDER — LIDOCAINE 2% (20 MG/ML) 5 ML SYRINGE
INTRAMUSCULAR | Status: DC | PRN
  Administered 2015-12-17: 100 mg via INTRAVENOUS

## 2015-12-17 MED ORDER — PROPOFOL 10 MG/ML IV BOLUS
INTRAVENOUS | Status: AC
  Filled 2015-12-17: qty 20

## 2015-12-17 MED ORDER — LACTATED RINGERS IV SOLN: INTRAVENOUS | Status: DC

## 2015-12-17 MED ORDER — HYDROCHLOROTHIAZIDE 12.5 MG PO CAPS
12.5000 mg | ORAL_CAPSULE | Freq: Every day | ORAL | Status: DC
Start: 2015-12-17 — End: 2015-12-20
  Administered 2015-12-17 – 2015-12-20 (×4): 12.5 mg via ORAL
  Filled 2015-12-17 (×4): qty 1

## 2015-12-17 MED ORDER — LACTATED RINGERS IV SOLN
INTRAVENOUS | Status: DC | PRN
  Administered 2015-12-17 (×2): via INTRAVENOUS

## 2015-12-17 MED ORDER — ASPIRIN 81 MG PO CHEW: 81.0000 mg | CHEWABLE_TABLET | Freq: Every day | ORAL | Status: DC

## 2015-12-17 MED ORDER — OXYCODONE HCL 10 MG PO TABS: 10.0000 mg | ORAL_TABLET | ORAL | 0 refills | Status: DC | PRN

## 2015-12-17 MED ORDER — MELOXICAM 15 MG PO TABS
15.0000 mg | ORAL_TABLET | Freq: Every day | ORAL | Status: DC
  Administered 2015-12-17 – 2015-12-18 (×2): 15 mg via ORAL
  Filled 2015-12-17 (×4): qty 1

## 2015-12-17 MED ORDER — BUPIVACAINE IN DEXTROSE 0.75-8.25 % IT SOLN
INTRATHECAL | Status: DC | PRN
  Administered 2015-12-17: 2 mL via INTRATHECAL

## 2015-12-17 MED ORDER — ACETAMINOPHEN 650 MG RE SUPP: 650.0000 mg | Freq: Four times a day (QID) | RECTAL | Status: DC | PRN

## 2015-12-17 MED ORDER — LIDOCAINE 2% (20 MG/ML) 5 ML SYRINGE
INTRAMUSCULAR | Status: AC
Start: 2015-12-17 — End: 2015-12-17
  Filled 2015-12-17: qty 5

## 2015-12-17 MED ORDER — DEXAMETHASONE SODIUM PHOSPHATE 10 MG/ML IJ SOLN
INTRAMUSCULAR | Status: AC
  Filled 2015-12-17: qty 1

## 2015-12-17 MED ORDER — ONDANSETRON HCL 4 MG/2ML IJ SOLN: 4.0000 mg | Freq: Once | INTRAMUSCULAR | Status: DC | PRN

## 2015-12-17 MED ORDER — OXYCODONE HCL 5 MG PO TABS
5.0000 mg | ORAL_TABLET | Freq: Four times a day (QID) | ORAL | Status: DC | PRN
Start: 2015-12-17 — End: 2015-12-17
  Administered 2015-12-17: 5 mg via ORAL
  Filled 2015-12-17: qty 1

## 2015-12-17 MED ORDER — PROPOFOL 500 MG/50ML IV EMUL
INTRAVENOUS | Status: DC | PRN
  Administered 2015-12-17: 75 ug/kg/min via INTRAVENOUS

## 2015-12-17 MED ORDER — ACETAMINOPHEN 325 MG PO TABS
650.0000 mg | ORAL_TABLET | Freq: Four times a day (QID) | ORAL | Status: DC
  Administered 2015-12-17 – 2015-12-20 (×13): 650 mg via ORAL
  Filled 2015-12-17 (×13): qty 2

## 2015-12-17 MED ORDER — MORPHINE SULFATE (PF) 4 MG/ML IV SOLN: 4.0000 mg | Freq: Once | INTRAVENOUS | Status: DC

## 2015-12-17 MED ORDER — OXYCODONE-ACETAMINOPHEN 10-325 MG PO TABS
1.0000 | ORAL_TABLET | Freq: Four times a day (QID) | ORAL | Status: DC | PRN
Start: 2015-12-17 — End: 2015-12-17

## 2015-12-17 MED ORDER — DOCUSATE SODIUM 100 MG PO CAPS: 100.0000 mg | ORAL_CAPSULE | Freq: Two times a day (BID) | ORAL | 0 refills | Status: DC

## 2015-12-17 MED ORDER — FENTANYL CITRATE (PF) 100 MCG/2ML IJ SOLN: 25.0000 ug | INTRAMUSCULAR | Status: DC | PRN

## 2015-12-17 MED ORDER — ACETAMINOPHEN 325 MG PO TABS: 650.0000 mg | ORAL_TABLET | Freq: Four times a day (QID) | ORAL | Status: DC | PRN

## 2015-12-17 MED ORDER — ASPIRIN 325 MG PO TABS
325.0000 mg | ORAL_TABLET | Freq: Every day | ORAL | Status: DC
  Administered 2015-12-17 – 2015-12-19 (×3): 325 mg via ORAL
  Filled 2015-12-17 (×3): qty 1

## 2015-12-17 MED ORDER — CEFAZOLIN SODIUM-DEXTROSE 2-4 GM/100ML-% IV SOLN
2.0000 g | INTRAVENOUS | Status: AC
  Administered 2015-12-17: 2 g via INTRAVENOUS

## 2015-12-17 MED ORDER — ONDANSETRON HCL 4 MG/2ML IJ SOLN
INTRAMUSCULAR | Status: DC | PRN
  Administered 2015-12-17: 4 mg via INTRAVENOUS

## 2015-12-17 MED ORDER — BUTALBITAL-APAP-CAFFEINE 50-325-40 MG PO TABS
1.0000 | ORAL_TABLET | Freq: Every day | ORAL | Status: DC | PRN
  Administered 2015-12-17 – 2015-12-19 (×3): 1 via ORAL
  Filled 2015-12-17: qty 1
  Filled 2015-12-17: qty 2
  Filled 2015-12-17: qty 1
  Filled 2015-12-17: qty 2

## 2015-12-17 MED ORDER — MORPHINE SULFATE (PF) 2 MG/ML IV SOLN
2.0000 mg | Freq: Once | INTRAVENOUS | Status: AC
  Administered 2015-12-17: 2 mg via INTRAVENOUS
  Filled 2015-12-17: qty 1

## 2015-12-17 MED ORDER — MORPHINE SULFATE (PF) 2 MG/ML IV SOLN
2.0000 mg | INTRAVENOUS | Status: DC | PRN
  Administered 2015-12-17 – 2015-12-19 (×11): 4 mg via INTRAVENOUS
  Administered 2015-12-19 – 2015-12-20 (×3): 2 mg via INTRAVENOUS
  Filled 2015-12-17 (×4): qty 2
  Filled 2015-12-17: qty 1
  Filled 2015-12-17 (×7): qty 2
  Filled 2015-12-17: qty 1
  Filled 2015-12-17: qty 2

## 2015-12-17 MED ORDER — METOCLOPRAMIDE HCL 10 MG PO TABS
10.0000 mg | ORAL_TABLET | Freq: Four times a day (QID) | ORAL | Status: DC | PRN
  Administered 2015-12-17 – 2015-12-18 (×2): 10 mg via ORAL
  Filled 2015-12-17 (×3): qty 1

## 2015-12-17 MED ORDER — DEXAMETHASONE SODIUM PHOSPHATE 10 MG/ML IJ SOLN
INTRAMUSCULAR | Status: DC | PRN
  Administered 2015-12-17: 10 mg via INTRAVENOUS

## 2015-12-17 MED ORDER — 0.9 % SODIUM CHLORIDE (POUR BTL) OPTIME
TOPICAL | Status: DC | PRN
  Administered 2015-12-17: 1000 mL

## 2015-12-17 MED ORDER — FENTANYL CITRATE (PF) 100 MCG/2ML IJ SOLN
INTRAMUSCULAR | Status: AC
  Filled 2015-12-17: qty 2

## 2015-12-17 MED ORDER — MORPHINE SULFATE (PF) 2 MG/ML IV SOLN
2.0000 mg | INTRAVENOUS | Status: DC | PRN
  Administered 2015-12-17 (×2): 2 mg via INTRAVENOUS
  Filled 2015-12-17 (×2): qty 1

## 2015-12-17 MED ORDER — OXYCODONE HCL 5 MG PO TABS
10.0000 mg | ORAL_TABLET | ORAL | Status: DC | PRN
  Administered 2015-12-17: 22:00:00 15 mg via ORAL
  Administered 2015-12-17: 10 mg via ORAL
  Administered 2015-12-17 – 2015-12-20 (×5): 15 mg via ORAL
  Filled 2015-12-17 (×5): qty 3
  Filled 2015-12-17: qty 2
  Filled 2015-12-17: qty 3

## 2015-12-17 MED ORDER — ADULT MULTIVITAMIN W/MINERALS CH
1.0000 | ORAL_TABLET | Freq: Every day | ORAL | Status: DC
  Administered 2015-12-18 – 2015-12-20 (×3): 1 via ORAL
  Filled 2015-12-17 (×3): qty 1

## 2015-12-17 MED ORDER — CEFAZOLIN SODIUM-DEXTROSE 2-4 GM/100ML-% IV SOLN
INTRAVENOUS | Status: AC
  Filled 2015-12-17: qty 100

## 2015-12-17 SURGICAL SUPPLY — 53 items
BAG ZIPLOCK 12X15 (MISCELLANEOUS) ×3 IMPLANT
BIT DRILL 2.5X2.75 QC CALB (BIT) ×3 IMPLANT
BIT DRILL 3.5X5.5 QC CALB (BIT) ×3 IMPLANT
BIT DRILL CALIBRATED 2.7 (BIT) ×2 IMPLANT
BIT DRILL CALIBRATED 2.7MM (BIT) ×1
BNDG COHESIVE 4X5 TAN STRL (GAUZE/BANDAGES/DRESSINGS) ×3 IMPLANT
CLOSURE WOUND 1/2 X4 (GAUZE/BANDAGES/DRESSINGS) ×1
CUFF TOURN SGL QUICK 34 (TOURNIQUET CUFF) ×2
CUFF TRNQT CYL 34X4X40X1 (TOURNIQUET CUFF) ×1 IMPLANT
DRAPE C-ARM 42X120 X-RAY (DRAPES) ×3 IMPLANT
DRAPE U-SHAPE 47X51 STRL (DRAPES) ×3 IMPLANT
DRSG ADAPTIC 3X8 NADH LF (GAUZE/BANDAGES/DRESSINGS) ×3 IMPLANT
DRSG PAD ABDOMINAL 8X10 ST (GAUZE/BANDAGES/DRESSINGS) ×3 IMPLANT
DURAPREP 26ML APPLICATOR (WOUND CARE) ×3 IMPLANT
ELECT REM PT RETURN 9FT ADLT (ELECTROSURGICAL) ×3
ELECTRODE REM PT RTRN 9FT ADLT (ELECTROSURGICAL) ×1 IMPLANT
GAUZE SPONGE 4X4 12PLY STRL (GAUZE/BANDAGES/DRESSINGS) ×3 IMPLANT
GLOVE BIOGEL PI IND STRL 7.5 (GLOVE) ×1 IMPLANT
GLOVE BIOGEL PI IND STRL 8 (GLOVE) ×1 IMPLANT
GLOVE BIOGEL PI INDICATOR 7.5 (GLOVE) ×2
GLOVE BIOGEL PI INDICATOR 8 (GLOVE) ×2
GLOVE ECLIPSE 8.0 STRL XLNG CF (GLOVE) IMPLANT
GLOVE ORTHO TXT STRL SZ7.5 (GLOVE) ×6 IMPLANT
GLOVE SURG ORTHO 8.0 STRL STRW (GLOVE) ×3 IMPLANT
GOWN SPEC L3 XXLG W/TWL (GOWN DISPOSABLE) ×6 IMPLANT
GOWN STRL REUS W/TWL LRG LVL3 (GOWN DISPOSABLE) ×3 IMPLANT
MANIFOLD NEPTUNE II (INSTRUMENTS) ×3 IMPLANT
NS IRRIG 1000ML POUR BTL (IV SOLUTION) ×3 IMPLANT
PACK ORTHO EXTREMITY (CUSTOM PROCEDURE TRAY) ×3 IMPLANT
PAD CAST 4YDX4 CTTN HI CHSV (CAST SUPPLIES) ×2 IMPLANT
PADDING CAST ABS 4INX4YD NS (CAST SUPPLIES) ×2
PADDING CAST ABS COTTON 4X4 ST (CAST SUPPLIES) ×1 IMPLANT
PADDING CAST COTTON 4X4 STRL (CAST SUPPLIES) ×4
PLATE LOCK 8H 103 BILAT FIB (Plate) ×3 IMPLANT
POSITIONER SURGICAL ARM (MISCELLANEOUS) ×3 IMPLANT
SCREW CORTICAL 3.5MM  16MM (Screw) ×2 IMPLANT
SCREW CORTICAL 3.5MM 16MM (Screw) ×1 IMPLANT
SCREW LOCK CORT STAR 3.5X10 (Screw) ×9 IMPLANT
SCREW LOCK CORT STAR 3.5X12 (Screw) ×3 IMPLANT
SCREW LOCK CORT STAR 3.5X14 (Screw) ×6 IMPLANT
SCREW LOCK CORT STAR 3.5X16 (Screw) ×3 IMPLANT
SCREW LOW PROFILE 12MMX3.5MM (Screw) ×3 IMPLANT
SCREW NLOCK CANC HEX 4X16 (Screw) ×3 IMPLANT
SCREW NON LOCKING LP 3.5 14MM (Screw) ×3 IMPLANT
SPLINT FIBERGLASS 4X30 (CAST SUPPLIES) ×3 IMPLANT
STRIP CLOSURE SKIN 1/2X4 (GAUZE/BANDAGES/DRESSINGS) ×2 IMPLANT
SUT ETHILON 3 0 PS 1 (SUTURE) ×3 IMPLANT
SUT MNCRL AB 4-0 PS2 18 (SUTURE) ×3 IMPLANT
SUT VIC AB 1 CT1 27 (SUTURE) ×4
SUT VIC AB 1 CT1 27XBRD ANTBC (SUTURE) ×2 IMPLANT
SUT VIC AB 2-0 CT1 27 (SUTURE) ×2
SUT VIC AB 2-0 CT1 TAPERPNT 27 (SUTURE) ×1 IMPLANT
TOWEL OR 17X26 10 PK STRL BLUE (TOWEL DISPOSABLE) ×6 IMPLANT

## 2015-12-17 NOTE — Anesthesia Procedure Notes (Signed)
Spinal  Patient location during procedure: OR Start time: 12/17/2015 7:46 AM Staffing Anesthesiologist: Josephine Igo Performed: anesthesiologist  Preanesthetic Checklist Completed: patient identified, site marked, surgical consent, pre-op evaluation, timeout performed, IV checked, risks and benefits discussed and monitors and equipment checked Spinal Block Patient position: sitting Prep: DuraPrep Patient monitoring: heart rate, cardiac monitor, continuous pulse ox and blood pressure Approach: midline Location: L3-4 Injection technique: single-shot Needle Needle type: Sprotte  Needle gauge: 24 G Needle length: 9 cm Assessment Sensory level: T4 Additional Notes Multiple attempts. Difficult due to position. Patient tolerated procedure well. Adequate sensory level.

## 2015-12-17 NOTE — Discharge Instructions (Signed)
Discharge Instructions -- Lower Extremity Fracture   You will have a dressing with a plaster splint incorporated in it. Elevate your foot to reduce pain & swelling of the toes.  Ice over the operative site may be helpful to reduce pain & swelling.  DO NOT USE HEAT. DO NOT PLACE ANY WEIGHT UPON THE INJURED LOWER EXTREMITY! Pain medicine has been prescribed for you Use your medicine as needed over the first 48 hours, and then you can begin to taper your use. You may take Extra Strength Tylenol or Tylenol only in place of the pain pills.  Leave the dressing in place until you return to our office.  You may shower, but keep the bandage clean & dry.  You may not drive a car if safe operating requires the use of the injured extremity. You may have already made your follow-up appointment when we completed your preop visit.  If not, please call our office today or the next business day to make your return appointment for 10-15 days after surgery.     Please call [phone] during normal business hours or [phone] after hours for any problems. Including the following:  - excessive redness of the incisions - drainage for more than 4 days - fever of more than 101.5 F  *Please note that pain medications will not be refilled after hours or on weekends.

## 2015-12-17 NOTE — Progress Notes (Signed)
[place] split per orders by orthotech. Keonda Dow, [employer]

## 2015-12-17 NOTE — H&P (Signed)
ORTHOPAEDIC CONSULTATION HISTORY & PHYSICAL REQUESTING PHYSICIAN: Milly Jakob, MD  Chief Complaint: Left ankle injury  HPI: Sherri [name] is a 60 y.o. female who Slipped and fell this evening around 7:30, causing immediate onset of pain, and deformity of the left ankle, with inability to bear weight.  She was brought to the emergency department via EMS, where x-rays of the obtained revealing a functional bimalleolar ankle fracture with subluxation.  Past Medical History:  Diagnosis Date  . Anxiety   . Breast pain, right   . Depression   . Hypertension    not taken atenolol or lisinopril in >55mos, no money  . Migraines   . Ruptured disk    Past Surgical History:  Procedure Laterality Date  . BREAST SURGERY     lumpectomy in 20s for benign mass with implant placement (subglandular)  . CARPAL TUNNEL RELEASE     right wrist  . CARPAL TUNNEL RELEASE Right 09/02/2014   Procedure: RIGHT CARPAL TUNNEL RELEASE;  Surgeon: Melrose Nakayama, MD;  Location: [facility];  Service: Orthopedics;  Laterality: Right;  . CERVICAL CONE BIOPSY    . KNEE ARTHROSCOPY  07/1996  . SPINE SURGERY  72011   L2-L5 stabilization   Social History   Social History  . Marital status: Widowed    Spouse name: Patrick Jupiter  . Number of children: 0  . Years of education: 15   Occupational History  . UNEMPLOYED Unemployed\   Social History Main Topics  . Smoking status: Former Smoker    Years: 1.00    Types: Cigarettes    Quit date: 01/08/1976  . Smokeless tobacco: Never Used  . Alcohol use No  . Drug use: No  . Sexual activity: Not Asked   Other Topics Concern  . None   Social History Narrative   Husband died October 17, 2011 of end-stage COPD.  Adversarial relationship with his ex-wife who lives in a house he was paying for.  Attorneys for both parties are involved in attempt to settle the conflict and financial matters of his will.   Family History  Problem Relation Age of Onset  . Diabetes  Mother   . Glaucoma Mother   . Cancer Mother     breast, ovarian  . Heart disease Mother   . Gout Father   . Cancer Sister     ovarian, uterine cancer   Allergies  Allergen Reactions  . Effexor [Venlafaxine Hydrochloride] Shortness Of Breath, Rash and Other (See Comments)    HALLUCINATIONS ALSO  . Iodine Anaphylaxis  . Shellfish Allergy Anaphylaxis  . Flexeril [Cyclobenzaprine Hcl] Other (See Comments)    HALLUCINATIONS AND "OUT OF CONTROL"  . Hydromorphone Other (See Comments)    HALLUCINATIONS  . Nubain [Nalbuphine Hcl] Other (See Comments)    HALLUCINATIONS  . Codeine Swelling and Rash  . Erythromycin Rash   Prior to Admission medications   Medication Sig Start Date End Date Taking? Authorizing Provider  aspirin (ASPIRIN CHILDRENS) 81 MG chewable tablet Chew 1 tablet (81 mg total) by mouth daily. 12/11/15  Yes Sedalia Muta, FNP  Butalbital-Acetaminophen 50-300 MG TABS Take 1-2 tablets by mouth daily as needed for migraine.   Yes Historical Provider, MD  hydrochlorothiazide (HYDRODIURIL) 12.5 MG tablet Take 12.5 mg by mouth daily.   Yes Historical Provider, MD  meloxicam (MOBIC) 15 MG tablet Take 15 mg by mouth daily.   Yes Historical Provider, MD  metaxalone (SKELAXIN) 800 MG tablet Take 1 tablet (800 mg total) by mouth  3 (three) times daily as needed for pain. 02/27/12  Yes Chelle Jeffery, PA-C  metoCLOPramide (REGLAN) 10 MG tablet Take 1 tablet (10 mg total) by mouth every 6 (six) hours as needed for nausea (or headache). 0000000  Yes Delora Fuel, MD  Multiple Vitamin (MULTIVITAMIN WITH MINERALS) TABS tablet Take 1 tablet by mouth daily.   Yes Historical Provider, MD  oxyCODONE-acetaminophen (PERCOCET) 10-325 MG per tablet Take 1 tablet by mouth every 6 (six) hours as needed for pain.    Yes Historical Provider, MD  Phenyleph-Doxylamine-DM-APAP (ALKA-SELTZER PLS NIGHT CLD/FLU PO) Take 1 each by mouth 2 (two) times daily as needed (congestion).   Yes Historical  Provider, MD  valACYclovir (VALTREX) 500 MG tablet Take 500 mg by mouth 2 (two) times daily.   Yes Historical Provider, MD   Dg Ankle Complete Left  Result Date: 12/16/2015 CLINICAL DATA:  Slip and fall on ice with left ankle pain, edema and deformity. EXAM: LEFT ANKLE COMPLETE - 3+ VIEW COMPARISON:  None. FINDINGS: Oblique mildly displaced distal fibular fracture just proximal to the ankle mortise. There is a mildly displaced posterior tibial tubercle fracture. Marked widening of the medial clear space consistent with ligamentous injury. Diffuse soft tissue edema. IMPRESSION: Trimalleolar injury with displaced fractures of the distal fibula and posterior tibial tubercle. Widening of the medial clear space is consistent with ligamentous injury, medial malleolus fracture component is not seen. Electronically Signed   By: Jeb Levering M.D.   On: 12/16/2015 22:21   Dg Chest Portable 1 View  Result Date: 12/16/2015 CLINICAL DATA:  Acute ankle fracture. EXAM: PORTABLE CHEST 1 VIEW COMPARISON:  12/11/2015 FINDINGS: A single AP portable view of the chest demonstrates no focal airspace consolidation or alveolar edema. The lungs are grossly clear. There is no large effusion or pneumothorax. Cardiac and mediastinal contours appear unremarkable. IMPRESSION: No active disease. Electronically Signed   By: Andreas Newport M.D.   On: 12/16/2015 22:49    Positive ROS: All other systems have been reviewed and were otherwise negative with the exception of those mentioned in the HPI and as above.  Physical Exam: Vitals: Refer to EMR. Constitutional:  WD, WN, NAD HEENT:  NCAT, EOMI Neuro/Psych:  Alert & oriented to person, place, and time; appropriate mood & affect Lymphatic: No generalized extremity edema or lymphadenopathy Extremities / MSK:  The extremities are normal with respect to appearance, ranges of motion, joint stability, muscle strength/tone, sensation, & perfusion except as otherwise  noted:  Left lower extremity with palpable dorsalis pedis pulse, warm toes with brisk capillary refill.  Intact flexion extension of the toes.  The foot appears shifted laterally and posteriorly.  No pain with palpation proximal to the ankle.  Positive lateral and medial tenderness at the ankle  Assessment: Displaced left functional bimalleolar ankle/subluxation  Plan: I discussed these findings with her and the indications for surgery.  In the interim, 10 mL of plain lidocaine was instilled into the ankle joint and this assisted in reduction and splinting.  Short leg splint was applied with the ankle held in more anatomic alignment. No change in neurovascular exam following application of the splint.  Pain improved following splinting.  She last ate about 7 PM, and so her surgery has been scheduled for 0 7:30 tomorrow.  She lives at home, alone, with about 3 steps to accomplish.  She will likely be discharged once she clears physical therapy for her home environment.  I suspect that she will not require placement in short-term  rehabilitation. I discussed the goals, risks, and options regarding operative treatment, was likely just lateral approach and lateral plating of the fibula.  I suspect a syndesmotic screw will not be required, but we will check with a stress view after plating the fibula.  Consent was obtained and document executed.  Rayvon Char Grandville Silos, [address] [place], [place]  [id number] Office: [phone] Mobile: [phone]  12/17/2015, 12:23 AM

## 2015-12-17 NOTE — Anesthesia Preprocedure Evaluation (Addendum)
Anesthesia Evaluation  Patient identified by MRN, date of birth, ID band Patient awake    Reviewed: Allergy & Precautions, NPO status , Patient's Chart, lab work & pertinent test results  Airway Mallampati: II  TM Distance: >3 FB Neck ROM: Full    Dental no notable dental hx. (+) Caps   Pulmonary former smoker,    Pulmonary exam normal breath sounds clear to auscultation       Cardiovascular hypertension, Pt. 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 Lab Results  Component Value Date   WBC 10.2 12/16/2015   HGB 12.0 12/16/2015   HCT 36.9 12/16/2015   MCV 86.4 12/16/2015   PLT 269 12/16/2015     Chemistry      Component Value Date/Time   NA 141 12/16/2015 2243   K 3.4 (L) 12/16/2015 2243   CL 106 12/16/2015 2243   CO2 28 12/16/2015 2243   BUN 20 12/16/2015 2243   CREATININE 0.70 12/16/2015 2243   CREATININE 0.50 09/23/2013 1405      Component Value Date/Time   CALCIUM 9.1 12/16/2015 2243   ALKPHOS 67 09/23/2013 1405   AST 42 (H) 09/23/2013 1405   ALT 28 09/23/2013 1405   BILITOT 0.4 09/23/2013 1405      Anesthesia Physical Anesthesia Plan  ASA: II  Anesthesia Plan: Spinal   Post-op Pain Management:    Induction:   Airway Management Planned: Natural Airway and Nasal Cannula  Additional Equipment:   Intra-op Plan:   Post-operative Plan:   Informed Consent: I  have reviewed the patients History and Physical, chart, labs and discussed the procedure including the risks, benefits and alternatives for the proposed anesthesia with the patient or authorized representative who has indicated his/her understanding and acceptance.     Plan Discussed with: Anesthesiologist, CRNA and Surgeon  Anesthesia Plan Comments:         Anesthesia Quick Evaluation

## 2015-12-17 NOTE — Transfer of Care (Signed)
Immediate Anesthesia Transfer of Care Note  Patient: EDIT JIMINIAN  Procedure(s) Performed: Procedure(s): OPEN REDUCTION INTERNAL FIXATION (ORIF) ANKLE FRACTURE (Left)  Patient Location: PACU  Anesthesia Type:Spinal  Level of Consciousness: awake, alert  and oriented  Airway & Oxygen Therapy: Patient Spontanous Breathing and Patient connected to face mask oxygen  Post-op Assessment: Report given to RN and Post -op Vital signs reviewed and stable  Post vital signs: Reviewed and stable  Last Vitals:  Vitals:   12/17/15 0115 12/17/15 0550  BP: (!) 159/71 133/68  Pulse: 96 86  Resp: 20 18  Temp: 36.9 C 36.9 C    Last Pain:  Vitals:   12/17/15 0620  TempSrc:   PainSc: 10-Worst pain ever      Patients Stated Pain Goal: 4 ([id number])  Complications: No apparent anesthesia complications

## 2015-12-17 NOTE — Progress Notes (Signed)
Pt complaining of 10/10 pain to left ankle unrelieved by 2mg  of IV morphine given at 05:14 and percocet 10/325mg . Notified Dr. Grandville Silos, new orders to give 1 additional 2mg  dose of IV morphine.

## 2015-12-17 NOTE — Op Note (Signed)
12/16/2015 - 12/17/2015  11:38 AM  PATIENT:  Sherri [name]  60 y.o. female  PRE-OPERATIVE DIAGNOSIS:  Left displaced functional bimalleolar ankle fracture  POST-OPERATIVE DIAGNOSIS:  Same  PROCEDURE:  Open treatment of left displaced functional bimalleolar ankle fracture with fibular plating  SURGEON: Rayvon Char. Grandville Silos, MD  PHYSICIAN ASSISTANT: None  ANESTHESIA:  spinal  SPECIMENS:  None  DRAINS:   None  EBL:  less than 50 mL  PREOPERATIVE INDICATIONS:  Sherri [name] is a  60 y.o. female with a displaced left ankle functional bimalleolar fracture  The risks benefits and alternatives were discussed with the patient preoperatively including but not limited to the risks of infection, bleeding, nerve injury, cardiopulmonary complications, the need for revision surgery, among others, and the patient verbalized understanding and consented to proceed.  OPERATIVE IMPLANTS: Biomet fibular plate, 8 hole, with interfragmentary screw  OPERATIVE PROCEDURE:  After receiving prophylactic antibiotics, the patient was escorted to the operative theatre and placed in a supine position.  A surgical "time-out" was performed during which the planned procedure, proposed operative site, and the correct patient identity were compared to the operative consent and agreement confirmed by the circulating nurse according to current facility policy.  She was repositioned sitting up and anesthesia administered a spinal block.  Following application of a tourniquet to the operative extremity, the exposed skin was prepped with DuraPrep and draped in the usual sterile fashion.  The limb was exsanguinated with an Esmarch bandage and the tourniquet inflated to approximately 156mmHg higher than systolic BP.  A direct lateral approach was made sharply with a scalpel.  Subcutaneous tissues were dissected with a combination of blunt and sharp dissection.  Care was taken to search for the superficial peroneal nerve and  it was not encountered in the approach.  The periosteum was split longitudinally and reflected anteriorly and posteriorly.  The fracture was identified, opened and cleansed and clot was removed.  It was provisionally reduced and held with clamps.  Fluoroscopic assessment revealed near-anatomic alignment.  With it such held, the Biomet 8 hole fibular plate was selected and placed proximally first with a combination of nonlocking and locking screws.  Next interfragmentary screw was placed in standard fashion although the purchase with a 3.5 mm screw was not as good as I would like, so it was exchanged for a 49mm cancellus screw.  The distal holes were subsequently filled as well with care to try to position the plate slightly posterior of the midline.  The clamps were removed, the ankle found to be reduced and stable with applied external rotation stress.  There was no intra-articular hardware.  Final fluoroscopic images were obtained.  The wound was again irrigated and the periosteum and fascial layers closed with 2-0 Vicryl interrupted suture.  Tourniquet was released, some additional hemostasis obtained and the skin was closed with a combination of 2-0 Vicryl deep dermal buried sutures and staples in the skin.  A short leg splint dressing was applied and she was taken to the recovery room in stable condition.  Care was taken not to have hard components of the splint under the prominence of the heel. DISPOSITION: She will return to the floor for continued management, physical therapy for crutch training and determination regarding safety for home discharge.  She'll be discharged home when medically stable and cleared by PT.  She remain touchdown weightbearing left lower extremity

## 2015-12-17 NOTE — Progress Notes (Signed)
Pt in tears, c/o 10/10 pain w/sensations to left ankle area of lateral, distal and medial pressure as well as burning and tightness. Also c/o tingling and pins-and-needles to foot & toes. Dr Grandville Silos notified by phone & orders received. He also shared that pt had some muscle involvement that could explain her pain level as well as hx of chronic pain & treatment that could raise her base-line pain med needs. Kirill Chatterjee, [employer]

## 2015-12-17 NOTE — Anesthesia Postprocedure Evaluation (Signed)
Anesthesia Post Note  Patient: Sherri [name]  Procedure(s) Performed: Procedure(s) (LRB): OPEN REDUCTION INTERNAL FIXATION (ORIF) ANKLE FRACTURE (Left)  Patient location during evaluation: PACU Anesthesia Type: Spinal Level of consciousness: awake and alert and oriented Pain management: pain level controlled Vital Signs Assessment: post-procedure vital signs reviewed and stable Respiratory status: spontaneous breathing, nonlabored ventilation, respiratory function stable and patient connected to nasal cannula oxygen Cardiovascular status: blood pressure returned to baseline and stable Postop Assessment: no backache, no headache, spinal receding and no signs of nausea or vomiting Anesthetic complications: no    Last Vitals:  Vitals:   12/17/15 1000 12/17/15 1015  BP: (!) 155/85 (!) 145/85  Pulse: 86 83  Resp: (!) 23 12  Temp:  36.7 C    Last Pain:  Vitals:   12/17/15 0620  TempSrc:   PainSc: 10-Worst pain ever                 Konrad Hoak A.

## 2015-12-17 NOTE — Progress Notes (Signed)
PT Cancellation Note  Patient Details Name: NICKELLE BARMAN MRN: [id number] DOB: [birth year]   Cancelled Treatment:    Reason Eval/Treat Not Completed: Pain limiting ability to participate (pt had surgery this morning, she is reporting a possible spinal headache. Will attempt PT eval tomorrow. )   Philomena Doheny 12/17/2015, 2:27 PM [phone]

## 2015-12-18 ENCOUNTER — Encounter (HOSPITAL_COMMUNITY): Payer: Self-pay | Admitting: Orthopedic Surgery

## 2015-12-18 DIAGNOSIS — H5461 Unqualified visual loss, right eye, normal vision left eye: Secondary | ICD-10-CM | POA: Diagnosis present

## 2015-12-18 DIAGNOSIS — H5440 Blindness, one eye, unspecified eye: Secondary | ICD-10-CM | POA: Diagnosis present

## 2015-12-18 DIAGNOSIS — I10 Essential (primary) hypertension: Secondary | ICD-10-CM | POA: Diagnosis present

## 2015-12-18 DIAGNOSIS — Z9109 Other allergy status, other than to drugs and biological substances: Secondary | ICD-10-CM | POA: Diagnosis not present

## 2015-12-18 DIAGNOSIS — Z7982 Long term (current) use of aspirin: Secondary | ICD-10-CM | POA: Diagnosis not present

## 2015-12-18 DIAGNOSIS — Z560 Unemployment, unspecified: Secondary | ICD-10-CM | POA: Diagnosis not present

## 2015-12-18 DIAGNOSIS — W000XXA Fall on same level due to ice and snow, initial encounter: Secondary | ICD-10-CM | POA: Diagnosis present

## 2015-12-18 DIAGNOSIS — S82892A Other fracture of left lower leg, initial encounter for closed fracture: Secondary | ICD-10-CM | POA: Diagnosis not present

## 2015-12-18 DIAGNOSIS — I208 Other forms of angina pectoris: Secondary | ICD-10-CM | POA: Diagnosis not present

## 2015-12-18 DIAGNOSIS — K5900 Constipation, unspecified: Secondary | ICD-10-CM | POA: Diagnosis present

## 2015-12-18 DIAGNOSIS — Z91120 Patient's intentional underdosing of medication regimen due to financial hardship: Secondary | ICD-10-CM | POA: Diagnosis not present

## 2015-12-18 DIAGNOSIS — Z87891 Personal history of nicotine dependence: Secondary | ICD-10-CM | POA: Diagnosis not present

## 2015-12-18 DIAGNOSIS — R079 Chest pain, unspecified: Secondary | ICD-10-CM | POA: Diagnosis not present

## 2015-12-18 DIAGNOSIS — Z881 Allergy status to other antibiotic agents status: Secondary | ICD-10-CM | POA: Diagnosis not present

## 2015-12-18 DIAGNOSIS — Z791 Long term (current) use of non-steroidal anti-inflammatories (NSAID): Secondary | ICD-10-CM | POA: Diagnosis not present

## 2015-12-18 DIAGNOSIS — Z91013 Allergy to seafood: Secondary | ICD-10-CM | POA: Diagnosis not present

## 2015-12-18 DIAGNOSIS — S82842A Displaced bimalleolar fracture of left lower leg, initial encounter for closed fracture: Secondary | ICD-10-CM | POA: Diagnosis present

## 2015-12-18 DIAGNOSIS — R0789 Other chest pain: Secondary | ICD-10-CM | POA: Diagnosis not present

## 2015-12-18 DIAGNOSIS — F418 Other specified anxiety disorders: Secondary | ICD-10-CM | POA: Diagnosis present

## 2015-12-18 DIAGNOSIS — Z79899 Other long term (current) drug therapy: Secondary | ICD-10-CM | POA: Diagnosis not present

## 2015-12-18 DIAGNOSIS — Z885 Allergy status to narcotic agent status: Secondary | ICD-10-CM | POA: Diagnosis not present

## 2015-12-18 DIAGNOSIS — B009 Herpesviral infection, unspecified: Secondary | ICD-10-CM | POA: Diagnosis not present

## 2015-12-18 DIAGNOSIS — A6000 Herpesviral infection of urogenital system, unspecified: Secondary | ICD-10-CM | POA: Diagnosis present

## 2015-12-18 DIAGNOSIS — E785 Hyperlipidemia, unspecified: Secondary | ICD-10-CM | POA: Diagnosis present

## 2015-12-18 DIAGNOSIS — T464X6A Underdosing of angiotensin-converting-enzyme inhibitors, initial encounter: Secondary | ICD-10-CM | POA: Diagnosis present

## 2015-12-18 MED ORDER — PROMETHAZINE HCL 25 MG/ML IJ SOLN
12.5000 mg | Freq: Four times a day (QID) | INTRAMUSCULAR | Status: DC | PRN
  Administered 2015-12-18: 11:00:00 12.5 mg via INTRAVENOUS
  Filled 2015-12-18: qty 1

## 2015-12-18 MED ORDER — PROMETHAZINE HCL 25 MG RE SUPP: 25.0000 mg | Freq: Four times a day (QID) | RECTAL | Status: DC | PRN

## 2015-12-18 MED ORDER — OXYCODONE HCL ER 20 MG PO T12A
20.0000 mg | EXTENDED_RELEASE_TABLET | Freq: Two times a day (BID) | ORAL | Status: DC
  Administered 2015-12-18 – 2015-12-20 (×4): 20 mg via ORAL
  Filled 2015-12-18 (×4): qty 1

## 2015-12-18 MED ORDER — PROMETHAZINE HCL 25 MG PO TABS
25.0000 mg | ORAL_TABLET | Freq: Four times a day (QID) | ORAL | Status: DC | PRN
  Administered 2015-12-18 – 2015-12-20 (×3): 25 mg via ORAL
  Filled 2015-12-18 (×3): qty 1

## 2015-12-18 NOTE — Progress Notes (Signed)
Physical Therapy Treatment Patient Details Name: Sherri [name] MRN: [id number] DOB: Sep 15, 1955 Today's Date: 12/18/2015    History of Present Illness s/p ORIF L ankle    PT Comments    Pt limited by pain this pm but agreeable to PT within limits of pain; pt may benefit from SNF if this is an option; recommend HHPT vs SNF depending on D/C plan/available support; pt also has 2 small dogs she is worried about taking care of; will see how she does tomorrow  Follow Up Recommendations  Home health PT (pt would benefit from SNF but can't pay out of pocket)     Equipment Recommendations  Rolling walker with 5" wheels;3in1 (PT)    Recommendations for Other Services       Precautions / Restrictions Precautions Precautions: Fall Restrictions LLE Weight Bearing: Touchdown weight bearing    Mobility  Bed Mobility Overal bed mobility: Needs Assistance Bed Mobility: Sit to Supine     Supine to sit: Min guard Sit to supine: Min assist;Min guard   General bed mobility comments: multi-modal cues for sequence, self assist; incr time  Transfers Overall transfer level: Needs assistance Equipment used: Rolling walker (2 wheeled) Transfers: Sit to/from [employer] Sit to Stand: Min assist Stand pivot transfers: Min assist       General transfer comment: assist to rise and stabilize  Ambulation/Gait Ambulation/Gait assistance: Min guard Ambulation Distance (Feet): 5 Feet Assistive device: Rolling walker (2 wheeled)       General Gait Details: cues for sequence, TDWB, RW position   Stairs            Wheelchair Mobility    Modified Rankin (Stroke Patients Only)       Balance Overall balance assessment: Needs assistance;History of Falls         Standing balance support: Bilateral upper extremity supported Standing balance-Leahy Scale: Poor Standing balance comment: reliant on UEs                    Cognition Arousal/Alertness:  Awake/alert Behavior During Therapy: WFL for tasks assessed/performed Overall Cognitive Status: Within Functional Limits for tasks assessed                      Exercises      General Comments        Pertinent Vitals/Pain Pain Location: back and L ankle Pain Descriptors / Indicators: Aching Pain Intervention(s): Premedicated before session;Monitored during session;Repositioned    Home Living                 Additional Comments: pt reports she has boxes "head high" in her apt but thinks she will have enough room to maneuver with the walker; pt also states she let her "apt go" (?)    Prior Function            PT Goals (current goals can now be found in the care plan section) Acute Rehab PT Goals Patient Stated Goal: none stated PT Goal Formulation: With patient Time For Goal Achievement: 12/22/15 Potential to Achieve Goals: Good Progress towards PT goals: Progressing toward goals    Frequency    7X/week      PT Plan Current plan remains appropriate    Co-evaluation             End of Session Equipment Utilized During Treatment: Gait belt Activity Tolerance: Patient tolerated treatment well;Patient limited by fatigue Patient left: with call bell/phone within reach;in bed;with bed  alarm set     Time: [id number] PT Time Calculation (min) (ACUTE ONLY): 12 min  Charges:  $Therapeutic Activity: 8-22 mins                    G Codes:  Functional Assessment Tool Used: clinical judgement Functional Limitation: Mobility: Walking and moving around Mobility: Walking and Moving Around Current Status [id number]): At least 1 percent but less than 20 percent impaired, limited or restricted Mobility: Walking and Moving Around Goal Status [phone]): At least 1 percent but less than 20 percent impaired, limited or restricted   [facility] 12/18/2015, 3:51 PM

## 2015-12-18 NOTE — Evaluation (Signed)
Physical Therapy Evaluation Patient Details Name: Sherri [name] MRN: [id number] DOB: 1955/06/04 Today's Date: 12/18/2015   History of Present Illness  s/p ORIF L ankle  Clinical Impression  Pt admitted with above diagnosis. Pt currently with functional limitations due to the deficits listed below (see PT Problem List). * Pt will benefit from skilled PT to increase their independence and safety with mobility to allow discharge to the venue listed below.   Will continue to follow, it sounds as if pt is space challenged at home, therefore apt is not w/c accessible; she has ~5 steps to enter aptas well which may be a barrier    Follow Up Recommendations Home health PT    Equipment Recommendations  Rolling walker with 5" wheels;3in1 (PT) (no space for w/c)    Recommendations for Other Services       Precautions / Restrictions Precautions Precautions: Fall Restrictions LLE Weight Bearing: Touchdown weight bearing      Mobility  Bed Mobility Overal bed mobility: Needs Assistance Bed Mobility: Supine to Sit     Supine to sit: Min guard     General bed mobility comments: multi-modal cues for sequence, self assist; incr time  Transfers Overall transfer level: Needs assistance Equipment used: Rolling walker (2 wheeled) Transfers: Sit to/from Stand Sit to Stand: Min assist         General transfer comment: assist to rise and stabilize  Ambulation/Gait Ambulation/Gait assistance: Min assist Ambulation Distance (Feet): 20 Feet Assistive device: Rolling walker (2 wheeled)       General Gait Details: cues for sequence, TDWB, RW position  Stairs            Wheelchair Mobility    Modified Rankin (Stroke Patients Only)       Balance Overall balance assessment: Needs assistance;History of Falls         Standing balance support: Bilateral upper extremity supported Standing balance-Leahy Scale: Poor Standing balance comment: reliant on UEs                              Pertinent Vitals/Pain Pain Assessment: 0-10 Pain Score: 5  Pain Location: back and L ankle Pain Descriptors / Indicators: Aching Pain Intervention(s): Limited activity within patient's tolerance;Monitored during session;Premedicated before session    Home Living Family/patient expects to be discharged to:: Private residence Living Arrangements: Alone   Type of Home: Apartment Home Access: Stairs to enter   Technical brewer of Steps: 3 and 2 Home Layout: One level Home Equipment: None Additional Comments: pt reports she has boxes "head high" in her apt but thinks she will have enough room to maneuver with the walker; pt also states she let her "apt go" (?)    Prior Function Level of Independence: Independent               Hand Dominance        Extremity/Trunk Assessment   Upper Extremity Assessment: Overall WFL for tasks assessed           Lower Extremity Assessment: LLE deficits/detail   LLE Deficits / Details: knee extension  and hip flexion 3/5     Communication   Communication: No difficulties  Cognition Arousal/Alertness: Awake/alert Behavior During Therapy: WFL for tasks assessed/performed Overall Cognitive Status: Within Functional Limits for tasks assessed                      General Comments  Exercises     Assessment/Plan    PT Assessment Patient needs continued PT services  PT Problem List Decreased strength;Decreased activity tolerance;Decreased mobility;Decreased knowledge of use of DME;Decreased knowledge of precautions          PT Treatment Interventions DME instruction;Gait training;Functional mobility training;Therapeutic exercise;Therapeutic activities;Stair training;Patient/family education    PT Goals (Current goals can be found in the Care Plan section)  Acute Rehab PT Goals Patient Stated Goal: none stated PT Goal Formulation: With patient Time For Goal Achievement:  12/22/15 Potential to Achieve Goals: Good    Frequency 7X/week   Barriers to discharge        Co-evaluation               End of Session Equipment Utilized During Treatment: Gait belt Activity Tolerance: Patient tolerated treatment well;Patient limited by fatigue Patient left: in chair;with call bell/phone within reach      Functional Assessment Tool Used: clinical judgement Functional Limitation: Mobility: Walking and moving around Mobility: Walking and Moving Around Current Status [id number]): At least 1 percent but less than 20 percent impaired, limited or restricted Mobility: Walking and Moving Around Goal Status [phone]): At least 1 percent but less than 20 percent impaired, limited or restricted    Time: 1123-1145 PT Time Calculation (min) (ACUTE ONLY): 22 min   Charges:   PT Evaluation $PT Eval Low Complexity: 1 Procedure     PT G Codes:   PT G-Codes **NOT FOR INPATIENT CLASS** Functional Assessment Tool Used: clinical judgement Functional Limitation: Mobility: Walking and moving around Mobility: Walking and Moving Around Current Status [id number]): At least 1 percent but less than 20 percent impaired, limited or restricted Mobility: Walking and Moving Around Goal Status [phone]): At least 1 percent but less than 20 percent impaired, limited or restricted    [facility] 12/18/2015, 12:04 PM

## 2015-12-18 NOTE — Progress Notes (Signed)
Spoke with patient at bedside. States she lives alone and does not have anyone that could help. Would like to consider SNF. Discussed with her limitations of coverage and ability to pay out of pocket. PT eval recommending home with HHPT, min assist but only walked 35ft. Follow up with patient after PT, patient states she has a friend who says she can stay with her, she will go to [address], [place], [place] [address], phone [phone]. Pain and immobility currently is barrier to d/c. Contacted [facility] and they can provide PT services to patient at d/c.

## 2015-12-18 NOTE — Progress Notes (Signed)
  PATIENT ID: Sherri [name]  MRN: [id number]  DOB/AGE:  Apr 12, 1955 / 60 y.o.  1 Day Post-Op Procedure(s) (LRB): OPEN REDUCTION INTERNAL FIXATION (ORIF) ANKLE FRACTURE (Left)  Subjective: Pain is moderate, severe and worse if dependent.  No c/o chest pain or SOB.   Having migraine HA Was up with PT earlier today   Objective: Vital signs in last 24 hours: Temp:  [98.3 F (36.8 C)-98.8 F (37.1 C)] 98.8 F (37.1 C) (12/11 0441) Pulse Rate:  [84-96] 92 (12/11 0441) Resp:  [15-18] 18 (12/11 0441) BP: (153-169)/(69-83) 169/83 (12/11 0441) SpO2:  [98 %-100 %] 100 % (12/11 0441)  Intake/Output from previous day: 12/10 0701 - 12/11 0700 In: 2900 [P.O.:750; I.V.:2150] Out: 3850 [Urine:3850] Intake/Output this shift: Total I/O In: 240 [P.O.:240] Out: 300 [Urine:300]   Recent Labs  12/16/15 2243  HGB 12.0    Recent Labs  12/16/15 2243  WBC 10.2  RBC 4.27  HCT 36.9  PLT 269    Recent Labs  12/16/15 2243  NA 141  K 3.4*  CL 106  CO2 28  BUN 20  CREATININE 0.70  GLUCOSE 120*  CALCIUM 9.1   No results for input(s): LABPT, INR in the last 72 hours.  Physical Exam: ACE removed from splint, ankle and distal leg swollen,  But not tense.  Can flex/ext toes actively without prohibitive pain.  Toes warm, CR brisk, DP palp Calf soft, supple  Assessment/Plan: 1 Day Post-Op Procedure(s) (LRB): OPEN REDUCTION INTERNAL FIXATION (ORIF) ANKLE FRACTURE (Left)   Advance diet Up with therapy Discharge home with home health Touch Down Weight Bearing (TDWB)  VTE prophylaxis: intermittent pneumatic compression boots, ASA Will add Oxycontin to effort to switch to just PO pain meds Will have placed into CAM walker Possible d/c tomorrow, depending upon progress with PT and appropriate D/C plan  Shanon Brow A. Grandville Silos, [place],   [address] Office: [phone] Mobile:  [phone]  12/18/2015, 12:44 PM

## 2015-12-19 ENCOUNTER — Inpatient Hospital Stay (HOSPITAL_COMMUNITY)

## 2015-12-19 ENCOUNTER — Inpatient Hospital Stay (HOSPITAL_COMMUNITY)
Admit: 2015-12-19 | Discharge: 2015-12-19 | Disposition: A | Discharge status: Home / Self Care | Attending: Internal Medicine | Admitting: Internal Medicine

## 2015-12-19 ENCOUNTER — Other Ambulatory Visit: Payer: Self-pay

## 2015-12-19 DIAGNOSIS — R079 Chest pain, unspecified: Secondary | ICD-10-CM

## 2015-12-19 DIAGNOSIS — I208 Other forms of angina pectoris: Secondary | ICD-10-CM

## 2015-12-19 LAB — LIPID PANEL
Cholesterol: 205 mg/dL — ABNORMAL HIGH (ref 0–200)
HDL: 54 mg/dL (ref >40)
LDL CALC: 122 mg/dL — AB (ref 0–99)
Total CHOL/HDL Ratio: 3.8 RATIO
Triglycerides: 144 mg/dL (ref <150)
VLDL: 29 mg/dL (ref 0–40)

## 2015-12-19 LAB — ECHOCARDIOGRAM COMPLETE
HEIGHTINCHES: 68 in
WEIGHTICAEL: 3776 [oz_av]

## 2015-12-19 LAB — BASIC METABOLIC PANEL
ANION GAP: 8 (ref 5–15)
BUN: 7 mg/dL (ref 6–20)
CALCIUM: 8.4 mg/dL — AB (ref 8.9–10.3)
CO2: 29 mmol/L (ref 22–32)
CREATININE: 0.45 mg/dL (ref 0.44–1.00)
Chloride: 101 mmol/L (ref 101–111)
GFR calc Af Amer: >60 mL/min (ref >60)
GLUCOSE: 132 mg/dL — AB (ref 65–99)
Potassium: 3 mmol/L — ABNORMAL LOW (ref 3.5–5.1)
Sodium: 138 mmol/L (ref 135–145)

## 2015-12-19 LAB — MAGNESIUM: Magnesium: 2 mg/dL (ref 1.7–2.4)

## 2015-12-19 LAB — PROTIME-INR
INR: 0.96
PROTHROMBIN TIME: 12.8 s (ref 11.4–15.2)

## 2015-12-19 LAB — CBC
HEMATOCRIT: 36.3 % (ref 36.0–46.0)
Hemoglobin: 11.7 g/dL — ABNORMAL LOW (ref 12.0–15.0)
MCH: 28.2 pg (ref 26.0–34.0)
MCHC: 32.2 g/dL (ref 30.0–36.0)
MCV: 87.5 fL (ref 78.0–100.0)
PLATELETS: 229 10*3/uL (ref 150–400)
RBC: 4.15 MIL/uL (ref 3.87–5.11)
RDW: 13.8 % (ref 11.5–15.5)
WBC: 5.8 10*3/uL (ref 4.0–10.5)

## 2015-12-19 LAB — TROPONIN I

## 2015-12-19 LAB — HEPARIN LEVEL (UNFRACTIONATED): Heparin Unfractionated: <0.1 IU/mL — ABNORMAL LOW (ref 0.30–0.70)

## 2015-12-19 LAB — APTT: APTT: 26 s (ref 24–36)

## 2015-12-19 LAB — D-DIMER, QUANTITATIVE (NOT AT ARMC): D DIMER QUANT: 1.59 ug{FEU}/mL — AB (ref 0.00–0.50)

## 2015-12-19 MED ORDER — HEPARIN (PORCINE) IN NACL 100-0.45 UNIT/ML-% IJ SOLN
1500.0000 [IU]/h | INTRAMUSCULAR | Status: DC
  Filled 2015-12-19: qty 250

## 2015-12-19 MED ORDER — NITROGLYCERIN 0.4 MG SL SUBL
0.4000 mg | SUBLINGUAL_TABLET | SUBLINGUAL | Status: DC | PRN
  Administered 2015-12-19 (×2): 0.4 mg via SUBLINGUAL
  Filled 2015-12-19 (×2): qty 1

## 2015-12-19 MED ORDER — HEPARIN BOLUS VIA INFUSION
2500.0000 [IU] | INTRAVENOUS | Status: AC
  Administered 2015-12-19: 2500 [IU] via INTRAVENOUS
  Filled 2015-12-19: qty 2500

## 2015-12-19 MED ORDER — KETOROLAC TROMETHAMINE 15 MG/ML IJ SOLN
15.0000 mg | Freq: Four times a day (QID) | INTRAMUSCULAR | Status: DC | PRN
  Administered 2015-12-19 – 2015-12-20 (×4): 15 mg via INTRAVENOUS
  Filled 2015-12-19 (×4): qty 1

## 2015-12-19 MED ORDER — HYDRALAZINE HCL 20 MG/ML IJ SOLN
5.0000 mg | INTRAMUSCULAR | Status: DC | PRN
  Administered 2015-12-19: 5 mg via INTRAVENOUS
  Filled 2015-12-19: qty 1

## 2015-12-19 MED ORDER — ASPIRIN 325 MG PO TABS
325.0000 mg | ORAL_TABLET | Freq: Every day | ORAL | Status: DC
  Administered 2015-12-20: 325 mg via ORAL
  Filled 2015-12-19: qty 1

## 2015-12-19 MED ORDER — HEPARIN SODIUM (PORCINE) 5000 UNIT/ML IJ SOLN
5000.0000 [IU] | Freq: Three times a day (TID) | INTRAMUSCULAR | Status: DC
  Administered 2015-12-19 – 2015-12-20 (×2): 5000 [IU] via SUBCUTANEOUS
  Filled 2015-12-19 (×2): qty 1

## 2015-12-19 MED ORDER — POTASSIUM CHLORIDE CRYS ER 20 MEQ PO TBCR
40.0000 meq | EXTENDED_RELEASE_TABLET | Freq: Two times a day (BID) | ORAL | Status: AC
  Administered 2015-12-19 (×2): 40 meq via ORAL
  Filled 2015-12-19 (×2): qty 2

## 2015-12-19 NOTE — Progress Notes (Signed)
  Echocardiogram 2D Echocardiogram has been performed.  Tresa Res 12/19/2015, 2:36 PM

## 2015-12-19 NOTE — Significant Event (Addendum)
Rapid Response Event Note  Overview:      Initial Focused Assessment:   Interventions:  Plan of Care (if not transferred):Plan for Nuc Med scan as D Dimer was positive. Morphine 4 mg IVP given per order. Observation. Event Summary: RRT called to room by bedside RN, pt c/o chest pain. Bedside RN stated pt was up to [facility] and when stood up fell back over bed with c/o sharp chest pain. VSS 142/79 (95) HR NSR 88, RR 7-8, sats 97% RA. EKG done WNL. Dr Karleen Hampshire paged. Orders received. Okay to go to CT without RN.    at      at          [place], [place]

## 2015-12-19 NOTE — Progress Notes (Signed)
  PATIENT ID: Sherri [name]  MRN: [id number]  DOB/AGE:  59-10-1955 / 60 y.o.  2 Days Post-Op Procedure(s) (LRB): OPEN REDUCTION INTERNAL FIXATION (ORIF) ANKLE FRACTURE (Left)  Subjective: Overnight events noted, c/o left sided CP On IV heparin for concern for VTE Transferred to tele, undergoing medical w/u for chest pain Tol PO diet, meds, foot elevated, feels better in CAM walker  Objective: Vital signs in last 24 hours: Temp:  [98.4 F (36.9 C)-99 F (37.2 C)] 98.4 F (36.9 C) (12/12 0544) Pulse Rate:  [90-96] 90 (12/12 0544) Resp:  [18-20] 18 (12/12 0544) BP: (158-173)/(13-92) 163/77 (12/12 0700) SpO2:  [96 %-98 %] 96 % (12/12 0544)  Intake/Output from previous day: 12/11 0701 - 12/12 0700 In: 720 [P.O.:720] Out: 2475 [Urine:2475] Intake/Output this shift: No intake/output data recorded.   Recent Labs  12/16/15 2243 12/19/15 0434  HGB 12.0 11.7*    Recent Labs  12/16/15 2243 12/19/15 0434  WBC 10.2 5.8  RBC 4.27 4.15  HCT 36.9 36.3  PLT 269 229    Recent Labs  12/16/15 2243 12/19/15 0434  NA 141 138  K 3.4* 3.0*  CL 106 101  CO2 28 29  BUN 20 7  CREATININE 0.70 0.45  GLUCOSE 120* 132*  CALCIUM 9.1 8.4*    Recent Labs  12/19/15 0805  INR 0.96    Physical Exam: ACE removed from splint, ankle and distal leg swollen,  But not tense.  Can flex/ext toes actively without prohibitive pain.  Toes warm, CR brisk, DP palp Calf soft, supple  Assessment/Plan: 2 Days Post-Op Procedure(s) (LRB): OPEN REDUCTION INTERNAL FIXATION (ORIF) ANKLE FRACTURE (Left)   Resume PT when medically cleared to do so by hospitalists LE venous doppler negative for DVT, awaiting report of V/Q scan (cannot do contrasted CT due to allergy) Appreciate internal medicine caring for her chest pain  D/C to family home with HHPT once medically stable and clears PT Home walker and 3in1 ordered.  Rayvon Char Grandville Silos, [place], [place]  [address] Office: [phone] Mobile: [phone]  12/19/2015, 12:07 PM

## 2015-12-19 NOTE — Progress Notes (Signed)
VASCULAR LAB PRELIMINARY  PRELIMINARY  PRELIMINARY  PRELIMINARY  Bilateral lower extremity venous duplex completed.    Preliminary report:  Bilateral:  No evidence of DVT, superficial thrombosis, or Baker's Cyst.   Said Rueb, RVS 12/19/2015, 9:17 AM

## 2015-12-19 NOTE — Progress Notes (Signed)
PROGRESS NOTE    Sherri [name]  [id number] DOB: 1956/01/05 DOA: 12/16/2015 PCP: Harrison Mons, PA-C  Brief Narrative:  60 year old lady with past medical history of hypertension, depression, anxiety, who presents with left ankle fracture. She underwent left ankle fracture surgery on 12/17/15. Currently on ASA and SCD for DVT ppx. Patient developed chest pain in the midnight. Her chest pain is located in the left side of her chest, constant, radiating to left arm, sharp, 8 out of 10 in severity. It is pleuritic, it is aggravated by deep breath and coughing. It is associated with shortness of breath, but no cough, fever or chills. She also has left calf tenderness. Medical service was consulted for recommendations and evaluation for PE.   Assessment & Plan:   Principal Problem:   Closed left ankle fracture Active Problems:   HSV-2 (herpes simplex virus 2) infection   Essential hypertension   Chest pain   Closed left ankle fracture: Venous duplex negative for DVT.  Further management as per primary service, orthopedics.  Therapy and pain meds as needed.   Chest pain: differentials include pleurisy vs pulmonary embolism vs ACS vs musculoskeletal pain VS GERD.  PE ruled out. Heparin gtt discontinued. Sq heparin ordered.  ACS ruled out with , troponins negative.  Echocardiogram pending.  EKG no ischemic changes.  CXR negative for pneumonia, but could be pleurisy.  Skelexin .  PPI added.  On aspirin 325 mg daily.    Hypertension: suboptimal. Prn hydralazine.   HSV 2 infection:  On valtrex.     DVT prophylaxis: heparin sq.  Code Status: full code.  Family Communication: none at bedside.  Disposition Plan: Home , as per primary.       Procedures: V/Q Scan.   Echocardiogram.    Antimicrobials: none.    Subjective: Reports chest pain on deep inspiration and tenderness to palpation.   Objective: Vitals:   12/19/15 0327 12/19/15 0544 12/19/15 0700 12/19/15  1407  BP: (!) 158/13 (!) 170/84 (!) 163/77 (!) 146/93  Pulse: 96 90  95  Resp: 20 18  16   Temp:  98.4 F (36.9 C)  98.8 F (37.1 C)  TempSrc:  Oral  Oral  SpO2: 96% 96%  98%  Weight:      Height:        Intake/Output Summary (Last 24 hours) at 12/19/15 1423 Last data filed at 12/19/15 0127  Gross per 24 hour  Intake              480 ml  Output             2175 ml  Net            -1695 ml   Filed Weights   12/16/15 2132  Weight: 107 kg (236 lb)    Examination:  General exam: Appears anxious.  Respiratory system: Clear to auscultation. Respiratory effort normal. Cardiovascular system: S1 & S2 heard, RRR. No JVD, murmurs, rubs, gallops or clicks. No pedal edema. Gastrointestinal system: Abdomen is nondistended, soft and nontender. No organomegaly or masses felt. Normal bowel sounds heard. Central nervous system: Alert and oriented. No focal neurological deficits. Extremities: left lower extremity tender, Skin: No rashes, lesions or ulcers     Data Reviewed: I have personally reviewed following labs and imaging studies  CBC:  Recent Labs Lab 12/16/15 2243 12/19/15 0434  WBC 10.2 5.8  NEUTROABS 7.7  --   HGB 12.0 11.7*  HCT 36.9 36.3  MCV 86.4 87.5  PLT  269 [id number]   Basic Metabolic Panel:  Recent Labs Lab 12/16/15 2243 12/19/15 0434 12/19/15 0806  NA 141 138  --   K 3.4* 3.0*  --   CL 106 101  --   CO2 28 29  --   GLUCOSE 120* 132*  --   BUN 20 7  --   CREATININE 0.70 0.45  --   CALCIUM 9.1 8.4*  --   MG  --   --  2.0   GFR: Estimated Creatinine Clearance: 96.9 mL/min (by C-G formula based on SCr of 0.45 mg/dL). Liver Function Tests: No results for input(s): AST, ALT, ALKPHOS, BILITOT, PROT, ALBUMIN in the last 168 hours. No results for input(s): LIPASE, AMYLASE in the last 168 hours. No results for input(s): AMMONIA in the last 168 hours. Coagulation Profile:  Recent Labs Lab 12/19/15 0805  INR 0.96   Cardiac Enzymes:  Recent Labs Lab  12/19/15 0434 12/19/15 0805  TROPONINI <0.03 <0.03   BNP (last 3 results) No results for input(s): PROBNP in the last 8760 hours. HbA1C: No results for input(s): HGBA1C in the last 72 hours. CBG: No results for input(s): GLUCAP in the last 168 hours. Lipid Profile:  Recent Labs  12/19/15 0434  CHOL 205*  HDL 54  LDLCALC 122*  TRIG 144  CHOLHDL 3.8   Thyroid Function Tests: No results for input(s): TSH, T4TOTAL, FREET4, T3FREE, THYROIDAB in the last 72 hours. Anemia Panel: No results for input(s): VITAMINB12, FOLATE, FERRITIN, TIBC, IRON, RETICCTPCT in the last 72 hours. Sepsis Labs: No results for input(s): PROCALCITON, LATICACIDVEN in the last 168 hours.  Recent Results (from the past 240 hour(s))  Surgical PCR screen     Status: None   Collection Time: 12/17/15  2:40 AM  Result Value Ref Range Status   MRSA, PCR NEGATIVE NEGATIVE Final   Staphylococcus aureus NEGATIVE NEGATIVE Final    Comment:        The Xpert SA Assay (FDA approved for NASAL specimens in patients over 12 years of age), is one component of a comprehensive surveillance program.  Test performance has been validated by [facility] for patients greater than or equal to 76 year old. It is not intended to diagnose infection nor to guide or monitor treatment.          Radiology Studies: Nm Pulmonary Perf And Vent  Result Date: 12/19/2015 CLINICAL DATA:  Left-sided chest pain and shortness of Breath EXAM: NUCLEAR MEDICINE VENTILATION - PERFUSION LUNG SCAN TECHNIQUE: Ventilation images were obtained in multiple projections using inhaled aerosol Tc-67m DTPA. Perfusion images were obtained in multiple projections after intravenous injection of Tc-29m MAA. RADIOPHARMACEUTICALS:  32 mCi Technetium-54m DTPA aerosol inhalation and 4.4 mCi Technetium-18m MAA IV COMPARISON:  Chest x-ray from earlier in the same day FINDINGS: Ventilation: Ventilation images demonstrate adequate uptake throughout both lungs  with some mild trapping centrally in the bronchial tree. Perfusion: Perfusion images demonstrate adequate uptake bilaterally. IMPRESSION: No ventilation-perfusion mismatch to suggest pulmonary embolism is seen. Electronically Signed   By: Inez Catalina M.D.   On: 12/19/2015 12:42   Dg Chest Port 1 View  Result Date: 12/19/2015 CLINICAL DATA:  60 y/o F; sudden onset of chest pain and shortness of breath. EXAM: PORTABLE CHEST 1 VIEW COMPARISON:  None. FINDINGS: Stable cardiac silhouette given projection and technique. Left mid lung zone linear opacity, likely atelectasis. No focal consolidation. No effusion or pneumothorax. Bones are unremarkable. IMPRESSION: Left mid lung zone minor atelectasis.  Otherwise clear lungs. Electronically  Signed   By: Kristine Garbe M.D.   On: 12/19/2015 05:48        Scheduled Meds: . acetaminophen  650 mg Oral Q6H  . docusate sodium  100 mg Oral BID  . heparin subcutaneous  5,000 Units Subcutaneous Q8H  . hydrochlorothiazide  12.5 mg Oral Daily  . multivitamin with minerals  1 tablet Oral Daily  . oxyCODONE  20 mg Oral Q12H  . potassium chloride  40 mEq Oral BID  . valACYclovir  500 mg Oral BID   Continuous Infusions:   LOS: 1 day    Time spent: 30 minutes    Srikar Chiang, MD [facility] Pager [phone] If 7PM-7AM, please contact night-coverage [email or web address] Password [email or web address] 12/19/2015, 2:23 PM

## 2015-12-19 NOTE — Consult Note (Signed)
Medical Consultation   Sherri [name]  [id number]  DOB: 06/05/1955  DOA: 12/16/2015  PCP: Harrison Mons, PA-C  Outpatient Specialists: none   Requesting physician: Manson Passey, PA [place]  Reason for consultation: Chest pain  History of Present Illness: 60 year old lady with past medical history of hypertension, depression, anxiety, who presents with left ankle fracture. She underwent left ankle fracture surgery on 12/17/15. Currently on ASA and SCD for DVT ppx. Patient developed chest pain in the midnight. Her chest pain is located in the left side of her chest, constant, radiating to left arm, sharp, 8 out of 10 in severity. It is pleuritic, it is aggravated by deep breath and coughing. It is associated with shortness of breath, but no cough, fever or chills. She also has left calf tenderness. Patient denies nausea, vomiting, abdominal pain or diarrhea.  Patient states that she had one episode of chest pain on last Monday, which lasted for about 4 hours, then resolved after she took her blood pressure medications. Stat d-dimer positive 1.59.    Review of Systems:  ROS  ROS: General: no fevers, chills, no changes in body weight, no changes in appetite Skin: no rash HEENT: no blurry vision, hearing changes or sore throat Pulm: has dyspnea, no coughing, wheezing CV: has chest pain and shortness of breath Abd: no nausea/vomiting, abdominal pain, diarrhea/constipation GU: no dysuria, hematuria, polyuria Ext: s/p of surgery for left ankle fracture. Neuro: no weakness, numbness, or tingling   Past Medical History: Past Medical History:  Diagnosis Date  . Anxiety   . Breast pain, right   . Depression   . Hypertension    not taken atenolol or lisinopril in >56mos, no money  . Migraines   . Ruptured disk     Past Surgical History: Past Surgical History:  Procedure Laterality Date  . BREAST SURGERY     lumpectomy in 20s for benign mass with implant placement  (subglandular)  . CARPAL TUNNEL RELEASE     right wrist  . CARPAL TUNNEL RELEASE Right 09/02/2014   Procedure: RIGHT CARPAL TUNNEL RELEASE;  Surgeon: Melrose Nakayama, MD;  Location: [facility];  Service: Orthopedics;  Laterality: Right;  . CERVICAL CONE BIOPSY    . KNEE ARTHROSCOPY  07/1996  . ORIF ANKLE FRACTURE Left 12/17/2015   Procedure: OPEN REDUCTION INTERNAL FIXATION (ORIF) ANKLE FRACTURE;  Surgeon: Milly Jakob, MD;  Location: [facility];  Service: Orthopedics;  Laterality: Left;  . SPINE SURGERY  72011   L2-L5 stabilization     Allergies:   Allergies  Allergen Reactions  . Effexor [Venlafaxine Hydrochloride] Shortness Of Breath, Rash and Other (See Comments)    HALLUCINATIONS ALSO  . Iodine Anaphylaxis  . Shellfish Allergy Anaphylaxis  . Flexeril [Cyclobenzaprine Hcl] Other (See Comments)    HALLUCINATIONS AND "OUT OF CONTROL"  . Hydromorphone Other (See Comments)    HALLUCINATIONS  . Nubain [Nalbuphine Hcl] Other (See Comments)    HALLUCINATIONS  . Codeine Swelling and Rash  . Erythromycin Rash     Social History:  reports that she quit smoking about 39 years ago. Her smoking use included Cigarettes. She quit after 1.00 year of use. She has never used smokeless tobacco. She reports that she does not drink alcohol or use drugs.   Family History: Family History  Problem Relation Age of Onset  . Diabetes Mother   . Glaucoma Mother   . Cancer Mother  breast, ovarian  . Heart disease Mother   . Gout Father   . Cancer Sister     ovarian, uterine cancer   Physical Exam: Vitals:   12/18/15 2131 12/19/15 0327 12/19/15 0544 12/19/15 0700  BP: (!) 173/92 (!) 158/13 (!) 170/84 (!) 163/77  Pulse: 95 96 90   Resp: 18 20 18    Temp: 98.6 F (37 C)  98.4 F (36.9 C)   TempSrc: Oral  Oral   SpO2: 98% 96% 96%   Weight:      Height:         General: Not in acute distress HEENT: PERRL, EOMI, no scleral icterus, No JVD or bruit Cardiac: S1/S2, RRR,  No murmurs, gallops or rubs Pulm:  Clear to auscultation bilaterally. No rales, wheezing, rhonchi or rubs. Abd: Soft, nondistended, nontender, no rebound pain, no organomegaly, BS present Ext: No edema. 2+DP/PT pulse bilaterally Musculoskeletal: s/p of left ankle surgery with splint. Pt has reproducible chest wall tenderness Skin: No rashes.  Neuro: Alert and oriented X3, cranial nerves II-XII grossly intact, muscle strength 5/5 in all extremeties. Psych: Patient is not psychotic, no suicidal or hemocidal ideation.  Data reviewed:  I have personally reviewed following labs and imaging studies Labs:  CBC:  Recent Labs Lab 12/16/15 2243 12/19/15 0434  WBC 10.2 5.8  NEUTROABS 7.7  --   HGB 12.0 11.7*  HCT 36.9 36.3  MCV 86.4 87.5  PLT 269 [id number]    Basic Metabolic Panel:  Recent Labs Lab 12/16/15 2243 12/19/15 0434  NA 141 138  K 3.4* 3.0*  CL 106 101  CO2 28 29  GLUCOSE 120* 132*  BUN 20 7  CREATININE 0.70 0.45  CALCIUM 9.1 8.4*   GFR Estimated Creatinine Clearance: 96.9 mL/min (by C-G formula based on SCr of 0.45 mg/dL). Liver Function Tests: No results for input(s): AST, ALT, ALKPHOS, BILITOT, PROT, ALBUMIN in the last 168 hours. No results for input(s): LIPASE, AMYLASE in the last 168 hours. No results for input(s): AMMONIA in the last 168 hours. Coagulation profile No results for input(s): INR, PROTIME in the last 168 hours.  Cardiac Enzymes:  Recent Labs Lab 12/19/15 0434  TROPONINI <0.03   BNP: Invalid input(s): POCBNP CBG: No results for input(s): GLUCAP in the last 168 hours. D-Dimer  Recent Labs  12/19/15 0434  DDIMER 1.59*   Hgb A1c No results for input(s): HGBA1C in the last 72 hours. Lipid Profile No results for input(s): CHOL, HDL, LDLCALC, TRIG, CHOLHDL, LDLDIRECT in the last 72 hours. Thyroid function studies No results for input(s): TSH, T4TOTAL, T3FREE, THYROIDAB in the last 72 hours.  Invalid input(s): FREET3 Anemia work up No  results for input(s): VITAMINB12, FOLATE, FERRITIN, TIBC, IRON, RETICCTPCT in the last 72 hours. Urinalysis    Component Value Date/Time   COLORURINE YELLOW 06/30/2009 1245   APPEARANCEUR CLEAR 06/30/2009 1245   LABSPEC 1.015 06/30/2009 1245   PHURINE 6.5 06/30/2009 1245   GLUCOSEU NEGATIVE 06/30/2009 1245   HGBUR NEGATIVE 06/30/2009 1245   BILIRUBINUR NEGATIVE 06/30/2009 1245   KETONESUR NEGATIVE 06/30/2009 1245   PROTEINUR NEGATIVE 06/30/2009 1245   UROBILINOGEN 0.2 06/30/2009 1245   NITRITE NEGATIVE 06/30/2009 1245   LEUKOCYTESUR  06/30/2009 1245    NEGATIVE MICROSCOPIC NOT DONE ON URINES WITH NEGATIVE PROTEIN, BLOOD, LEUKOCYTES, NITRITE, OR GLUCOSE <1000 mg/dL.     Microbiology Recent Results (from the past 240 hour(s))  Surgical PCR screen     Status: None   Collection Time: 12/17/15  2:40 AM  Result Value Ref Range Status   MRSA, PCR NEGATIVE NEGATIVE Final   Staphylococcus aureus NEGATIVE NEGATIVE Final    Comment:        The Xpert SA Assay (FDA approved for NASAL specimens in patients over 39 years of age), is one component of a comprehensive surveillance program.  Test performance has been validated by [facility] for patients greater than or equal to 9 year old. It is not intended to diagnose infection nor to guide or monitor treatment.        Inpatient Medications:   Scheduled Meds: . acetaminophen  650 mg Oral Q6H  . aspirin  325 mg Oral Daily  . docusate sodium  100 mg Oral BID  . hydrochlorothiazide  12.5 mg Oral Daily  . meloxicam  15 mg Oral Daily  .  morphine injection  4 mg Intravenous Once  . multivitamin with minerals  1 tablet Oral Daily  . oxyCODONE  20 mg Oral Q12H  . valACYclovir  500 mg Oral BID   Continuous Infusions:   Radiological Exams on Admission: Dg Ankle 2 Views Left  Result Date: 12/17/2015 CLINICAL DATA:  Surgery. FLUOROSCOPY TIME:  20 seconds. Images: 3 EXAM: LEFT ANKLE - 2 VIEW COMPARISON:  December 16, 2015  FINDINGS: A plate is now affixed to the fibula across the known fracture. Alignment of the ankle mortise is improved in the interval. IMPRESSION: Interval fibular fracture repair as above. Electronically Signed   By: Dorise Bullion III M.D   On: 12/17/2015 09:00   Dg Chest Port 1 View  Result Date: 12/19/2015 CLINICAL DATA:  60 y/o F; sudden onset of chest pain and shortness of breath. EXAM: PORTABLE CHEST 1 VIEW COMPARISON:  None. FINDINGS: Stable cardiac silhouette given projection and technique. Left mid lung zone linear opacity, likely atelectasis. No focal consolidation. No effusion or pneumothorax. Bones are unremarkable. IMPRESSION: Left mid lung zone minor atelectasis.  Otherwise clear lungs. Electronically Signed   By: Kristine Garbe M.D.   On: 12/19/2015 05:48   Dg C-arm 1-60 Min-no Report  Result Date: 12/17/2015 There is no Radiologist interpretation  for this exam.   Impression/Recommendations  Chest pain: Etiology is not clear. Since patient just had surgery, now has pleuritic chest pain elevated d-dimer, PE is most important differential diagnosis. Other differential diagnosis includes ACS and musculoskeletal pain given reproducible chest wall tenderness. Pt is allergic to iodine with anaphylaxis reaction-->cannot to CTA. I discussed with primary ortho team, PA [place], who is OK to start IV heparin for possible PE.  - will transfer to tele bed - start IV heparin now - f/u V/Q scan and LE venous doppler - cycle CE q6 x3 and repeat her EKG in the am  - prn Nitroglycerin, Morphine, and aspirin - Risk factor stratification: will check FLP and A1C  - 2d echo -check CBC, BMP, CXR  Closed left ankle fracture: s/p of surgery on 12/17/15.  -managed by primary ortho team -pain control -with splint  HSV-2 (herpes simplex virus 2) infection -continue valtrex  Essential hypertension: bp is elevated at 173/92 -continue HCTZ -iv hydralazine when necessary   Thank  you for this consultation.  Our [facility] hospitalist team will follow the patient with you.   Time Spent:  35 min  Ivor Costa M.D. [facility] 12/19/2015, 7:26 AM

## 2015-12-19 NOTE — Progress Notes (Signed)
PT Cancellation Note  Patient Details Name: Sherri [name] MRN: [id number] DOB: 1955-04-14   Cancelled Treatment:    Reason Eval/Treat Not Completed: Other (comment); pt began Heparin this am, will need to wait 24hrs or until levels within acceptable  range before PT; spoke with ortho--notes state to wait until cleared by medicine; VQ scan and dopplers negative; will see later today or in am as schedule and medical clearance allow   [facility] 12/19/2015, 2:16 PM

## 2015-12-19 NOTE — Progress Notes (Addendum)
ANTICOAGULATION CONSULT NOTE - Initial Consult  Pharmacy Consult for Heparin Indication: pulmonary embolus  Allergies  Allergen Reactions  . Effexor [Venlafaxine Hydrochloride] Shortness Of Breath, Rash and Other (See Comments)    HALLUCINATIONS ALSO  . Iodine Anaphylaxis  . Shellfish Allergy Anaphylaxis  . Flexeril [Cyclobenzaprine Hcl] Other (See Comments)    HALLUCINATIONS AND "OUT OF CONTROL"  . Hydromorphone Other (See Comments)    HALLUCINATIONS  . Nubain [Nalbuphine Hcl] Other (See Comments)    HALLUCINATIONS  . Codeine Swelling and Rash  . Erythromycin Rash    Patient Measurements: Height: 5\' 8"  (172.7 cm) Weight: 236 lb (107 kg) IBW/kg (Calculated) : 63.9 Heparin Dosing Weight: 88 kg  Vital Signs: Temp: 98.4 F (36.9 C) (12/12 0544) Temp Source: Oral (12/12 0544) BP: 163/77 (12/12 0700) Pulse Rate: 90 (12/12 0544)  Labs:  Recent Labs  12/16/15 2243 12/19/15 0434  HGB 12.0 11.7*  HCT 36.9 36.3  PLT 269 229  CREATININE 0.70 0.45  TROPONINI  --  <0.03    Estimated Creatinine Clearance: 96.9 mL/min (by C-G formula based on SCr of 0.45 mg/dL).   Medical History: Past Medical History:  Diagnosis Date  . Anxiety   . Breast pain, right   . Depression   . Hypertension    not taken atenolol or lisinopril in >39mos, no money  . Migraines   . Ruptured disk     Medications:  Scheduled:  . acetaminophen  650 mg Oral Q6H  . aspirin  325 mg Oral Daily  . docusate sodium  100 mg Oral BID  . hydrochlorothiazide  12.5 mg Oral Daily  . meloxicam  15 mg Oral Daily  .  morphine injection  4 mg Intravenous Once  . multivitamin with minerals  1 tablet Oral Daily  . oxyCODONE  20 mg Oral Q12H  . valACYclovir  500 mg Oral BID   Infusions:    Assessment: 65 yoF admitted after falling for a left ankle fracture with subluxation, s/p ORIF on 12/10.  On 12/12 AM, she complains of chest pain.  Pharmacy is consulted to dose Heparin for r/o PE. Current VTE  prophylaxis:  SCDs and ASA 325mg  daily - last dose on 12/11 10:37   Today, 12/19/2015: Baseline coags pending D-dimer: 1.59 CBC: Hgb decreased postop but stabilizing (14.7 > 12 > 11.7), Plt remain WNL. SCr 0.45, CrCl ~ 97 ml/min  Goal of Therapy:  Heparin level 0.3-0.7 units/ml Monitor platelets by anticoagulation protocol: Yes   Plan:   Baseline PTT, PT/INR  Give heparin 2500 units bolus IV x 1  Start heparin IV infusion at 1500 units/hr  Heparin level 6 hours after starting  Daily heparin level and CBC  Follow up further PE testing.   Gretta Arab PharmD, BCPS Pager [phone] 12/19/2015,7:12 AM

## 2015-12-19 NOTE — Progress Notes (Signed)
Pt. C/o left chest pain VSS stable 96, 20, 158/73 and O2 sats 97 RA. Dr Myles Rosenthal notified stated that he would notify medical. Morphine 2mg  given IV as ordered will continue to monitor.

## 2015-12-20 ENCOUNTER — Inpatient Hospital Stay (HOSPITAL_COMMUNITY)

## 2015-12-20 DIAGNOSIS — E784 Other hyperlipidemia: Secondary | ICD-10-CM

## 2015-12-20 DIAGNOSIS — S82892A Other fracture of left lower leg, initial encounter for closed fracture: Secondary | ICD-10-CM

## 2015-12-20 DIAGNOSIS — B009 Herpesviral infection, unspecified: Secondary | ICD-10-CM

## 2015-12-20 DIAGNOSIS — R079 Chest pain, unspecified: Secondary | ICD-10-CM

## 2015-12-20 DIAGNOSIS — I10 Essential (primary) hypertension: Secondary | ICD-10-CM

## 2015-12-20 LAB — CBC
HCT: 36.6 % (ref 36.0–46.0)
Hemoglobin: 11.7 g/dL — ABNORMAL LOW (ref 12.0–15.0)
MCH: 27.9 pg (ref 26.0–34.0)
MCHC: 32 g/dL (ref 30.0–36.0)
MCV: 87.4 fL (ref 78.0–100.0)
PLATELETS: 230 10*3/uL (ref 150–400)
RBC: 4.19 MIL/uL (ref 3.87–5.11)
RDW: 13.8 % (ref 11.5–15.5)
WBC: 4.4 10*3/uL (ref 4.0–10.5)

## 2015-12-20 LAB — BASIC METABOLIC PANEL
ANION GAP: 8 (ref 5–15)
BUN: 13 mg/dL (ref 6–20)
CO2: 28 mmol/L (ref 22–32)
Calcium: 8.9 mg/dL (ref 8.9–10.3)
Chloride: 102 mmol/L (ref 101–111)
Creatinine, Ser: 0.57 mg/dL (ref 0.44–1.00)
Glucose, Bld: 127 mg/dL — ABNORMAL HIGH (ref 65–99)
POTASSIUM: 3.6 mmol/L (ref 3.5–5.1)
SODIUM: 138 mmol/L (ref 135–145)

## 2015-12-20 LAB — HEMOGLOBIN A1C
HEMOGLOBIN A1C: 5.9 % — AB (ref 4.8–5.6)
MEAN PLASMA GLUCOSE: 123 mg/dL

## 2015-12-20 MED ORDER — OXYCODONE HCL ER 20 MG PO T12A: 20.0000 mg | EXTENDED_RELEASE_TABLET | Freq: Two times a day (BID) | ORAL | 0 refills | Status: AC

## 2015-12-20 MED ORDER — OXYCODONE HCL 10 MG PO TABS: 10.0000 mg | ORAL_TABLET | ORAL | 0 refills | Status: DC | PRN

## 2015-12-20 MED ORDER — ENOXAPARIN SODIUM 40 MG/0.4ML ~~LOC~~ SOLN: 40.0000 mg | SUBCUTANEOUS | 0 refills | Status: DC

## 2015-12-20 MED ORDER — SENNOSIDES-DOCUSATE SODIUM 8.6-50 MG PO TABS
1.0000 | ORAL_TABLET | Freq: Two times a day (BID) | ORAL | Status: DC
  Administered 2015-12-20: 1 via ORAL
  Filled 2015-12-20: qty 1

## 2015-12-20 MED ORDER — POLYETHYLENE GLYCOL 3350 17 G PO PACK
17.0000 g | PACK | Freq: Every day | ORAL | Status: DC
  Administered 2015-12-20: 17 g via ORAL
  Filled 2015-12-20: qty 1

## 2015-12-20 MED ORDER — ENOXAPARIN SODIUM 40 MG/0.4ML ~~LOC~~ SOLN
40.0000 mg | SUBCUTANEOUS | Status: DC
  Administered 2015-12-20: 40 mg via SUBCUTANEOUS
  Filled 2015-12-20: qty 0.4

## 2015-12-20 MED ORDER — ATORVASTATIN CALCIUM 20 MG PO TABS: 20.0000 mg | ORAL_TABLET | Freq: Every day | ORAL | 0 refills | Status: DC

## 2015-12-20 MED ORDER — DOCUSATE SODIUM 100 MG PO CAPS: 100.0000 mg | ORAL_CAPSULE | Freq: Two times a day (BID) | ORAL | 0 refills | Status: DC

## 2015-12-20 MED ORDER — PROMETHAZINE HCL 25 MG PO TABS: 25.0000 mg | ORAL_TABLET | Freq: Four times a day (QID) | ORAL | 1 refills | Status: DC | PRN

## 2015-12-20 NOTE — Progress Notes (Signed)
PROGRESS NOTE    Sherri [name]  [id number] DOB: [birth year] DOA: 12/16/2015 PCP: Harrison Mons, PA-C  Brief Narrative:  60 year old lady with past medical history of hypertension, depression, anxiety, who presents with left ankle fracture. She underwent left ankle fracture surgery on 12/17/15. Currently on ASA and SCD for DVT ppx. Patient developed chest pain in the midnight. Her chest pain is located in the left side of her chest, constant, radiating to left arm, sharp, 8 out of 10 in severity. It is pleuritic, it is aggravated by deep breath and coughing. It is associated with shortness of breath, but no cough, fever or chills. She also has left calf tenderness. Medical service was consulted for recommendations and evaluation for PE.   Assessment & Plan:   Principal Problem:   Closed left ankle fracture Active Problems:   HSV-2 (herpes simplex virus 2) infection   Essential hypertension   Chest pain   Closed left ankle fracture: Venous duplex negative for DVT.  Further management as per primary service, orthopedics.  Therapy and pain meds as needed.   Chest pain: differentials include pleurisy vs pulmonary embolism vs ACS vs musculoskeletal pain VS GERD.  PE ruled out. Heparin gtt discontinued. Sq heparin ordered.  ACS ruled out with , troponins negative.  Echocardiogram no wall motion abnormalities, lvef wnl, grade I diastolic dysfunction EKG no ischemic changes.  CXR negative for pneumonia, but could be pleurisy.  Skelexin .  PPI added.  She does has chest wall tenderness on exam, pain likely musculoskeletal. Skin unremarkable. She does has hyperlipidemia, ldl 122, started her on lipitor 20mg  daily, she is referred to have outpatient stress test.    Hypertension: continue home meds hctz. Follow up with pmd for further bp meds adjustment.   HSV 2 infection: ( mouth sores and lesions on buttock) On valtrex.  Resolved. Follow up with pmd  Constipation: stool  softener    DVT prophylaxis: heparin sq.  Code Status: full code.  Family Communication: none at bedside.  Disposition Plan: Home , as per primary.       Procedures: V/Q Scan.   Echocardiogram.    Antimicrobials: none.    Subjective: Left sided chest pain seems improving, no cough, no sob, no dizziness   Objective: Vitals:   12/19/15 1407 12/19/15 2138 12/19/15 2140 12/20/15 0447  BP: (!) 146/93 (!) 162/82 93/73 (!) 157/73  Pulse: 95 91 90 87  Resp: 16 16  17   Temp: 98.8 F (37.1 C) 99 F (37.2 C)  98.5 F (36.9 C)  TempSrc: Oral Oral  Oral  SpO2: 98% 96%  96%  Weight:      Height:       No intake or output data in the 24 hours ending 12/20/15 0926 Filed Weights   12/16/15 2132  Weight: 107 kg (236 lb)    Examination:  General exam: Appears anxious. Chest wall tenderness, no visible skin lesions Respiratory system: Clear to auscultation. Respiratory effort normal. Cardiovascular system: S1 & S2 heard, RRR. No JVD, murmurs, rubs, gallops or clicks. No pedal edema. Gastrointestinal system: Abdomen is nondistended, soft and nontender. No organomegaly or masses felt. Normal bowel sounds heard. Central nervous system: Alert and oriented. No focal neurological deficits. Extremities: left lower extremity tender, Skin: No rashes, lesions or ulcers     Data Reviewed: I have personally reviewed following labs and imaging studies  CBC:  Recent Labs Lab 12/16/15 2243 12/19/15 0434 12/20/15 0512  WBC 10.2 5.8 4.4  NEUTROABS 7.7  --   --  HGB 12.0 11.7* 11.7*  HCT 36.9 36.3 36.6  MCV 86.4 87.5 87.4  PLT 269 229 [id number]   Basic Metabolic Panel:  Recent Labs Lab 12/16/15 2243 12/19/15 0434 12/19/15 0806 12/20/15 0512  NA 141 138  --  138  K 3.4* 3.0*  --  3.6  CL 106 101  --  102  CO2 28 29  --  28  GLUCOSE 120* 132*  --  127*  BUN 20 7  --  13  CREATININE 0.70 0.45  --  0.57  CALCIUM 9.1 8.4*  --  8.9  MG  --   --  2.0  --    GFR: Estimated  Creatinine Clearance: 96.9 mL/min (by C-G formula based on SCr of 0.57 mg/dL). Liver Function Tests: No results for input(s): AST, ALT, ALKPHOS, BILITOT, PROT, ALBUMIN in the last 168 hours. No results for input(s): LIPASE, AMYLASE in the last 168 hours. No results for input(s): AMMONIA in the last 168 hours. Coagulation Profile:  Recent Labs Lab 12/19/15 0805  INR 0.96   Cardiac Enzymes:  Recent Labs Lab 12/19/15 0434 12/19/15 0805 12/19/15 1601  TROPONINI <0.03 <0.03 <0.03   BNP (last 3 results) No results for input(s): PROBNP in the last 8760 hours. HbA1C:  Recent Labs  12/19/15 0434  HGBA1C 5.9*   CBG: No results for input(s): GLUCAP in the last 168 hours. Lipid Profile:  Recent Labs  12/19/15 0434  CHOL 205*  HDL 54  LDLCALC 122*  TRIG 144  CHOLHDL 3.8   Thyroid Function Tests: No results for input(s): TSH, T4TOTAL, FREET4, T3FREE, THYROIDAB in the last 72 hours. Anemia Panel: No results for input(s): VITAMINB12, FOLATE, FERRITIN, TIBC, IRON, RETICCTPCT in the last 72 hours. Sepsis Labs: No results for input(s): PROCALCITON, LATICACIDVEN in the last 168 hours.  Recent Results (from the past 240 hour(s))  Surgical PCR screen     Status: None   Collection Time: 12/17/15  2:40 AM  Result Value Ref Range Status   MRSA, PCR NEGATIVE NEGATIVE Final   Staphylococcus aureus NEGATIVE NEGATIVE Final    Comment:        The Xpert SA Assay (FDA approved for NASAL specimens in patients over 70 years of age), is one component of a comprehensive surveillance program.  Test performance has been validated by [facility] for patients greater than or equal to 58 year old. It is not intended to diagnose infection nor to guide or monitor treatment.          Radiology Studies: Dg Chest 2 View  Result Date: 12/20/2015 CLINICAL DATA:  Chest pain, palpitations for 2 days, former history of smoking EXAM: CHEST  2 VIEW COMPARISON:  Chest x-ray of 12/19/2015  FINDINGS: No active infiltrate or effusion is seen. Mediastinal and hilar contours are unremarkable. The heart is within normal limits in size. No bony abnormality is seen. IMPRESSION: No active cardiopulmonary disease. Electronically Signed   By: Ivar Drape M.D.   On: 12/20/2015 08:54   Nm Pulmonary Perf And Vent  Result Date: 12/19/2015 CLINICAL DATA:  Left-sided chest pain and shortness of Breath EXAM: NUCLEAR MEDICINE VENTILATION - PERFUSION LUNG SCAN TECHNIQUE: Ventilation images were obtained in multiple projections using inhaled aerosol Tc-91m DTPA. Perfusion images were obtained in multiple projections after intravenous injection of Tc-67m MAA. RADIOPHARMACEUTICALS:  32 mCi Technetium-83m DTPA aerosol inhalation and 4.4 mCi Technetium-49m MAA IV COMPARISON:  Chest x-ray from earlier in the same day FINDINGS: Ventilation: Ventilation images demonstrate adequate uptake throughout  both lungs with some mild trapping centrally in the bronchial tree. Perfusion: Perfusion images demonstrate adequate uptake bilaterally. IMPRESSION: No ventilation-perfusion mismatch to suggest pulmonary embolism is seen. Electronically Signed   By: Inez Catalina M.D.   On: 12/19/2015 12:42   Dg Chest Port 1 View  Result Date: 12/19/2015 CLINICAL DATA:  60 y/o F; sudden onset of chest pain and shortness of breath. EXAM: PORTABLE CHEST 1 VIEW COMPARISON:  None. FINDINGS: Stable cardiac silhouette given projection and technique. Left mid lung zone linear opacity, likely atelectasis. No focal consolidation. No effusion or pneumothorax. Bones are unremarkable. IMPRESSION: Left mid lung zone minor atelectasis.  Otherwise clear lungs. Electronically Signed   By: Kristine Garbe M.D.   On: 12/19/2015 05:48        Scheduled Meds: . acetaminophen  650 mg Oral Q6H  . aspirin  325 mg Oral Daily  . heparin subcutaneous  5,000 Units Subcutaneous Q8H  . hydrochlorothiazide  12.5 mg Oral Daily  . multivitamin with  minerals  1 tablet Oral Daily  . oxyCODONE  20 mg Oral Q12H  . polyethylene glycol  17 g Oral Daily  . senna-docusate  1 tablet Oral BID  . valACYclovir  500 mg Oral BID   Continuous Infusions:   LOS: 2 days    Time spent: 25 minutes    Ilyse Tremain, MD PhD [facility] Pager [phone] If 7PM-7AM, please contact night-coverage [email or web address] Password [email or web address] 12/20/2015, 9:26 AM

## 2015-12-20 NOTE — Progress Notes (Signed)
Lovenox administration teaching and demonstration given. Patient self administered Lovenox correctly. Questions answered.

## 2015-12-20 NOTE — Discharge Summary (Signed)
Physician Discharge Summary  Patient ID: Sherri [name] MRN: [id number] DOB/AGE: 1955-05-21 60 y.o.  Admit date: 12/16/2015 Discharge date: 12/20/2015  Admission Diagnoses:  Closed left ankle fracture  Discharge Diagnoses:  Principal Problem:   Closed left ankle fracture Active Problems:   HSV-2 (herpes simplex virus 2) infection   Essential hypertension   Chest pain   Past Medical History:  Diagnosis Date  . Anxiety   . Breast pain, right   . Depression   . Hypertension    not taken atenolol or lisinopril in >35mos, no money  . Migraines   . Ruptured disk     Surgeries: Procedure(s): OPEN REDUCTION INTERNAL FIXATION (ORIF) ANKLE FRACTURE on 12/16/2015 - 12/17/2015   Consultants (if any):   Discharged Condition: Improved  Hospital Course: Sherri [name] is an 60 y.o. female who was admitted 12/16/2015 with a diagnosis of Closed left ankle fracture and went to the operating room on 12/16/2015 - 12/17/2015 and underwent the above named procedures.    She was given perioperative antibiotics:  Anti-infectives    Start     Dose/Rate Route Frequency Ordered Stop   12/17/15 0600  ceFAZolin (ANCEF) IVPB 2g/100 mL premix     2 g 200 mL/hr over 30 Minutes Intravenous On call to O.R. 12/17/15 0118 12/17/15 0748   12/17/15 0130  valACYclovir (VALTREX) tablet 500 mg     500 mg Oral 2 times daily 12/17/15 0119      .  She was given sequential compression devices, early ambulation, and ASA for DVT prophylaxis.  She had chest pain overnight on 12-18-15, prompting medical evaluation.  Because of a contrast allergy, contrast CT could not be performed.  VQ scan revealed no PE, Doppler of the lower extremities was negative, EKG had no ischemic changes, and her cardiac enzymes were not elevated.  It was felt that her chest pain was not of cardiac or pulmonary etiology.  IV heparin was discontinued and she was able to anticipate successful in therapy on 12-20-15, where she was cleared for  discharge to her sister-in-law's home with HHPT, touchdown weightbearing on the left lower extremity with a Cam Walker boot.  She benefited maximally from the hospital stay.  Recent vital signs:  Vitals:   12/19/15 2140 12/20/15 0447  BP: 93/73 (!) 157/73  Pulse: 90 87  Resp:  17  Temp:  98.5 F (36.9 C)    Recent laboratory studies:  Lab Results  Component Value Date   HGB 11.7 (L) 12/20/2015   HGB 11.7 (L) 12/19/2015   HGB 12.0 12/16/2015   Lab Results  Component Value Date   WBC 4.4 12/20/2015   PLT 230 12/20/2015   Lab Results  Component Value Date   INR 0.96 12/19/2015   Lab Results  Component Value Date   NA 138 12/20/2015   K 3.6 12/20/2015   CL 102 12/20/2015   CO2 28 12/20/2015   BUN 13 12/20/2015   CREATININE 0.57 12/20/2015   GLUCOSE 127 (H) 12/20/2015    Discharge Medications:     Medication List    STOP taking these medications   Butalbital-Acetaminophen 50-300 MG Tabs   oxyCODONE-acetaminophen 10-325 MG tablet Commonly known as:  PERCOCET     TAKE these medications   ALKA-SELTZER PLS NIGHT CLD/FLU PO Take 1 each by mouth 2 (two) times daily as needed (congestion).   aspirin 81 MG chewable tablet Commonly known as:  ASPIRIN CHILDRENS Chew 1 tablet (81 mg total) by mouth daily.  atorvastatin 20 MG tablet Commonly known as:  LIPITOR Take 1 tablet (20 mg total) by mouth daily.   docusate sodium 100 MG capsule Commonly known as:  COLACE Take 1 capsule (100 mg total) by mouth 2 (two) times daily.   enoxaparin 40 MG/0.4ML injection Commonly known as:  LOVENOX Inject 0.4 mLs (40 mg total) into the skin daily.   hydrochlorothiazide 12.5 MG tablet Commonly known as:  HYDRODIURIL Take 12.5 mg by mouth daily.   meloxicam 15 MG tablet Commonly known as:  MOBIC Take 15 mg by mouth daily.   metaxalone 800 MG tablet Commonly known as:  SKELAXIN Take 1 tablet (800 mg total) by mouth 3 (three) times daily as needed for pain.    metoCLOPramide 10 MG tablet Commonly known as:  REGLAN Take 1 tablet (10 mg total) by mouth every 6 (six) hours as needed for nausea (or headache).   multivitamin with minerals Tabs tablet Take 1 tablet by mouth daily.   oxyCODONE 20 mg 12 hr tablet Commonly known as:  OXYCONTIN Take 1 tablet (20 mg total) by mouth every 12 (twelve) hours.   Oxycodone HCl 10 MG Tabs Take 1 tablet (10 mg total) by mouth every 4 (four) hours as needed.   promethazine 25 MG tablet Commonly known as:  PHENERGAN Take 1 tablet (25 mg total) by mouth every 6 (six) hours as needed for nausea.   valACYclovir 500 MG tablet Commonly known as:  VALTREX Take 500 mg by mouth 2 (two) times daily.            Durable Medical Equipment        Start     Ordered   12/19/15 1207  For home use only DME Walker rolling  Once    Question:  Patient needs a walker to treat with the following condition  Answer:  Closed left ankle fracture   12/19/15 1207   12/19/15 1207  For home use only DME 3 n 1  Once     12/19/15 1207      Diagnostic Studies: Dg Chest 2 View  Result Date: 12/20/2015 CLINICAL DATA:  Chest pain, palpitations for 2 days, former history of smoking EXAM: CHEST  2 VIEW COMPARISON:  Chest x-ray of 12/19/2015 FINDINGS: No active infiltrate or effusion is seen. Mediastinal and hilar contours are unremarkable. The heart is within normal limits in size. No bony abnormality is seen. IMPRESSION: No active cardiopulmonary disease. Electronically Signed   By: Ivar Drape M.D.   On: 12/20/2015 08:54   Dg Chest 2 View  Result Date: 12/11/2015 CLINICAL DATA:  Chest pain. EXAM: CHEST  2 VIEW COMPARISON:  None. FINDINGS: The cardiomediastinal silhouette is unremarkable. There is no evidence of focal airspace disease, pulmonary edema, suspicious pulmonary nodule/mass, pleural effusion, or pneumothorax. No acute bony abnormalities are identified. IMPRESSION: No active cardiopulmonary disease. Electronically  Signed   By: Margarette Canada M.D.   On: 12/11/2015 16:00   Dg Ankle 2 Views Left  Result Date: 12/17/2015 CLINICAL DATA:  Surgery. FLUOROSCOPY TIME:  20 seconds. Images: 3 EXAM: LEFT ANKLE - 2 VIEW COMPARISON:  December 16, 2015 FINDINGS: A plate is now affixed to the fibula across the known fracture. Alignment of the ankle mortise is improved in the interval. IMPRESSION: Interval fibular fracture repair as above. Electronically Signed   By: Dorise Bullion III M.D   On: 12/17/2015 09:00   Dg Ankle Complete Left  Result Date: 12/16/2015 CLINICAL DATA:  Slip and fall on  ice with left ankle pain, edema and deformity. EXAM: LEFT ANKLE COMPLETE - 3+ VIEW COMPARISON:  None. FINDINGS: Oblique mildly displaced distal fibular fracture just proximal to the ankle mortise. There is a mildly displaced posterior tibial tubercle fracture. Marked widening of the medial clear space consistent with ligamentous injury. Diffuse soft tissue edema. IMPRESSION: Trimalleolar injury with displaced fractures of the distal fibula and posterior tibial tubercle. Widening of the medial clear space is consistent with ligamentous injury, medial malleolus fracture component is not seen. Electronically Signed   By: Jeb Levering M.D.   On: 12/16/2015 22:21   Nm Pulmonary Perf And Vent  Result Date: 12/19/2015 CLINICAL DATA:  Left-sided chest pain and shortness of Breath EXAM: NUCLEAR MEDICINE VENTILATION - PERFUSION LUNG SCAN TECHNIQUE: Ventilation images were obtained in multiple projections using inhaled aerosol Tc-25m DTPA. Perfusion images were obtained in multiple projections after intravenous injection of Tc-51m MAA. RADIOPHARMACEUTICALS:  32 mCi Technetium-74m DTPA aerosol inhalation and 4.4 mCi Technetium-37m MAA IV COMPARISON:  Chest x-ray from earlier in the same day FINDINGS: Ventilation: Ventilation images demonstrate adequate uptake throughout both lungs with some mild trapping centrally in the bronchial tree. Perfusion:  Perfusion images demonstrate adequate uptake bilaterally. IMPRESSION: No ventilation-perfusion mismatch to suggest pulmonary embolism is seen. Electronically Signed   By: Inez Catalina M.D.   On: 12/19/2015 12:42   Dg Chest Port 1 View  Result Date: 12/19/2015 CLINICAL DATA:  60 y/o F; sudden onset of chest pain and shortness of breath. EXAM: PORTABLE CHEST 1 VIEW COMPARISON:  None. FINDINGS: Stable cardiac silhouette given projection and technique. Left mid lung zone linear opacity, likely atelectasis. No focal consolidation. No effusion or pneumothorax. Bones are unremarkable. IMPRESSION: Left mid lung zone minor atelectasis.  Otherwise clear lungs. Electronically Signed   By: Kristine Garbe M.D.   On: 12/19/2015 05:48   Dg Chest Portable 1 View  Result Date: 12/16/2015 CLINICAL DATA:  Acute ankle fracture. EXAM: PORTABLE CHEST 1 VIEW COMPARISON:  12/11/2015 FINDINGS: A single AP portable view of the chest demonstrates no focal airspace consolidation or alveolar edema. The lungs are grossly clear. There is no large effusion or pneumothorax. Cardiac and mediastinal contours appear unremarkable. IMPRESSION: No active disease. Electronically Signed   By: Andreas Newport M.D.   On: 12/16/2015 22:49   Dg C-arm 1-60 Min-no Report  Result Date: 12/17/2015 There is no Radiologist interpretation  for this exam.   Disposition: 01-Home or Self Care    Follow-up Information    Schedule an appointment as soon as possible for a visit with Jolyn Nap., MD.   Specialty:  Orthopedic Surgery Why:  for 10-15 days following surgery Contact information: [place] [address] [phone]        [facility] Follow up.   Why:  physical therapy Contact information: [place] [address] [phone]        Harrison Mons, PA-C Follow up in 1 week(s).   Specialty:  Family Medicine Why:  hospital discharge follow up Contact  information: [place] [id number] [phone]        [place] [facility] Follow up.   Specialty:  Cardiology Why:  for outpatietn stress test for left sided chest pain Contact information: [address] [place] [phone]           Signed: [place] Silos, Tiaira Arambula A. 12/20/2015, 6:18 PM

## 2015-12-20 NOTE — Progress Notes (Signed)
Physical Therapy Treatment Patient Details Name: Sherri [name] MRN: [id number] DOB: 10-Jan-1955 Today's Date: 12/20/2015    History of Present Illness s/p ORIF L ankle    PT Comments    Pt ready for D/C to her sister in law house.  Instructed on safe mobility with RW which pt preferred over crutches and knee scooter.  Pt instructed on positioning/elevation of L LE and some ex's to do while she recovers.    Follow Up Recommendations  No PT follow up (pt declines need for [facility] PT)     Equipment Recommendations  Rolling walker with 5" wheels;3in1 (PT)    Recommendations for Other Services       Precautions / Restrictions Precautions Precautions: Fall Restrictions Weight Bearing Restrictions: Yes LLE Weight Bearing: Touchdown weight bearing    Mobility  Bed Mobility Overal bed mobility: Needs Assistance Bed Mobility: Supine to Sit;Sit to Supine     Supine to sit: Min guard Sit to supine: Min guard   General bed mobility comments: increased time  Transfers Overall transfer level: Needs assistance Equipment used: Rolling walker (2 wheeled) Transfers: Sit to/from [facility] Sit to Stand: Supervision;Min guard Stand pivot transfers: Supervision;Min guard       General transfer comment: assist to rise and stabilize VC's safety with turns  Ambulation/Gait Ambulation/Gait assistance: Min guard;Min assist;Mod assist Ambulation Distance (Feet): 60 Feet (20, 20, 20) Assistive device: Rolling walker (2 wheeled);Crutches Gait Pattern/deviations: Step-to pattern     General Gait Details: amb 3 times with 3 different AD.  First with RW at MinGuard/Supervision assist pt did well, no LOB.  Second walk was with B crutches, very unsteady and after trial pt did not like.  Third walk was with knee scooter, even more unsteady and difficult to balance self and steer.  Pt really did not like.  AD of choice for home use is the RW.     Stairs             Wheelchair Mobility    Modified Rankin (Stroke Patients Only)       Balance                                    Cognition Arousal/Alertness: Awake/alert Behavior During Therapy: WFL for tasks assessed/performed Overall Cognitive Status: Within Functional Limits for tasks assessed                      Exercises      General Comments        Pertinent Vitals/Pain Pain Assessment: Faces Faces Pain Scale: Hurts even more Pain Location: ankle Pain Descriptors / Indicators: Grimacing;Aching Pain Intervention(s): Monitored during session;Repositioned;Ice applied    Home Living                      Prior Function            PT Goals (current goals can now be found in the care plan section)      Frequency    7X/week      PT Plan Current plan remains appropriate    Co-evaluation             End of Session Equipment Utilized During Treatment: Gait belt Activity Tolerance: Patient tolerated treatment well;Patient limited by fatigue Patient left: in bed     Time: 1413-1451 PT Time Calculation (min) (ACUTE ONLY): 38 min  Charges:  $Gait Training: 23-37 mins $Therapeutic Activity: 8-22 mins                    G Codes:      Rica Koyanagi  PTA [facility]  Acute  Rehab Pager      [phone] .

## 2015-12-20 NOTE — Progress Notes (Signed)
Pt selected [place] for [facility], referral was given to in house rep.

## 2015-12-20 NOTE — Progress Notes (Signed)
  PATIENT ID: Sherri [name]  MRN: [id number]  DOB/AGE:  Nov 12, 1955 / 60 y.o.  3 Days Post-Op Procedure(s) (LRB): OPEN REDUCTION INTERNAL FIXATION (ORIF) ANKLE FRACTURE (Left)  Subjective: CP w/u so far negative for cardiac or PE causes IV heparin stopped Tol PO diet, meds, foot elevated, feels better in CAM walker No PT yesteday due to medical tx/w/u  Objective: Vital signs in last 24 hours: Temp:  [98.5 F (36.9 C)-99 F (37.2 C)] 98.5 F (36.9 C) (12/13 0447) Pulse Rate:  [87-95] 87 (12/13 0447) Resp:  [16-17] 17 (12/13 0447) BP: (93-162)/(73-93) 157/73 (12/13 0447) SpO2:  [96 %-98 %] 96 % (12/13 0447)  Intake/Output from previous day: No intake/output data recorded. Intake/Output this shift: No intake/output data recorded.   Recent Labs  12/19/15 0434 12/20/15 0512  HGB 11.7* 11.7*    Recent Labs  12/19/15 0434 12/20/15 0512  WBC 5.8 4.4  RBC 4.15 4.19  HCT 36.3 36.6  PLT 229 230    Recent Labs  12/19/15 0434 12/20/15 0512  NA 138 138  K 3.0* 3.6  CL 101 102  CO2 29 28  BUN 7 13  CREATININE 0.45 0.57  GLUCOSE 132* 127*  CALCIUM 8.4* 8.9    Recent Labs  12/19/15 0805  INR 0.96    Physical Exam: Can flex/ext toes actively without prohibitive pain.  Toes warm, CR brisk, DP palp, intact LT sens P/D/1web Calf soft, supple  Assessment/Plan: 3 Days Post-Op Procedure(s) (LRB): OPEN REDUCTION INTERNAL FIXATION (ORIF) ANKLE FRACTURE (Left)   Resume PT   D/C to sister-in-law home with HHPT, possibly today if clears PT Home walker and 3in1 ordered. D/c on lovenox x 2 weeks  Shanon Brow A. Grandville Silos, [place] [place], [place]  [address] Office: [phone] Mobile: [phone]  12/20/2015, 9:32 AM

## 2015-12-20 NOTE — Progress Notes (Signed)
Pt states that she will not need HHPT at this time, she continued to say, "I can do what PT shoed me here."

## 2015-12-26 ENCOUNTER — Telehealth: Payer: Self-pay

## 2015-12-26 NOTE — Telephone Encounter (Signed)
CHELLE - Pt is almost out of the [facility] and Valtrex.  She was supposed to come back in but has had a terrible ankle injury. Can we refill? [phone]

## 2015-12-28 MED ORDER — HYDROCHLOROTHIAZIDE 12.5 MG PO TABS: 12.5000 mg | ORAL_TABLET | Freq: Every day | ORAL | 0 refills | Status: DC

## 2015-12-28 MED ORDER — VALACYCLOVIR HCL 500 MG PO TABS: 500.0000 mg | ORAL_TABLET | Freq: Two times a day (BID) | ORAL | 0 refills | Status: DC

## 2015-12-28 NOTE — Telephone Encounter (Signed)
12/11/15 last ov with harris 02/2014 last visit with chelle

## 2015-12-28 NOTE — Telephone Encounter (Signed)
Okay to refill both HCTZ and Valtrex.

## 2015-12-29 NOTE — Telephone Encounter (Signed)
LMOM advising RFs had been sent in yesterday.

## 2016-01-02 ENCOUNTER — Telehealth: Payer: Self-pay

## 2016-01-02 NOTE — Telephone Encounter (Signed)
Pt had called and LM stating that she thinks she needs to add an antidepressant to her medication regime, thinking that will also help her BP. Called her back and LM that we will be happy to see her to discuss, but she will need to RTC to discuss any med additions or changes.

## 2016-09-03 DIAGNOSIS — M47816 Spondylosis without myelopathy or radiculopathy, lumbar region: Secondary | ICD-10-CM | POA: Insufficient documentation

## 2016-09-03 DIAGNOSIS — M5020 Other cervical disc displacement, unspecified cervical region: Secondary | ICD-10-CM | POA: Insufficient documentation

## 2016-09-03 DIAGNOSIS — M4302 Spondylolysis, cervical region: Secondary | ICD-10-CM | POA: Insufficient documentation

## 2016-09-10 ENCOUNTER — Other Ambulatory Visit: Payer: Self-pay | Admitting: Nurse Practitioner

## 2016-09-10 ENCOUNTER — Ambulatory Visit
Admission: RE | Admit: 2016-09-10 | Discharge: 2016-09-10 | Disposition: A | Discharge status: Home / Self Care | Source: Ambulatory Visit | Attending: Nurse Practitioner | Admitting: Nurse Practitioner

## 2016-09-10 DIAGNOSIS — R52 Pain, unspecified: Secondary | ICD-10-CM

## 2016-09-23 ENCOUNTER — Other Ambulatory Visit: Payer: Self-pay | Admitting: Orthopaedic Surgery

## 2016-10-02 ENCOUNTER — Other Ambulatory Visit (HOSPITAL_COMMUNITY): Payer: Self-pay | Admitting: *Deleted

## 2016-10-02 NOTE — Pre-Procedure Instructions (Addendum)
Sherri [name]  10/02/2016    Your procedure is scheduled on Tuesday, October 15, 2016 at 7:30 AM.   Report to [facility] Entrance "A" Admitting Office at 5:30 AM.   Call this number if you have problems the morning of surgery: [phone]   Questions prior to day of surgery, please call [phone] between 8 & 4 PM.   Remember:  Do not eat food or drink liquids after midnight Monday, 10/14/16.  Take these medicines the morning of surgery with A SIP OF WATER: Oxycodone - if needed  Stop Herbal medications and NSAIDS (Meloxicam, Ibuprofen, Aleve, etc) 5 days prior to surgery.   Do not use Aspirin products prior to surgery.   Do not wear jewelry, make-up or nail polish.  Do not wear lotions, powders, perfumes or deodorant.  Do not shave 48 hours prior to surgery.   Do not bring valuables to the hospital.  [facility] is not responsible for any belongings or valuables.  Contacts, dentures or bridgework may not be worn into surgery.  Leave your suitcase in the car.  After surgery it may be brought to your room.  For patients admitted to the hospital, discharge time will be determined by your treatment team.  [facility] - Preparing for Surgery  Before surgery, you can play an important role.  Because skin is not sterile, your skin needs to be as free of germs as possible.  You can reduce the number of germs on you skin by washing with CHG (chlorahexidine gluconate) soap before surgery.  CHG is an antiseptic cleaner which kills germs and bonds with the skin to continue killing germs even after washing.  Please DO NOT use if you have an allergy to CHG or antibacterial soaps.  If your skin becomes reddened/irritated stop using the CHG and inform your nurse when you arrive at Short Stay.  Do not shave (including legs and underarms) for at least 48 hours prior to the first CHG shower.  You may shave your face.  Please follow these instructions carefully:   1.  Shower with  CHG Soap the night before surgery and the                    morning of Surgery.  2.  If you choose to wash your hair, wash your hair first as usual with your       normal shampoo.  3.  After you shampoo, rinse your hair and body thoroughly to remove the shampoo.  4.  Use CHG as you would any other liquid soap.  You can apply chg directly       to the skin and wash gently with scrungie or a clean washcloth.  5.  Apply the CHG Soap to your body ONLY FROM THE NECK DOWN.        Do not use on open wounds or open sores.  Avoid contact with your eyes, ears, mouth and genitals (private parts).  Wash genitals (private parts) with your normal soap.  6.  Wash thoroughly, paying special attention to the area where your surgery        will be performed.  7.  Thoroughly rinse your body with warm water from the neck down.  8.  DO NOT shower/wash with your normal soap after using and rinsing off       the CHG Soap.  9.  Pat yourself dry with a clean towel.  10.  Wear clean pajamas.            11.  Place clean sheets on your bed the night of your first shower and do not        sleep with pets.  Day of Surgery  Do not apply any lotions/deodorants the morning of surgery.  Please wear clean clothes to the hospital.   Please read over the fact sheets that you were given.

## 2016-10-03 ENCOUNTER — Encounter (HOSPITAL_COMMUNITY)
Admission: RE | Admit: 2016-10-03 | Discharge: 2016-10-03 | Disposition: A | Discharge status: Home / Self Care | Source: Ambulatory Visit | Attending: Orthopaedic Surgery | Admitting: Orthopaedic Surgery

## 2016-10-03 ENCOUNTER — Encounter (HOSPITAL_COMMUNITY): Payer: Self-pay

## 2016-10-03 DIAGNOSIS — Z01812 Encounter for preprocedural laboratory examination: Secondary | ICD-10-CM | POA: Diagnosis present

## 2016-10-03 HISTORY — DX: Pneumonia, unspecified organism: J18.9

## 2016-10-03 HISTORY — DX: Unspecified osteoarthritis, unspecified site: M19.90

## 2016-10-03 HISTORY — DX: Malignant (primary) neoplasm, unspecified: C80.1

## 2016-10-03 LAB — CBC WITH DIFFERENTIAL/PLATELET
Basophils Absolute: 0 10*3/uL (ref 0.0–0.1)
Basophils Relative: 0 %
EOS ABS: 0.1 10*3/uL (ref 0.0–0.7)
EOS PCT: 2 %
HCT: 40.5 % (ref 36.0–46.0)
HEMOGLOBIN: 12.9 g/dL (ref 12.0–15.0)
LYMPHS ABS: 2.2 10*3/uL (ref 0.7–4.0)
LYMPHS PCT: 40 %
MCH: 28 pg (ref 26.0–34.0)
MCHC: 31.9 g/dL (ref 30.0–36.0)
MCV: 87.9 fL (ref 78.0–100.0)
MONO ABS: 0.3 10*3/uL (ref 0.1–1.0)
MONOS PCT: 5 %
Neutro Abs: 2.9 10*3/uL (ref 1.7–7.7)
Neutrophils Relative %: 53 %
PLATELETS: 288 10*3/uL (ref 150–400)
RBC: 4.61 MIL/uL (ref 3.87–5.11)
RDW: 13.4 % (ref 11.5–15.5)
WBC: 5.5 10*3/uL (ref 4.0–10.5)

## 2016-10-03 LAB — SURGICAL PCR SCREEN
MRSA, PCR: NEGATIVE
Staphylococcus aureus: NEGATIVE

## 2016-10-03 LAB — BASIC METABOLIC PANEL
Anion gap: 7 (ref 5–15)
BUN: 9 mg/dL (ref 6–20)
CHLORIDE: 103 mmol/L (ref 101–111)
CO2: 27 mmol/L (ref 22–32)
CREATININE: 0.74 mg/dL (ref 0.44–1.00)
Calcium: 9 mg/dL (ref 8.9–10.3)
GFR calc Af Amer: >60 mL/min (ref >60)
GFR calc non Af Amer: >60 mL/min (ref >60)
GLUCOSE: 89 mg/dL (ref 65–99)
POTASSIUM: 3.2 mmol/L — AB (ref 3.5–5.1)
SODIUM: 137 mmol/L (ref 135–145)

## 2016-10-03 LAB — URINALYSIS, ROUTINE W REFLEX MICROSCOPIC
BACTERIA UA: NONE SEEN
Bilirubin Urine: NEGATIVE
Glucose, UA: NEGATIVE mg/dL
Hgb urine dipstick: NEGATIVE
KETONES UR: NEGATIVE mg/dL
Nitrite: NEGATIVE
PH: 5 (ref 5.0–8.0)
PROTEIN: NEGATIVE mg/dL
Specific Gravity, Urine: 1.03 (ref 1.005–1.030)

## 2016-10-03 LAB — TYPE AND SCREEN
ABO/RH(D): A POS
ANTIBODY SCREEN: NEGATIVE

## 2016-10-03 LAB — APTT: aPTT: 27 seconds (ref 24–36)

## 2016-10-03 LAB — PROTIME-INR
INR: 0.9
Prothrombin Time: 12.1 seconds (ref 11.4–15.2)

## 2016-10-03 NOTE — Progress Notes (Signed)
Pt denies cardiac history. Had an Echo done last December due to having chest pain after surgery. Determined that it was not cardiac or pulmonary related. Pt states she's not taking her fluid pill anymore because her PCP won't refill it. She states she has been told several times this year that they would not refill it. She states she goes to [facility] on [address] for her primary care. There are no visits noted since 12/11/15 and 2 telephone notes from 12/2015. Pt stated that she doesn't know why there aren't any visits documented. Pt denies any recent chest pain or sob. Pt states she is not diabetic.

## 2016-10-04 NOTE — Progress Notes (Signed)
Anesthesia Chart Review:  Pt is a 61 year old female scheduled for R total hip arthroplasty anterior approach on 10/15/2016 with Melrose Nakayama, MD  PMH includes:  HTN (currently off meds).  Former smoker. BMI 34. S/p ORIF L ankle fracture 12/17/15.   Medications reviewed.  BP (!) 158/57   Pulse 82   Temp 36.8 C   Resp 20   Ht 5' 8.5" (1.74 m)   Wt 227 lb 1.6 oz (103 kg)   SpO2 99%   BMI 34.03 kg/m   Preoperative labs reviewed.    CXR 12/20/15: No active cardiopulmonary disease.  EKG 12/19/15: NSR. Nonspecific T wave abnormality. Prolonged QT   Echo 12/19/15:  - Left ventricle: The cavity size was normal. Systolic function was vigorous. The estimated ejection fraction was in the range of 65% to 70%. Wall motion was normal; there were no regional wall motion abnormalities. Doppler parameters are consistent with abnormal left ventricular relaxation (grade 1 diastolic dysfunction). - Right ventricle: The cavity size was normal. Wall thickness was normal. Systolic function was normal.  Pt reported to PAT RN she stopped BP meds about 8 months ago due to cost.  BP at PAT 158/57.  I notified Juliann Pulse in Dr. Jerald Kief office of pt's untreated HTN.   If BP acceptable DOS, I anticipate pt can proceed as scheduled.   Willeen Cass, FNP-BC [facility] Short Stay Surgical Center/Anesthesiology Phone: [phone] 10/04/2016 1:29 PM

## 2016-10-10 NOTE — H&P (Signed)
TOTAL HIP ADMISSION H&P  Patient is admitted for right total hip arthroplasty.  Subjective:  Chief Complaint: right hip pain  HPI: Sherri [name], 61 y.o. female, has a history of pain and functional disability in the right hip(s) due to arthritis and patient has failed non-surgical conservative treatments for greater than 12 weeks to include NSAID's and/or analgesics, corticosteriod injections, flexibility and strengthening excercises, use of assistive devices, weight reduction as appropriate and activity modification.  Onset of symptoms was gradual starting 5 years ago with gradually worsening course since that time.The patient noted no past surgery on the right hip(s).  Patient currently rates pain in the right hip at 10 out of 10 with activity. Patient has night pain, worsening of pain with activity and weight bearing, trendelenberg gait, pain that interfers with activities of daily living and crepitus. Patient has evidence of subchondral cysts, subchondral sclerosis, periarticular osteophytes and joint space narrowing by imaging studies. This condition presents safety issues increasing the risk of falls.  There is no current active infection.  Patient Active Problem List   Diagnosis Date Noted  . Chest pain 12/19/2015  . Closed left ankle fracture 12/17/2015  . Intractable migraine without aura and without status migrainosus 09/23/2013  . Essential hypertension 09/23/2013  . Obesity, unspecified 09/23/2013  . Migraine 02/14/2011  . Obesity 02/14/2011  . 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 Blindness of right eye 02/14/2011  . Depression 02/14/2011   Past Medical History:  Diagnosis Date  . Anxiety   . Arthritis   . Breast pain, right   . Cancer [facility])    ? breast, cervical   . Depression    situational  . Hypertension    not taken atenolol or lisinopril in >89mos, no money  . Migraines   . Pneumonia   . Ruptured disk     Past Surgical History:  Procedure  Laterality Date  . BREAST SURGERY     lumpectomy in 20s for benign mass with implant placement (subglandular)  . CARPAL TUNNEL RELEASE     right wrist  . CARPAL TUNNEL RELEASE Right 09/02/2014   Procedure: RIGHT CARPAL TUNNEL RELEASE;  Surgeon: Melrose Nakayama, MD;  Location: [place];  Service: Orthopedics;  Laterality: Right;  . CERVICAL CONE BIOPSY    . COLONOSCOPY    . KNEE ARTHROSCOPY Left 07/1996  . KNEE ARTHROSCOPY Right 1998  . ORIF ANKLE FRACTURE Left 12/17/2015   Procedure: OPEN REDUCTION INTERNAL FIXATION (ORIF) ANKLE FRACTURE;  Surgeon: Milly Jakob, MD;  Location: [facility];  Service: Orthopedics;  Laterality: Left;  . SPINE SURGERY  72011   L2-L5 stabilization    No prescriptions prior to admission.   Allergies  Allergen Reactions  . Effexor [Venlafaxine Hydrochloride] Shortness Of Breath, Rash and Other (See Comments)    HALLUCINATIONS ALSO  . Iodine Anaphylaxis  . Shellfish Allergy Anaphylaxis  . Flexeril [Cyclobenzaprine Hcl] Other (See Comments)    HALLUCINATIONS AND "OUT OF CONTROL"  . Hydromorphone Other (See Comments)    HALLUCINATIONS  . Nubain [Nalbuphine Hcl] Other (See Comments)    HALLUCINATIONS  . Codeine Swelling and Rash  . Erythromycin Rash    Social History  Substance Use Topics  . Smoking status: Former Smoker    Years: 1.00    Types: Cigarettes    Quit date: 01/08/1976  . Smokeless tobacco: Never Used  . Alcohol use No    Family History  Problem Relation Age of Onset  . Diabetes Mother   .  Glaucoma Mother   . Cancer Mother        breast, ovarian  . Heart disease Mother   . Gout Father   . Cancer Sister        ovarian, uterine cancer     Review of Systems  Musculoskeletal: Positive for joint pain.       Right hip  All other systems reviewed and are negative.   Objective:  Physical Exam  Constitutional: She is oriented to person, place, and time. She appears well-developed and well-nourished.  HENT:  Head:  Normocephalic and atraumatic.  Eyes: Pupils are equal, round, and reactive to light.  Neck: Normal range of motion.  Cardiovascular: Normal rate.   Respiratory: Effort normal.  GI: Soft.  Musculoskeletal:  Right hip motion is extremely painful in internal rotation.  Motion is limited.  Straight leg raises causes some back pain and pain into her thigh but nothing past the knee.  Sensation and motor function are intact distally.  She has palpable pulses in her feet.    Neurological: She is alert and oriented to person, place, and time.  Skin: Skin is warm and dry.  Psychiatric: She has a normal mood and affect. Her behavior is normal. Judgment and thought content normal.    Vital signs in last 24 hours:    Labs:   Estimated body mass index is 34.03 kg/m as calculated from the following:   Height as of 10/03/16: 5' 8.5" (1.74 m).   Weight as of 10/03/16: 103 kg (227 lb 1.6 oz).   Imaging Review Plain radiographs demonstrate severe degenerative joint disease of the right hip(s). The bone quality appears to be good for age and reported activity level.  Assessment/Plan:  End stage primary arthritis, right hip(s)  The patient history, physical examination, clinical judgement of the provider and imaging studies are consistent with end stage degenerative joint disease of the right hip(s) and total hip arthroplasty is deemed medically necessary. The treatment options including medical management, injection therapy, arthroscopy and arthroplasty were discussed at length. The risks and benefits of total hip arthroplasty were presented and reviewed. The risks due to aseptic loosening, infection, stiffness, dislocation/subluxation,  thromboembolic complications and other imponderables were discussed.  The patient acknowledged the explanation, agreed to proceed with the plan and consent was signed. Patient is being admitted for inpatient treatment for surgery, pain control, PT, OT, prophylactic  antibiotics, VTE prophylaxis, progressive ambulation and ADL's and discharge planning.The patient is planning to be discharged home with home health services

## 2016-10-14 MED ORDER — LACTATED RINGERS IV SOLN
INTRAVENOUS | Status: DC
  Administered 2016-10-15 (×2): via INTRAVENOUS

## 2016-10-14 MED ORDER — TRANEXAMIC ACID 1000 MG/10ML IV SOLN
2000.0000 mg | INTRAVENOUS | Status: AC
  Administered 2016-10-15: 2000 mg via TOPICAL
  Filled 2016-10-14: qty 20

## 2016-10-14 MED ORDER — CEFAZOLIN SODIUM-DEXTROSE 2-4 GM/100ML-% IV SOLN
2.0000 g | INTRAVENOUS | Status: AC
  Administered 2016-10-15: 2 g via INTRAVENOUS

## 2016-10-14 NOTE — Anesthesia Preprocedure Evaluation (Addendum)
Anesthesia Evaluation  Patient identified by MRN, date of birth, ID band Patient awake    Reviewed: Allergy & Precautions, NPO status , Patient's Chart, lab work & pertinent test results  Airway Mallampati: II  TM Distance: >3 FB Neck ROM: Full    Dental no notable dental hx. 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 Nonspecific T wave abnormality. Prolonged QT   Echo 12/19/15:  - Left ventricle: The cavity size was normal. Systolic function was vigorous. The estimated ejection fraction was in the range of 65% to 70%. Wall motion was normal; there were no regional wall motion abnormalities. Doppler parameters are consistent with abnormal left ventricular relaxation (grade 1 diastolic dysfunction). - Right ventricle: The cavity size was normal. Wall thickness was normal. Systolic function was normal.  Pt reported to PAT RN she stopped BP meds about 8 months ago due to cost.  BP at PAT 158/57.  I notified Juliann Pulse in Dr. Jerald Kief office of pt's untreated HTN.   If BP acceptable DOS, I anticipate pt can proceed as scheduled.   Reproductive/Obstetrics                           EKG: normal EKG, normal sinus rhythm. Lab Results  Component Value Date   WBC 5.5  10/03/2016   HGB 12.9 10/03/2016   HCT 40.5 10/03/2016   MCV 87.9 10/03/2016   PLT 288 10/03/2016     Chemistry      Component Value Date/Time   NA 137 10/03/2016 0859   K 3.2 (L) 10/03/2016 0859   CL 103 10/03/2016 0859   CO2 27 10/03/2016 0859   BUN 9 10/03/2016 0859   CREATININE 0.74 10/03/2016 0859   CREATININE 0.50 09/23/2013 1405      Component Value Date/Time   CALCIUM 9.0 10/03/2016 0859   ALKPHOS 67 09/23/2013 1405   AST 42 (H) 09/23/2013 1405   ALT 28 09/23/2013 1405   BILITOT 0.4 09/23/2013 1405      Anesthesia Physical  Anesthesia Plan  ASA: III  Anesthesia Plan: Spinal   Post-op Pain Management:    Induction:   PONV Risk Score and Plan: 3 and Ondansetron, Dexamethasone, Midazolam and Treatment may vary due to age or medical condition  Airway Management Planned: Natural Airway and Nasal Cannula  Additional Equipment:   Intra-op Plan:   Post-operative Plan:   Informed Consent: I have reviewed the patients History and Physical, chart, labs and discussed the procedure including the risks, benefits and alternatives for the proposed anesthesia with the patient or authorized representative who has indicated his/her understanding and acceptance.     Plan Discussed with: Anesthesiologist, CRNA and Surgeon  Anesthesia Plan Comments: (9/18 Lumbar spine   FINDINGS: Prior fusion L3-4 and L4-5 with interbody spacers.  Moderate-to-marked L2-3 disc degeneration with disc space narrowing and osteophyte.  This represents change compared to 2014 MR.  L5-S1 facet degenerative changes.)       Anesthesia Quick Evaluation

## 2016-10-15 ENCOUNTER — Encounter (HOSPITAL_COMMUNITY): Payer: Self-pay

## 2016-10-15 ENCOUNTER — Inpatient Hospital Stay (HOSPITAL_COMMUNITY)
Admission: RE | Admit: 2016-10-15 | Discharge: 2016-10-18 | DRG: 470 | Disposition: A | Discharge status: Home / Self Care | Source: Ambulatory Visit | Attending: Orthopaedic Surgery | Admitting: Orthopaedic Surgery

## 2016-10-15 ENCOUNTER — Inpatient Hospital Stay (HOSPITAL_COMMUNITY): Admitting: Anesthesiology

## 2016-10-15 ENCOUNTER — Encounter (HOSPITAL_COMMUNITY)
Admission: RE | Disposition: A | Discharge status: Home / Self Care | Payer: Self-pay | Source: Ambulatory Visit | Attending: Orthopaedic Surgery

## 2016-10-15 ENCOUNTER — Inpatient Hospital Stay (HOSPITAL_COMMUNITY): Admitting: Emergency Medicine

## 2016-10-15 ENCOUNTER — Inpatient Hospital Stay (HOSPITAL_COMMUNITY)

## 2016-10-15 DIAGNOSIS — Z87891 Personal history of nicotine dependence: Secondary | ICD-10-CM

## 2016-10-15 DIAGNOSIS — Z885 Allergy status to narcotic agent status: Secondary | ICD-10-CM

## 2016-10-15 DIAGNOSIS — Z83511 Family history of glaucoma: Secondary | ICD-10-CM | POA: Diagnosis not present

## 2016-10-15 DIAGNOSIS — X58XXXA Exposure to other specified factors, initial encounter: Secondary | ICD-10-CM | POA: Diagnosis not present

## 2016-10-15 DIAGNOSIS — Z23 Encounter for immunization: Secondary | ICD-10-CM

## 2016-10-15 DIAGNOSIS — E669 Obesity, unspecified: Secondary | ICD-10-CM | POA: Diagnosis present

## 2016-10-15 DIAGNOSIS — I10 Essential (primary) hypertension: Secondary | ICD-10-CM | POA: Diagnosis present

## 2016-10-15 DIAGNOSIS — Z419 Encounter for procedure for purposes other than remedying health state, unspecified: Secondary | ICD-10-CM

## 2016-10-15 DIAGNOSIS — Z6834 Body mass index (BMI) 34.0-34.9, adult: Secondary | ICD-10-CM

## 2016-10-15 DIAGNOSIS — Y92009 Unspecified place in unspecified non-institutional (private) residence as the place of occurrence of the external cause: Secondary | ICD-10-CM

## 2016-10-15 DIAGNOSIS — Z833 Family history of diabetes mellitus: Secondary | ICD-10-CM | POA: Diagnosis not present

## 2016-10-15 DIAGNOSIS — M1611 Unilateral primary osteoarthritis, right hip: Secondary | ICD-10-CM | POA: Diagnosis present

## 2016-10-15 DIAGNOSIS — Z881 Allergy status to other antibiotic agents status: Secondary | ICD-10-CM | POA: Diagnosis not present

## 2016-10-15 DIAGNOSIS — Z8049 Family history of malignant neoplasm of other genital organs: Secondary | ICD-10-CM | POA: Diagnosis not present

## 2016-10-15 DIAGNOSIS — Z91120 Patient's intentional underdosing of medication regimen due to financial hardship: Secondary | ICD-10-CM | POA: Diagnosis not present

## 2016-10-15 DIAGNOSIS — T464X6A Underdosing of angiotensin-converting-enzyme inhibitors, initial encounter: Secondary | ICD-10-CM | POA: Diagnosis present

## 2016-10-15 DIAGNOSIS — G43909 Migraine, unspecified, not intractable, without status migrainosus: Secondary | ICD-10-CM | POA: Diagnosis present

## 2016-10-15 DIAGNOSIS — Z8249 Family history of ischemic heart disease and other diseases of the circulatory system: Secondary | ICD-10-CM

## 2016-10-15 DIAGNOSIS — F329 Major depressive disorder, single episode, unspecified: Secondary | ICD-10-CM | POA: Diagnosis present

## 2016-10-15 DIAGNOSIS — S2190XA Unspecified open wound of unspecified part of thorax, initial encounter: Secondary | ICD-10-CM | POA: Diagnosis not present

## 2016-10-15 DIAGNOSIS — S46912A Strain of unspecified muscle, fascia and tendon at shoulder and upper arm level, left arm, initial encounter: Secondary | ICD-10-CM | POA: Diagnosis not present

## 2016-10-15 DIAGNOSIS — Z888 Allergy status to other drugs, medicaments and biological substances status: Secondary | ICD-10-CM | POA: Diagnosis not present

## 2016-10-15 DIAGNOSIS — F419 Anxiety disorder, unspecified: Secondary | ICD-10-CM | POA: Diagnosis present

## 2016-10-15 DIAGNOSIS — Z803 Family history of malignant neoplasm of breast: Secondary | ICD-10-CM

## 2016-10-15 DIAGNOSIS — Y92230 Patient room in hospital as the place of occurrence of the external cause: Secondary | ICD-10-CM | POA: Diagnosis not present

## 2016-10-15 DIAGNOSIS — Z91013 Allergy to seafood: Secondary | ICD-10-CM | POA: Diagnosis not present

## 2016-10-15 DIAGNOSIS — S29011A Strain of muscle and tendon of front wall of thorax, initial encounter: Secondary | ICD-10-CM | POA: Diagnosis not present

## 2016-10-15 DIAGNOSIS — T447X6A Underdosing of beta-adrenoreceptor antagonists, initial encounter: Secondary | ICD-10-CM | POA: Diagnosis present

## 2016-10-15 DIAGNOSIS — H5461 Unqualified visual loss, right eye, normal vision left eye: Secondary | ICD-10-CM | POA: Diagnosis present

## 2016-10-15 HISTORY — PX: TOTAL HIP ARTHROPLASTY: SHX124

## 2016-10-15 SURGERY — ARTHROPLASTY, HIP, TOTAL, ANTERIOR APPROACH
Anesthesia: Spinal | Laterality: Right

## 2016-10-15 MED ORDER — SUGAMMADEX SODIUM 200 MG/2ML IV SOLN
INTRAVENOUS | Status: AC
  Filled 2016-10-15: qty 2

## 2016-10-15 MED ORDER — DEXMEDETOMIDINE HCL 200 MCG/2ML IV SOLN
INTRAVENOUS | Status: DC | PRN
  Administered 2016-10-15: 16 ug via INTRAVENOUS
  Administered 2016-10-15 (×3): 8 ug via INTRAVENOUS

## 2016-10-15 MED ORDER — MORPHINE SULFATE (PF) 4 MG/ML IV SOLN
1.0000 mg | INTRAVENOUS | Status: DC | PRN
  Administered 2016-10-15 – 2016-10-17 (×10): 2 mg via INTRAVENOUS
  Filled 2016-10-15 (×10): qty 1

## 2016-10-15 MED ORDER — CHLORHEXIDINE GLUCONATE 4 % EX LIQD: 60.0000 mL | Freq: Once | CUTANEOUS | Status: DC

## 2016-10-15 MED ORDER — ACETAMINOPHEN 325 MG PO TABS
650.0000 mg | ORAL_TABLET | Freq: Four times a day (QID) | ORAL | Status: DC | PRN
  Administered 2016-10-15 – 2016-10-18 (×3): 650 mg via ORAL
  Filled 2016-10-15 (×3): qty 2

## 2016-10-15 MED ORDER — EPHEDRINE SULFATE 50 MG/ML IJ SOLN
INTRAMUSCULAR | Status: DC | PRN
  Administered 2016-10-15: 15 mg via INTRAVENOUS
  Administered 2016-10-15 (×2): 10 mg via INTRAVENOUS

## 2016-10-15 MED ORDER — BISACODYL 5 MG PO TBEC: 5.0000 mg | DELAYED_RELEASE_TABLET | Freq: Every day | ORAL | Status: DC | PRN

## 2016-10-15 MED ORDER — KETAMINE HCL-SODIUM CHLORIDE 100-0.9 MG/10ML-% IV SOSY
PREFILLED_SYRINGE | INTRAVENOUS | Status: AC
  Filled 2016-10-15: qty 10

## 2016-10-15 MED ORDER — BUPIVACAINE LIPOSOME 1.3 % IJ SUSP
20.0000 mL | INTRAMUSCULAR | Status: AC
  Administered 2016-10-15: 20 mL
  Filled 2016-10-15: qty 20

## 2016-10-15 MED ORDER — BUTALBITAL-APAP-CAFFEINE 50-325-40 MG PO TABS
1.0000 | ORAL_TABLET | ORAL | Status: DC | PRN
  Administered 2016-10-16 (×3): 1 via ORAL
  Filled 2016-10-15 (×6): qty 1

## 2016-10-15 MED ORDER — METOCLOPRAMIDE HCL 5 MG PO TABS: 5.0000 mg | ORAL_TABLET | Freq: Three times a day (TID) | ORAL | Status: DC | PRN

## 2016-10-15 MED ORDER — ONDANSETRON HCL 4 MG/2ML IJ SOLN
INTRAMUSCULAR | Status: AC
  Filled 2016-10-15: qty 2

## 2016-10-15 MED ORDER — ASPIRIN EC 325 MG PO TBEC
325.0000 mg | DELAYED_RELEASE_TABLET | Freq: Two times a day (BID) | ORAL | Status: DC
  Administered 2016-10-16 – 2016-10-18 (×5): 325 mg via ORAL
  Filled 2016-10-15 (×5): qty 1

## 2016-10-15 MED ORDER — ONDANSETRON HCL 4 MG/2ML IJ SOLN
4.0000 mg | Freq: Four times a day (QID) | INTRAMUSCULAR | Status: DC | PRN
  Administered 2016-10-16: 4 mg via INTRAVENOUS
  Filled 2016-10-15: qty 2

## 2016-10-15 MED ORDER — METHOCARBAMOL 500 MG PO TABS
500.0000 mg | ORAL_TABLET | Freq: Four times a day (QID) | ORAL | Status: DC | PRN
  Administered 2016-10-15 – 2016-10-18 (×8): 500 mg via ORAL
  Filled 2016-10-15 (×7): qty 1

## 2016-10-15 MED ORDER — LIDOCAINE HCL (CARDIAC) 20 MG/ML IV SOLN
INTRAVENOUS | Status: DC | PRN
  Administered 2016-10-15: 100 mg via INTRATRACHEAL

## 2016-10-15 MED ORDER — ROCURONIUM BROMIDE 10 MG/ML (PF) SYRINGE
PREFILLED_SYRINGE | INTRAVENOUS | Status: AC
  Filled 2016-10-15: qty 5

## 2016-10-15 MED ORDER — SODIUM CHLORIDE 0.9 % IV SOLN
1000.0000 mg | Freq: Once | INTRAVENOUS | Status: AC
  Administered 2016-10-15: 1000 mg via INTRAVENOUS
  Filled 2016-10-15: qty 10

## 2016-10-15 MED ORDER — MIDAZOLAM HCL 2 MG/2ML IJ SOLN
INTRAMUSCULAR | Status: AC
  Filled 2016-10-15: qty 2

## 2016-10-15 MED ORDER — MORPHINE SULFATE (PF) 2 MG/ML IV SOLN: 1.0000 mg | INTRAVENOUS | Status: DC | PRN

## 2016-10-15 MED ORDER — FENTANYL CITRATE (PF) 250 MCG/5ML IJ SOLN
INTRAMUSCULAR | Status: AC
  Filled 2016-10-15: qty 5

## 2016-10-15 MED ORDER — FENTANYL CITRATE (PF) 100 MCG/2ML IJ SOLN
INTRAMUSCULAR | Status: AC
  Filled 2016-10-15: qty 2

## 2016-10-15 MED ORDER — LACTATED RINGERS IV SOLN
INTRAVENOUS | Status: DC
  Administered 2016-10-15 – 2016-10-17 (×3): via INTRAVENOUS

## 2016-10-15 MED ORDER — ONDANSETRON HCL 4 MG/2ML IJ SOLN
INTRAMUSCULAR | Status: DC | PRN
  Administered 2016-10-15: 4 mg via INTRAVENOUS

## 2016-10-15 MED ORDER — ONDANSETRON HCL 4 MG PO TABS
4.0000 mg | ORAL_TABLET | Freq: Four times a day (QID) | ORAL | Status: DC | PRN
  Filled 2016-10-15: qty 1

## 2016-10-15 MED ORDER — ACETAMINOPHEN 10 MG/ML IV SOLN
1000.0000 mg | Freq: Once | INTRAVENOUS | Status: AC
  Administered 2016-10-15: 1000 mg via INTRAVENOUS

## 2016-10-15 MED ORDER — BUPIVACAINE-EPINEPHRINE (PF) 0.5% -1:200000 IJ SOLN
INTRAMUSCULAR | Status: DC | PRN
  Administered 2016-10-15: 30 mL via PERINEURAL

## 2016-10-15 MED ORDER — ROCURONIUM BROMIDE 100 MG/10ML IV SOLN
INTRAVENOUS | Status: DC | PRN
  Administered 2016-10-15: 100 mg via INTRAVENOUS

## 2016-10-15 MED ORDER — MENTHOL 3 MG MT LOZG: 1.0000 | LOZENGE | OROMUCOSAL | Status: DC | PRN

## 2016-10-15 MED ORDER — METOCLOPRAMIDE HCL 5 MG/ML IJ SOLN
5.0000 mg | Freq: Three times a day (TID) | INTRAMUSCULAR | Status: DC | PRN
  Administered 2016-10-15 – 2016-10-18 (×3): 10 mg via INTRAVENOUS
  Filled 2016-10-15 (×3): qty 2

## 2016-10-15 MED ORDER — METHOCARBAMOL 1000 MG/10ML IJ SOLN
500.0000 mg | Freq: Four times a day (QID) | INTRAMUSCULAR | Status: DC | PRN
  Filled 2016-10-15: qty 5

## 2016-10-15 MED ORDER — FENTANYL CITRATE (PF) 100 MCG/2ML IJ SOLN
25.0000 ug | INTRAMUSCULAR | Status: DC | PRN
  Administered 2016-10-15 (×2): 50 ug via INTRAVENOUS

## 2016-10-15 MED ORDER — DIPHENHYDRAMINE HCL 12.5 MG/5ML PO ELIX
12.5000 mg | ORAL_SOLUTION | ORAL | Status: DC | PRN
Start: 2016-10-15 — End: 2016-10-18

## 2016-10-15 MED ORDER — TRANEXAMIC ACID 1000 MG/10ML IV SOLN
1000.0000 mg | INTRAVENOUS | Status: AC
  Administered 2016-10-15: 1000 mg via INTRAVENOUS
  Filled 2016-10-15: qty 10

## 2016-10-15 MED ORDER — MEPERIDINE HCL 25 MG/ML IJ SOLN: 6.2500 mg | INTRAMUSCULAR | Status: DC | PRN

## 2016-10-15 MED ORDER — MIDAZOLAM HCL 5 MG/5ML IJ SOLN
INTRAMUSCULAR | Status: DC | PRN
  Administered 2016-10-15: 2 mg via INTRAVENOUS

## 2016-10-15 MED ORDER — INFLUENZA VAC SPLIT QUAD 0.5 ML IM SUSY
0.5000 mL | PREFILLED_SYRINGE | INTRAMUSCULAR | Status: AC
  Administered 2016-10-16: 0.5 mL via INTRAMUSCULAR
  Filled 2016-10-15: qty 0.5

## 2016-10-15 MED ORDER — ACETAMINOPHEN 650 MG RE SUPP: 650.0000 mg | Freq: Four times a day (QID) | RECTAL | Status: DC | PRN

## 2016-10-15 MED ORDER — 0.9 % SODIUM CHLORIDE (POUR BTL) OPTIME
TOPICAL | Status: DC | PRN
  Administered 2016-10-15: 1000 mL

## 2016-10-15 MED ORDER — ONDANSETRON HCL 4 MG/2ML IJ SOLN
4.0000 mg | Freq: Once | INTRAMUSCULAR | Status: AC | PRN
  Administered 2016-10-15: 4 mg via INTRAVENOUS

## 2016-10-15 MED ORDER — OXYCODONE HCL 5 MG PO TABS
10.0000 mg | ORAL_TABLET | ORAL | Status: DC | PRN
  Administered 2016-10-15 (×2): 20 mg via ORAL
  Administered 2016-10-15: 10 mg via ORAL
  Administered 2016-10-15 – 2016-10-17 (×7): 20 mg via ORAL
  Administered 2016-10-17: 10 mg via ORAL
  Administered 2016-10-17 – 2016-10-18 (×5): 20 mg via ORAL
  Filled 2016-10-15 (×6): qty 4
  Filled 2016-10-15: qty 2
  Filled 2016-10-15 (×8): qty 4

## 2016-10-15 MED ORDER — FENTANYL CITRATE (PF) 100 MCG/2ML IJ SOLN
INTRAMUSCULAR | Status: DC | PRN
  Administered 2016-10-15 (×3): 50 ug via INTRAVENOUS
  Administered 2016-10-15: 100 ug via INTRAVENOUS
  Administered 2016-10-15 (×7): 50 ug via INTRAVENOUS
  Administered 2016-10-15: 100 ug via INTRAVENOUS
  Administered 2016-10-15: 50 ug via INTRAVENOUS

## 2016-10-15 MED ORDER — SUCCINYLCHOLINE CHLORIDE 200 MG/10ML IV SOSY
PREFILLED_SYRINGE | INTRAVENOUS | Status: AC
  Filled 2016-10-15: qty 10

## 2016-10-15 MED ORDER — DEXMEDETOMIDINE HCL IN NACL 80 MCG/20ML IV SOLN
INTRAVENOUS | Status: DC
  Filled 2016-10-15: qty 20

## 2016-10-15 MED ORDER — KETAMINE HCL 10 MG/ML IJ SOLN
INTRAMUSCULAR | Status: DC | PRN
  Administered 2016-10-15: 50 mg via INTRAVENOUS
  Administered 2016-10-15 (×2): 25 mg via INTRAVENOUS

## 2016-10-15 MED ORDER — ACETAMINOPHEN 10 MG/ML IV SOLN
INTRAVENOUS | Status: AC
  Administered 2016-10-15: 1000 mg via INTRAVENOUS
  Filled 2016-10-15: qty 100

## 2016-10-15 MED ORDER — PHENOL 1.4 % MT LIQD: 1.0000 | OROMUCOSAL | Status: DC | PRN

## 2016-10-15 MED ORDER — METAXALONE 800 MG PO TABS
800.0000 mg | ORAL_TABLET | Freq: Three times a day (TID) | ORAL | Status: DC | PRN
  Filled 2016-10-15: qty 1

## 2016-10-15 MED ORDER — PROPOFOL 10 MG/ML IV BOLUS
INTRAVENOUS | Status: AC
  Filled 2016-10-15: qty 20

## 2016-10-15 MED ORDER — CEFAZOLIN SODIUM-DEXTROSE 2-4 GM/100ML-% IV SOLN
2.0000 g | Freq: Four times a day (QID) | INTRAVENOUS | Status: AC
  Administered 2016-10-15 (×2): 2 g via INTRAVENOUS
  Filled 2016-10-15 (×2): qty 100

## 2016-10-15 MED ORDER — OXYCODONE HCL 5 MG PO TABS
ORAL_TABLET | ORAL | Status: AC
  Filled 2016-10-15: qty 2

## 2016-10-15 MED ORDER — DEXAMETHASONE SODIUM PHOSPHATE 10 MG/ML IJ SOLN
INTRAMUSCULAR | Status: DC | PRN
  Administered 2016-10-15: 10 mg via INTRAVENOUS

## 2016-10-15 MED ORDER — DOCUSATE SODIUM 100 MG PO CAPS
100.0000 mg | ORAL_CAPSULE | Freq: Two times a day (BID) | ORAL | Status: DC
  Administered 2016-10-15 – 2016-10-18 (×5): 100 mg via ORAL
  Filled 2016-10-15 (×5): qty 1

## 2016-10-15 MED ORDER — SUGAMMADEX SODIUM 200 MG/2ML IV SOLN
INTRAVENOUS | Status: DC | PRN
  Administered 2016-10-15: 210 mg via INTRAVENOUS

## 2016-10-15 MED ORDER — ALUM & MAG HYDROXIDE-SIMETH 200-200-20 MG/5ML PO SUSP
30.0000 mL | ORAL | Status: DC | PRN
  Administered 2016-10-17 – 2016-10-18 (×4): 30 mL via ORAL
  Filled 2016-10-15 (×4): qty 30

## 2016-10-15 MED ORDER — PROPOFOL 10 MG/ML IV BOLUS
INTRAVENOUS | Status: DC | PRN
  Administered 2016-10-15: 200 mg via INTRAVENOUS

## 2016-10-15 MED ORDER — LIDOCAINE 2% (20 MG/ML) 5 ML SYRINGE
INTRAMUSCULAR | Status: AC
  Filled 2016-10-15: qty 5

## 2016-10-15 MED ORDER — METHOCARBAMOL 500 MG PO TABS
ORAL_TABLET | ORAL | Status: AC
  Filled 2016-10-15: qty 1

## 2016-10-15 MED ORDER — BUPIVACAINE-EPINEPHRINE (PF) 0.5% -1:200000 IJ SOLN
INTRAMUSCULAR | Status: AC
  Filled 2016-10-15: qty 30

## 2016-10-15 SURGICAL SUPPLY — 44 items
BLADE SAW SGTL 18X1.27X75 (BLADE) ×2 IMPLANT
BLADE SAW SGTL 18X1.27X75MM (BLADE) ×1
CAPT HIP TOTAL 2 ×3 IMPLANT
CELLS DAT CNTRL 66122 CELL SVR (MISCELLANEOUS) ×1 IMPLANT
CLOSURE WOUND 1/2 X4 (GAUZE/BANDAGES/DRESSINGS) ×1
COVER PERINEAL POST (MISCELLANEOUS) ×3 IMPLANT
COVER SURGICAL LIGHT HANDLE (MISCELLANEOUS) ×3 IMPLANT
DRAPE C-ARM 42X72 X-RAY (DRAPES) ×3 IMPLANT
DRAPE STERI IOBAN 125X83 (DRAPES) ×3 IMPLANT
DRAPE U-SHAPE 47X51 STRL (DRAPES) ×9 IMPLANT
DRSG AQUACEL AG ADV 3.5X10 (GAUZE/BANDAGES/DRESSINGS) ×3 IMPLANT
DURAPREP 26ML APPLICATOR (WOUND CARE) ×3 IMPLANT
ELECT BLADE 4.0 EZ CLEAN MEGAD (MISCELLANEOUS)
ELECT REM PT RETURN 9FT ADLT (ELECTROSURGICAL) ×3
ELECTRODE BLDE 4.0 EZ CLN MEGD (MISCELLANEOUS) IMPLANT
ELECTRODE REM PT RTRN 9FT ADLT (ELECTROSURGICAL) ×1 IMPLANT
FACESHIELD WRAPAROUND (MASK) ×9 IMPLANT
GLOVE BIO SURGEON STRL SZ8 (GLOVE) ×6 IMPLANT
GLOVE BIOGEL PI IND STRL 8 (GLOVE) ×2 IMPLANT
GLOVE BIOGEL PI INDICATOR 8 (GLOVE) ×4
GOWN STRL REUS W/ TWL LRG LVL3 (GOWN DISPOSABLE) ×1 IMPLANT
GOWN STRL REUS W/ TWL XL LVL3 (GOWN DISPOSABLE) ×2 IMPLANT
GOWN STRL REUS W/TWL LRG LVL3 (GOWN DISPOSABLE) ×2
GOWN STRL REUS W/TWL XL LVL3 (GOWN DISPOSABLE) ×4
KIT BASIN OR (CUSTOM PROCEDURE TRAY) ×3 IMPLANT
KIT ROOM TURNOVER OR (KITS) ×3 IMPLANT
MANIFOLD NEPTUNE II (INSTRUMENTS) ×3 IMPLANT
NEEDLE HYPO 22GX1.5 SAFETY (NEEDLE) ×3 IMPLANT
NS IRRIG 1000ML POUR BTL (IV SOLUTION) ×3 IMPLANT
PACK TOTAL JOINT (CUSTOM PROCEDURE TRAY) ×3 IMPLANT
PAD ARMBOARD 7.5X6 YLW CONV (MISCELLANEOUS) ×3 IMPLANT
RTRCTR WOUND ALEXIS 18CM MED (MISCELLANEOUS) ×3
STRIP CLOSURE SKIN 1/2X4 (GAUZE/BANDAGES/DRESSINGS) ×2 IMPLANT
SUT ETHIBOND NAB CT1 #1 30IN (SUTURE) ×6 IMPLANT
SUT MNCRL AB 3-0 PS2 27 (SUTURE) ×3 IMPLANT
SUT VIC AB 1 CT1 27 (SUTURE) ×2
SUT VIC AB 1 CT1 27XBRD ANBCTR (SUTURE) ×1 IMPLANT
SUT VIC AB 2-0 CT1 27 (SUTURE) ×2
SUT VIC AB 2-0 CT1 TAPERPNT 27 (SUTURE) ×1 IMPLANT
SUT VLOC 180 0 24IN GS25 (SUTURE) ×3 IMPLANT
SYR 50ML LL SCALE MARK (SYRINGE) ×3 IMPLANT
TOWEL OR 17X24 6PK STRL BLUE (TOWEL DISPOSABLE) ×3 IMPLANT
TOWEL OR 17X26 10 PK STRL BLUE (TOWEL DISPOSABLE) ×3 IMPLANT
TRAY CATH 16FR W/PLASTIC CATH (SET/KITS/TRAYS/PACK) IMPLANT

## 2016-10-15 NOTE — Anesthesia Postprocedure Evaluation (Signed)
Anesthesia Post Note  Patient: MONNIE GUDGEL  Procedure(s) Performed: TOTAL HIP ARTHROPLASTY ANTERIOR APPROACH (Right )     Patient location during evaluation: PACU Anesthesia Type: Spinal Level of consciousness: oriented and awake and alert Pain management: pain level controlled Vital Signs Assessment: post-procedure vital signs reviewed and stable Respiratory status: spontaneous breathing, respiratory function stable and patient connected to nasal cannula oxygen Cardiovascular status: blood pressure returned to baseline and stable Postop Assessment: no headache, no backache and no apparent nausea or vomiting Anesthetic complications: no    Last Vitals:  Vitals:   10/15/16 1105 10/15/16 1120  BP: 130/69 (!) 142/66  Pulse: 65 71  Resp: 14 16  Temp: (!) 36.4 C   SpO2: 100% 100%    Last Pain:  Vitals:   10/15/16 1200  TempSrc:   PainSc: 10-Worst pain ever                 Fallen Crisostomo

## 2016-10-15 NOTE — Op Note (Signed)
PRE-OP DIAGNOSIS:  RIGHT HIP DEGENERATIVE JOINT DISEASE POST-OP DIAGNOSIS:  same PROCEDURE: RIGHT TOTAL HIP ARTHROPLASTY ANTERIOR APPROACH ANESTHESIA:  General SURGEON:  Melrose Nakayama MD ASSISTANT:  Loni Dolly PA-C   INDICATIONS FOR PROCEDURE:  The patient is a 61 y.o. female with a long history of a painful hip.  This has persisted despite multiple conservative measures.  The patient has persisted with pain and dysfunction making rest and activity difficult.  A total hip replacement is offered as surgical treatment.  Informed operative consent was obtained after discussion of possible complications including reaction to anesthesia, infection, neurovascular injury, dislocation, DVT, PE, and death.  The importance of the postoperative rehab program to optimize result was stressed with the patient.  SUMMARY OF FINDINGS AND PROCEDURE:  Under general anesthesia through a anterior approach an the Hana table a right THR was performed.  The patient had severe degenerative change and excellent bone quality.  We used DePuy components to replace the hip and these were size KA 11 Corail femur capped with a +1 27mm ceramic hip ball.  On the acetabular side we used a size 50 Gription shell with a  plus 4 neutral polyethylene liner.  We did use a hole eliminator.  Loni Dolly PA-C assisted throughout and was invaluable to the completion of the case in that he helped position and retract while I performed the procedure.  He also closed simultaneously to help minimize OR time.  I used fluoroscopy throughout the case to check position of implants and leg lengths and read all of these views myself.  DESCRIPTION OF PROCEDURE:  The patient was taken to the OR suite where general anesthetic was applied.  The patient was then positioned on the Hana table supine.  All bony prominences were appropriately padded.  Prep and drape was then performed in normal sterile fashion.  The patient was given kefzol preoperative antibiotic  and an appropriate time out was performed.  We then took an anterior approach to the right hip.  Dissection was taken through adipose to the tensor fascia lata fascia.  This structure was incised longitudinally and we dissected in the intermuscular interval just medial to this muscle.  Cobra retractors were placed superior and inferior to the femoral neck superficial to the capsule.  A capsular incision was then made and the retractors were placed along the femoral neck.  Xray was brought in to get a good level for the femoral neck cut which was made with an oscillating saw and osteotome.  The femoral head was removed with a corkscrew.  The acetabulum was exposed and some labral tissues were excised. Reaming was taken to the inside wall of the pelvis and sequentially up to 1 mm smaller than the actual component.  A trial of components was done and then the aforementioned acetabular shell was placed in appropriate tilt and anteversion confirmed by fluoroscopy. The liner was placed along with the hole eliminator and attention was turned to the femur.  The leg was brought down and over into adduction and the elevator bar was used to raise the femur up gently in the wound.  The piriformis was released with care taken to preserve the obturator internus attachment and all of the posterior capsule. The femur was reamed and then broached to the appropriate size.  A trial reduction was done and the aforementioned head and neck assembly gave [place] the best stability in extension with external rotation.  Leg lengths were felt to be about equal by fluoroscopic exam.  The trial components were removed and the wound irrigated.  We then placed the femoral component in appropriate anteversion.  The head was applied to a dry stem neck and the hip again reduced.  It was again stable in the aforementioned position.  The would was irrigated again followed by re-approximation of anterior capsule with ethibond suture. Tensor fascia was  repaired with V-loc suture  followed by deep closure with #O and #2 undyed vicryl.  Skin was closed with subQ stitch and steristrips followed by a sterile dressing.  EBL and IOF can be obtained from anesthesia records.  DISPOSITION:  The patient was extubated in the OR and taken to PACU in stable condition to be admitted to the Orthopedic Surgery for appropriate post-op care to include perioperative antibiotics and DVT prophylaxis.

## 2016-10-15 NOTE — Anesthesia Procedure Notes (Signed)
Spinal  Patient location during procedure: OR Start time: 10/15/2016 7:35 AM End time: 10/15/2016 7:45 AM Staffing Anesthesiologist: Sakai Heinle Preanesthetic Checklist Completed: patient identified, site marked, surgical consent, pre-op evaluation, timeout performed, IV checked, risks and benefits discussed and monitors and equipment checked Spinal Block Patient position: sitting Prep: DuraPrep Patient monitoring: heart rate, cardiac monitor, continuous pulse ox and blood pressure Approach: midline Location: L3-4 Injection technique: single-shot Needle Needle type: Sprotte  Needle gauge: 24 G Needle length: 9 cm Assessment Sensory level: T4 Additional Notes FAILED SPINAL AT L3-4 AND L4-5 [place] FOR GAET

## 2016-10-15 NOTE — Evaluation (Signed)
Physical Therapy Evaluation Patient Details Name: Sherri [name] MRN: [id number] DOB: 1955-08-31 Today's Date: 10/15/2016   History of Present Illness  Pt is a 61 y/o female s/p elective R THA, direct anterior approach. PMH inlcudes anxiety, breast cancer s/p lumpectomy, HTN, L ankle ORIF, and lumbar surgery.   Clinical Impression  Pt is s/p surgery above with deficits below. PTA, pt was independent with functional mobility. Upon eval, pt VERY limited by pain. Only able to perform stand pivot to [facility] and then to chair secondary to pain. Min guard to min A for mobility this session. Pt nauseous following mobility and wanting pain meds so RN notified. Reports she will be going to her friends house after d/c, but is concerned secondary to pain she was in this session. Educated to discuss concerns with MD. Will continue to follow acutely to maximize functional mobility independence and safety.     Follow Up Recommendations DC plan and follow up therapy as arranged by surgeon;Supervision for mobility/OOB    Equipment Recommendations  None recommended by PT (has all necessary DME )    Recommendations for Other Services       Precautions / Restrictions Precautions Precautions: None Precaution Comments: Reviewed THA handout with pt. Very limited secondary to pain and nausea.  Restrictions Weight Bearing Restrictions: Yes RLE Weight Bearing: Weight bearing as tolerated      Mobility  Bed Mobility Overal bed mobility: Needs Assistance Bed Mobility: Supine to Sit     Supine to sit: Min assist     General bed mobility comments: Min A for RLE management during bed mobility.   Transfers Overall transfer level: Needs assistance Equipment used: Rolling walker (2 wheeled) Transfers: Sit to/from [facility] Sit to Stand: Min guard Stand pivot transfers: Min guard       General transfer comment: Min guard for steadying throughout standing and transfer. Only able to  perform stand pivot to [facility] and from [facility] to chair secondary to pain and nausea. Verbal cues for sequencing with RW.   Ambulation/Gait             General Gait Details: Deferred secondary to increased pain  Stairs            Wheelchair Mobility    Modified Rankin (Stroke Patients Only)       Balance Overall balance assessment: Needs assistance Sitting-balance support: No upper extremity supported;Feet supported Sitting balance-Leahy Scale: Good     Standing balance support: Bilateral upper extremity supported;During functional activity Standing balance-Leahy Scale: Poor Standing balance comment: Required BUE on RW for mobility.                              Pertinent Vitals/Pain Pain Assessment: 0-10 Pain Score: 8  Pain Location: R hip  Pain Descriptors / Indicators: Aching;Operative site guarding Pain Intervention(s): Limited activity within patient's tolerance;Monitored during session;Repositioned;Patient requesting pain meds-RN notified;RN gave pain meds during session    Home Living Family/patient expects to be discharged to:: Unsure Living Arrangements: Alone Available Help at Discharge: Friend(s);Available 24 hours/day Type of Home: Mobile home Home Access: Stairs to enter Entrance Stairs-Rails: Right;Left Entrance Stairs-Number of Steps: 5 Home Layout: One level Home Equipment: Walker - 2 wheels;Bedside commode Additional Comments: Pt reports she will be staying with her friend at d/c.     Prior Function Level of Independence: Independent               Hand  Dominance   Dominant Hand: Right    Extremity/Trunk Assessment   Upper Extremity Assessment Upper Extremity Assessment: Overall WFL for tasks assessed    Lower Extremity Assessment Lower Extremity Assessment: RLE deficits/detail RLE Deficits / Details: Numbness at incision site. Deficits consistent with post op pain and weakness. Limited tolerance for ther ex this session.      Cervical / Trunk Assessment Cervical / Trunk Assessment: Normal  Communication   Communication: No difficulties  Cognition Arousal/Alertness: Awake/alert Behavior During Therapy: WFL for tasks assessed/performed Overall Cognitive Status: Within Functional Limits for tasks assessed                                        General Comments      Exercises Total Joint Exercises Ankle Circles/Pumps: AROM;Both;20 reps Quad Sets: AROM;Right;10 reps   Assessment/Plan    PT Assessment Patient needs continued PT services  PT Problem List Decreased strength;Decreased range of motion;Decreased activity tolerance;Decreased balance;Decreased mobility;Decreased knowledge of use of DME;Decreased knowledge of precautions;Pain       PT Treatment Interventions DME instruction;Gait training;Stair training;Functional mobility training;Therapeutic activities;Therapeutic exercise;Balance training;Neuromuscular re-education;Patient/family education    PT Goals (Current goals can be found in the Care Plan section)  Acute Rehab PT Goals Patient Stated Goal: to decrease pain  PT Goal Formulation: With patient Time For Goal Achievement: 10/22/16 Potential to Achieve Goals: Good    Frequency 7X/week   Barriers to discharge        Co-evaluation               AM-PAC PT "6 Clicks" Daily Activity  Outcome Measure Difficulty turning over in bed (including adjusting bedclothes, sheets and blankets)?: A Little Difficulty moving from lying on back to sitting on the side of the bed? : Unable Difficulty sitting down on and standing up from a chair with arms (e.g., wheelchair, bedside commode, etc,.)?: Unable Help needed moving to and from a bed to chair (including a wheelchair)?: A Little Help needed walking in hospital room?: A Little Help needed climbing 3-5 steps with a railing? : A Lot 6 Click Score: 13    End of Session   Activity Tolerance: Patient limited by  pain Patient left: in chair;with call bell/phone within reach Nurse Communication: Mobility status PT Visit Diagnosis: Other abnormalities of gait and mobility (R26.89);Pain Pain - Right/Left: Right Pain - part of body: Hip    Time: 0349-1791 PT Time Calculation (min) (ACUTE ONLY): 26 min   Charges:   PT Evaluation $PT Eval Low Complexity: 1 Low PT Treatments $Therapeutic Activity: 8-22 mins   PT G Codes:        Leighton Ruff, PT, DPT  Acute Rehabilitation Services  Pager: [phone]   Sherri [name] 10/15/2016, 3:10 PM

## 2016-10-15 NOTE — Transfer of Care (Signed)
Immediate Anesthesia Transfer of Care Note  Patient: Sherri [name]  Procedure(s) Performed: TOTAL HIP ARTHROPLASTY ANTERIOR APPROACH (Right )  Patient Location: PACU  Anesthesia Type:General  Level of Consciousness: awake and patient cooperative  Airway & Oxygen Therapy: Patient Spontanous Breathing and Patient connected to face mask oxygen  Post-op Assessment: Report given to RN and Post -op Vital signs reviewed and stable  Post vital signs: Reviewed and stable  Last Vitals:  Vitals:   10/15/16 0542 10/15/16 0545  BP:  (!) 168/70  Pulse: 85   Resp:  18  Temp: 37 C   SpO2:  100%    Last Pain:  Vitals:   10/15/16 0554  TempSrc:   PainSc: 10-Worst pain ever      Patients Stated Pain Goal: 3 (83/72/90 2111)  Complications: No apparent anesthesia complications

## 2016-10-15 NOTE — Progress Notes (Signed)
Pt screaming and hollering hurt hurt hurt cannot calm pt pt has been medicated bu crna dr Sammuel Hines called and at bedside prececdix 20mg  iv given by dr Gypsy Balsam pt sleeping after med given o2 saTS 97-100 ON FACE MASK

## 2016-10-15 NOTE — Progress Notes (Signed)
REPORT GIVEN TO STEVE SHAW RN AS CAREGIVER

## 2016-10-15 NOTE — Anesthesia Procedure Notes (Addendum)
Procedure Name: Intubation Date/Time: 10/15/2016 7:47 AM Performed by: Lance Coon Pre-anesthesia Checklist: Patient identified, Emergency Drugs available, Suction available, Patient being monitored and Timeout performed Patient Re-evaluated:Patient Re-evaluated prior to induction Oxygen Delivery Method: Circle system utilized Preoxygenation: Pre-oxygenation with 100% oxygen Induction Type: IV induction Ventilation: Mask ventilation without difficulty Laryngoscope Size: Miller and 3 Grade View: Grade I Tube type: Oral Tube size: 7.0 mm Number of attempts: 1 Airway Equipment and Method: Stylet Placement Confirmation: ETT inserted through vocal cords under direct vision,  positive ETCO2 and breath sounds checked- equal and bilateral Secured at: 21 cm Tube secured with: Tape Dental Injury: Teeth and Oropharynx as per pre-operative assessment

## 2016-10-15 NOTE — Interval H&P Note (Signed)
History and Physical Interval Note:  10/15/2016 7:19 AM  Sherri [name]  has presented today for surgery, with the diagnosis of RIGHT HIP DEGENERATIVE JOINT DISEASE  The various methods of treatment have been discussed with the patient and family. After consideration of risks, benefits and other options for treatment, the patient has consented to  Procedure(s): TOTAL HIP ARTHROPLASTY ANTERIOR APPROACH (Right) as a surgical intervention .  The patient's history has been reviewed, patient examined, no change in status, stable for surgery.  I have reviewed the patient's chart and labs.  Questions were answered to the patient's satisfaction.     Sherri [name] G

## 2016-10-16 ENCOUNTER — Encounter (HOSPITAL_COMMUNITY): Payer: Self-pay | Admitting: Orthopaedic Surgery

## 2016-10-16 MED ORDER — BUTALBITAL-APAP-CAFFEINE 50-325-40 MG PO TABS: 1.0000 | ORAL_TABLET | ORAL | Status: DC | PRN

## 2016-10-16 NOTE — Progress Notes (Signed)
Subjective: 1 Day Post-Op Procedure(s) (LRB): TOTAL HIP ARTHROPLASTY ANTERIOR APPROACH (Right)  Patient is doing ok. She is in pain but biggest problem is a migraine. She is asking for her home migraine med.  Activity level:  wbat Diet tolerance:  ok Voiding:  ok Patient reports pain as moderate.    Objective: Vital signs in last 24 hours: Temp:  [97.5 F (36.4 C)-100.4 F (38 C)] 100.4 F (38 C) (10/10 0500) Pulse Rate:  [65-92] 89 (10/09 2040) Resp:  [11-19] 16 (10/10 0500) BP: (127-153)/(60-93) 135/62 (10/10 0500) SpO2:  [96 %-100 %] 100 % (10/10 0000)  Labs: No results for input(s): HGB in the last 72 hours. No results for input(s): WBC, RBC, HCT, PLT in the last 72 hours. No results for input(s): NA, K, CL, CO2, BUN, CREATININE, GLUCOSE, CALCIUM in the last 72 hours. No results for input(s): LABPT, INR in the last 72 hours.  Physical Exam:  Neurologically intact ABD soft Neurovascular intact Sensation intact distally Intact pulses distally Dorsiflexion/Plantar flexion intact Incision: dressing C/D/I and no drainage No cellulitis present Compartment soft  Assessment/Plan:  1 Day Post-Op Procedure(s) (LRB): TOTAL HIP ARTHROPLASTY ANTERIOR APPROACH (Right) Advance diet Up with therapy Plan for discharge tomorrow Discharge home with home health if doing well and cleared by PT. If not then Friday. Continue on ASA 325mg  BID x 4 weeks. Follow up in office 2 weeks post op. Home migraine meds given.  Sherri [name], Larwance Sachs 10/16/2016, 8:07 AM

## 2016-10-16 NOTE — NC FL2 (Signed)
[place] MEDICAID FL2 LEVEL OF CARE SCREENING TOOL     IDENTIFICATION  Patient Name: Sherri [name] Birthdate: [birth year] Sex: female Admission Date (Current Location): 10/15/2016  [facility] and [place] Number:  Herbalist and Address:  [facility], [place] [address], [place], [place] [address]      Provider Number: [id number]  Attending Physician Name and Address:  Melrose Nakayama, MD  Relative Name and Phone Number:  Melina Schools, sister, [phone]    Current Level of Care: Hospital Recommended Level of Care: [place] Prior Approval Number:    Date Approved/Denied: 10/16/16 PASRR Number: [id number]  Discharge Plan: SNF    Current Diagnoses: Patient Active Problem List   Diagnosis Date Noted  . Primary localized osteoarthritis of right hip 10/15/2016  . Primary osteoarthritis of right hip 10/15/2016  . Chest pain 12/19/2015  . Closed left ankle fracture 12/17/2015  . Intractable migraine without aura and without status migrainosus 09/23/2013  . Essential hypertension 09/23/2013  . Obesity, unspecified 09/23/2013  . Migraine 02/14/2011  . Obesity 02/14/2011  . 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 Blindness of right eye 02/14/2011  . Depression 02/14/2011    Orientation RESPIRATION BLADDER Height & Weight     Self, Time, Situation, Place  O2 (Nasal Cannula) Continent Weight: 227 lb (103 kg) Height:  5' 8.5" (174 cm)  BEHAVIORAL SYMPTOMS/MOOD NEUROLOGICAL BOWEL NUTRITION STATUS      Continent Diet  AMBULATORY STATUS COMMUNICATION OF NEEDS Skin   Limited Assist Verbally Surgical wounds (Right Hip Silver Hydrofiber)                       Personal Care Assistance Level of Assistance  Bathing, Feeding, Dressing Bathing Assistance: Limited assistance Feeding assistance: Independent Dressing Assistance: Limited assistance     Functional Limitations Info  Sight Sight Info: Impaired       SPECIAL CARE FACTORS FREQUENCY  PT (By licensed PT)     PT Frequency: 7x week              Contractures      Additional Factors Info  Code Status, Allergies Code Status Info: Full code Allergies Info: EFFEXOR VENLAFAXINE HYDROCHLORIDE, IODINE, SHELLFISH ALLERGY, CODEINE, FLEXERIL CYCLOBENZAPRINE HCL, HYDROMORPHONE, NUBAIN NALBUPHINE HCL, ERYTHROMYCIN            Current Medications (10/16/2016):  This is the current hospital active medication list Current Facility-Administered Medications  Medication Dose Route Frequency Provider Last Rate Last Dose  . acetaminophen (TYLENOL) tablet 650 mg  650 mg Oral Q6H PRN Loni Dolly, PA-C   650 mg at 10/15/16 1748   Or  . acetaminophen (TYLENOL) suppository 650 mg  650 mg Rectal Q6H PRN Loni Dolly, PA-C      . alum & mag hydroxide-simeth (MAALOX/MYLANTA) 200-200-20 MG/5ML suspension 30 mL  30 mL Oral Q4H PRN Loni Dolly, PA-C      . aspirin EC tablet 325 mg  325 mg Oral BID PC Loni Dolly, PA-C   325 mg at 10/16/16 1703  . bisacodyl (DULCOLAX) EC tablet 5 mg  5 mg Oral Daily PRN Loni Dolly, PA-C      . butalbital-acetaminophen-caffeine (FIORICET, ESGIC) 50-325-40 MG per tablet 1 tablet  1 tablet Oral Q4H PRN Loni Dolly, PA-C   1 tablet at 10/16/16 1456  . diphenhydrAMINE (BENADRYL) 12.5 MG/5ML elixir 12.5-25 mg  12.5-25 mg Oral Q4H PRN Loni Dolly, PA-C      .  docusate sodium (COLACE) capsule 100 mg  100 mg Oral BID Loni Dolly, PA-C   100 mg at 10/16/16 9191  . lactated ringers infusion   Intravenous Continuous Loni Dolly, PA-C 75 mL/hr at 10/15/16 2304    . menthol-cetylpyridinium (CEPACOL) lozenge 3 mg  1 lozenge Oral PRN Loni Dolly, PA-C       Or  . phenol (CHLORASEPTIC) mouth spray 1 spray  1 spray Mouth/Throat PRN Loni Dolly, PA-C      . metaxalone (SKELAXIN) tablet 800 mg  800 mg Oral TID PRN Loni Dolly, PA-C      . methocarbamol (ROBAXIN) tablet 500 mg  500 mg Oral Q6H PRN Loni Dolly, PA-C   500 mg at  10/16/16 0914   Or  . methocarbamol (ROBAXIN) 500 mg in dextrose 5 % 50 mL IVPB  500 mg Intravenous Q6H PRN Loni Dolly, PA-C      . metoCLOPramide (REGLAN) tablet 5-10 mg  5-10 mg Oral Q8H PRN Loni Dolly, PA-C       Or  . metoCLOPramide (REGLAN) injection 5-10 mg  5-10 mg Intravenous Q8H PRN Loni Dolly, PA-C   10 mg at 10/15/16 2300  . morphine 4 MG/ML injection 1-2 mg  1-2 mg Intravenous Q3H PRN Melrose Nakayama, MD   2 mg at 10/16/16 1420  . ondansetron (ZOFRAN) tablet 4 mg  4 mg Oral Q6H PRN Loni Dolly, PA-C       Or  . ondansetron (ZOFRAN) injection 4 mg  4 mg Intravenous Q6H PRN Loni Dolly, PA-C   4 mg at 10/16/16 1444  . oxyCODONE (Oxy IR/ROXICODONE) immediate release tablet 10-20 mg  10-20 mg Oral Q4H PRN Loni Dolly, PA-C   20 mg at 10/16/16 1329     Discharge Medications: Please see discharge summary for a list of discharge medications.  Relevant Imaging Results:  Relevant Lab Results:   Additional Information SS#:[id number] [place], LCSW

## 2016-10-16 NOTE — Clinical Social Work Note (Signed)
Clinical Social Work Assessment  Patient Details  Name: Sherri [name] MRN: [id number] Date of Birth: [birth year]  Date of referral:  10/16/16               Reason for consult:  Facility Placement                Permission sought to share information with:  Case Manager Permission granted to share information::  Yes, Verbal Permission Granted  Name::     Melina Schools, [phone]  Agency::  SNF  Relationship::  sister  Contact Information:     Housing/Transportation Living arrangements for the past 2 months:  Single Family Home Source of Information:  Patient Patient Interpreter Needed:  None Criminal Activity/Legal Involvement Pertinent to Current Situation/Hospitalization:  No - Comment as needed Significant Relationships:  Adult Children, Siblings, Other Family Members Lives with:  Self Do you feel safe going back to the place where you live?  No Need for family participation in patient care:  No (Coment)  Care giving concerns:  Pt is employed full time as a Artist. Pt resides alone and is a widow. She has some family in the local area and other families in [place]. Pt will apply for short term disability during her short term rehab time. Pt has no one to care for her at home and is amenable to short term rehab.  Social Worker assessment / plan:  CSW discussed clinical teams recommendation for short term rehab. CSW explained the SNF process and options. CSW discussed insurance auth by [employer]. Pt concerned about Insurance coverage for SNF. CSW indicated that once SNF offers and she selects a bed, the SNF will have access to insurance plan and initiate insurance auth. They would communicate with CSW on any concerns or things patient shld be aware of.  CSW discussed the transportation process. PT has never experienced SNF before. CSW validated and explained all. CSW obtained permission to send to  area.   Employment status:  Other (Comment) (Short term disabilty due  to new impairment) Insurance information:  Other (Comment Required) ([place]) PT Recommendations:    Information / Referral to community resources:  [place]  Patient/Family's Response to care:  Patient appreciative of CSW assistance with SNF placement. No issues or concerns identified at this time.  Patient/Family's Understanding of and Emotional Response to Diagnosis, Current Treatment, and Prognosis:  Patient has good understanding of diagnosis, current treatment and prognosis. Pt hopeful that she will get back to work soon and impairment will be much improved. No other issues or concerns identified at this time.  Emotional Assessment Appearance:  Appears stated age Attitude/Demeanor/Rapport:   (Cooperative) Affect (typically observed):  Accepting, Appropriate Orientation:  Oriented to Self, Oriented to Place, Oriented to  Time, Oriented to Situation Alcohol / Substance use:  Not Applicable Psych involvement (Current and /or in the community):  No (Comment)  Discharge Needs  Concerns to be addressed:  Care Coordination Readmission within the last 30 days:  No Current discharge risk:  Dependent with Mobility, Physical Impairment, Lives alone Barriers to Discharge:  No Barriers Identified   Normajean Baxter, LCSW 10/16/2016, 5:13 PM

## 2016-10-16 NOTE — Progress Notes (Addendum)
While rounding, follow up on patient:  Patient stated that she has " throbbing/aching sensation in her chest/shoulder/left arm/under left breast, she also stated that she had burning upon urination and that she had some left flank pain as well.   I asked the patient if she her overall pain management was adequte and she said that it was. I asked if she would like to try some heat packs for her throbbing/aching discomfort around chest/shoulder/left arm/under left breast/between shoulder blades, she said she would like me to apply some heat. I applied heat to those areas and she stated she felt better.   Patient is anxious and slightly emotional at times, does not appear like she has been sleeping.    EKG from earlier was normal, patient is alert, follow commands, skin is warm and dry, VSS are stable, on continuous pulse oxy, lung and heart sounds normal.   Plan: Primary RN to call ORTHO on call: - update on patient's status - suggest sleep aid or anti-anxiety medications (Patient + depression but not on meds, was emotional about talking about her husband passing away a few years ago).  - suggest UA or UC, patient did have a intra-op foley.   Called at 0100 for an update, patient was resting comfortably.   Call RRT if needed   End Time 2300

## 2016-10-16 NOTE — Progress Notes (Signed)
Physical Therapy Treatment Patient Details Name: Sherri [name] MRN: [id number] DOB: [birth year] Today's Date: 10/16/2016    History of Present Illness Pt is a 61 y/o female s/p elective R THA, direct anterior approach. PMH inlcudes anxiety, breast cancer s/p lumpectomy, HTN, L ankle ORIF, and lumbar surgery.     PT Comments    Patient continues to be limited with mobility due to pain.  Limited therex due to pain and tension in her face with h/o migraine earlier today.  Did progress to ambulation this session, but unable to get out of the room and did not tolerate up in chair more than 1.5 hours.  Feel she will benefit from SNF level rehab at d/c due to living alone and unable to manage mobility on her own safely.  PT to follow acutely.    Follow Up Recommendations  Supervision for mobility/OOB;SNF     Equipment Recommendations  None recommended by PT    Recommendations for Other Services       Precautions / Restrictions Precautions Precautions: None Restrictions RLE Weight Bearing: Weight bearing as tolerated    Mobility  Bed Mobility Overal bed mobility: Needs Assistance Bed Mobility: Sit to Supine       Sit to supine: Min assist   General bed mobility comments: assist for R LE back into bed, cues for technique  Transfers Overall transfer level: Needs assistance Equipment used: Rolling walker (2 wheeled) Transfers: Sit to/from Stand Sit to Stand: Min assist         General transfer comment: up from recliner and from 3:1 over toilet with cues for R LE positioning with stand to sit and walker safety  Ambulation/Gait Ambulation/Gait assistance: Min guard Ambulation Distance (Feet): 22 Feet Assistive device: Rolling walker (2 wheeled) Gait Pattern/deviations: Step-to pattern;Decreased stride length;Antalgic;Decreased weight shift to right;Trunk flexed     General Gait Details: pain with in room ambulation and limited due to pain   Stairs             Wheelchair Mobility    Modified Rankin (Stroke Patients Only)       Balance Overall balance assessment: Needs assistance Sitting-balance support: Feet supported Sitting balance-Leahy Scale: Fair Sitting balance - Comments: sitting at EOB   Standing balance support: Bilateral upper extremity supported;Single extremity supported;During functional activity Standing balance-Leahy Scale: Poor Standing balance comment: UE support at least one hand or on elbows when washing and drying hands and during toilet hygiene after toileting                            Cognition Arousal/Alertness: Awake/alert Behavior During Therapy: WFL for tasks assessed/performed Overall Cognitive Status: Within Functional Limits for tasks assessed                                        Exercises Total Joint Exercises Ankle Circles/Pumps: AROM;Both;Supine;10 reps Quad Sets: AROM;Supine;Both;5 reps Short Arc Quad: AROM;Right;5 reps;Supine Heel Slides: AROM;AAROM;Right;5 reps;Supine Hip ABduction/ADduction: AAROM;AROM;Right;5 reps;Supine    General Comments        Pertinent Vitals/Pain Pain Score: 8  Pain Location: R hip and L chest Pain Descriptors / Indicators: Aching;Operative site guarding;Grimacing;Headache Pain Intervention(s): Monitored during session;Repositioned;Limited activity within patient's tolerance;Ice applied;RN gave pain meds during session    Home Living  Prior Function            PT Goals (current goals can now be found in the care plan section) Progress towards PT goals: Progressing toward goals    Frequency    7X/week      PT Plan Discharge plan needs to be updated    Co-evaluation              AM-PAC PT "6 Clicks" Daily Activity  Outcome Measure  Difficulty turning over in bed (including adjusting bedclothes, sheets and blankets)?: A Little Difficulty moving from lying on back to sitting on the  side of the bed? : Unable Difficulty sitting down on and standing up from a chair with arms (e.g., wheelchair, bedside commode, etc,.)?: Unable Help needed moving to and from a bed to chair (including a wheelchair)?: A Little Help needed walking in hospital room?: A Little Help needed climbing 3-5 steps with a railing? : A Lot 6 Click Score: 13    End of Session Equipment Utilized During Treatment: Gait belt Activity Tolerance: Patient limited by pain Patient left: in bed;with call bell/phone within reach   PT Visit Diagnosis: Pain;Difficulty in walking, not elsewhere classified (R26.2);Unsteadiness on feet (R26.81) Pain - Right/Left: Right Pain - part of body: Hip     Time: 7619-5093 PT Time Calculation (min) (ACUTE ONLY): 25 min  Charges:  $Gait Training: 8-22 mins $Therapeutic Exercise: 8-22 mins                    G CodesMagda [name], [place] [phone] 10/16/2016    Sherri [name] 10/16/2016, 2:07 PM

## 2016-10-16 NOTE — Progress Notes (Signed)
Pt complained of sharp shooting chest pain radiating to her left arm, jaw and neck. VS taken, called rapid response, administered oxygen at 2L/min via nasal cannula, EKG done and MD notified. Administered 2mg  morphine IV, gave zofran IV for nausea. Pt further complained of migraine, administered PRN fioricet. Checked Pt's condition after 30 mins, verbalized that she felt relief from pain and is feeling better, will continue to monitor.

## 2016-10-16 NOTE — Progress Notes (Signed)
PT Cancellation Note  Patient Details Name: Sherri [name] MRN: [id number] DOB: [birth year]   Cancelled Treatment:    Reason Eval/Treat Not Completed: Pain limiting ability to participate; patient in tears with migraine and hip pain.  Reports just received medications.  Unable to tolerate therapy at this time.  Will attempt later when able to tolerate.    Reginia Naas 10/16/2016, 9:21 AM  Magda Kiel, PT [phone] 10/16/2016

## 2016-10-16 NOTE — Significant Event (Signed)
Rapid Response Event Note  Overview:  Called to bedside for CP Time Called: 1409 Arrival Time: 5465 Event Type: Cardiac  Initial Focused Assessment:  Called to bedside for chest pain, 8/10 that started around 1pm and patient states got worse and could not work with therapy.  On my  Arrival to patients room, patient lying in bed, on nasal cannula -EKG being obtained.  Patient states pain is on left side of chest, radiates up her jaw and down her left arm and around her left breast as well as having sharp pain on inspiration.  Patient also states she has had a migraine all day. Skin is warm and dry.  Vitals-159/64, HR84, RR18, SPO2 100% on 2LPm.     Interventions:  RN gave patient morphine 2 mg IV.  Pain is reproducible on left chest wall when palpated.  Patient states minimal relief with the morphine and still rates pain as 8/10. Offered SL nitro as per protocl, patient has refused this at this time  Plan of Care (if not transferred):  Recommend calling MD and update on event.  Md will come to bedside to see patient  Event Summary:  RN to monitor and call if assistance needed   at      at          [facility], Harlin Rain

## 2016-10-17 MED ORDER — MELOXICAM 7.5 MG PO TABS
15.0000 mg | ORAL_TABLET | Freq: Every day | ORAL | Status: DC
  Administered 2016-10-17 – 2016-10-18 (×2): 15 mg via ORAL
  Filled 2016-10-17 (×2): qty 2

## 2016-10-17 NOTE — Social Work (Signed)
CSW discussed with patient barriers with SNF offers as many of the SNF's are not in network with her [place]. CSW advsied patient and encouraged her to call her insurance company to find out who is in network with them for short term rehab. CSW received SNF denials from: [place], [place], [place], [place], [facility], [place], [place], [place] as SNF not in network with [employer].    CSW will continue to follow up.  Elissa Hefty, LCSW Clinical Social Worker [phone]

## 2016-10-17 NOTE — Progress Notes (Signed)
At beginning of shift change patient again started to complain of chest discomfort radiating to left arm. Patient in no visual acute distress and all vitals with assessments were within normal limits. Prn muscle relaxer, pain and migrane medication given. Rapid response nurse came alittle after for her rounds and applied heat packs. Upon reassessment patient noted to be sleeping comfortable. Charge nurse aware of issues. Will continue to monitor patient and any changes.

## 2016-10-17 NOTE — Progress Notes (Signed)
Physical Therapy Treatment Patient Details Name: Sherri [name] MRN: [id number] DOB: August 07, 1955 Today's Date: 10/17/2016    History of Present Illness Pt is a 61 y/o female s/p elective R THA, direct anterior approach. PMH inlcudes anxiety, breast cancer s/p lumpectomy, HTN, L ankle ORIF, and lumbar surgery.     PT Comments    Patient progressing with mobility and tolerance to therex this session despite continued nausea and pain.  Continue to feel she will need SNF level rehab at d/c due to pain, nausea and migraines limiting progress.  PT to follow acutely.  Follow Up Recommendations  Supervision for mobility/OOB;SNF     Equipment Recommendations  None recommended by PT    Recommendations for Other Services       Precautions / Restrictions Precautions Precautions: Fall Restrictions Weight Bearing Restrictions: Yes RLE Weight Bearing: Weight bearing as tolerated    Mobility  Bed Mobility Overal bed mobility: Needs Assistance Bed Mobility: Supine to Sit     Supine to sit: Min assist;HOB elevated     General bed mobility comments: assist for R LE, increased time, assist to scoot forward on pad  Transfers Overall transfer level: Needs assistance Equipment used: Rolling walker (2 wheeled) Transfers: Sit to/from Stand Sit to Stand: Min assist         General transfer comment: increased time, cues for hand placement  Ambulation/Gait   Ambulation Distance (Feet): 43 Feet Assistive device: Rolling walker (2 wheeled) Gait Pattern/deviations: Step-to pattern;Decreased stride length;Decreased stance time - right;Antalgic     General Gait Details: increased time, cues for distance, walker safety   Stairs            Wheelchair Mobility    Modified Rankin (Stroke Patients Only)       Balance Overall balance assessment: Needs assistance   Sitting balance-Leahy Scale: Fair Sitting balance - Comments: leans away from L hip    Standing balance  support: Bilateral upper extremity supported Standing balance-Leahy Scale: Poor Standing balance comment: UE support for balance                            Cognition Arousal/Alertness: Awake/alert Behavior During Therapy: WFL for tasks assessed/performed Overall Cognitive Status: Within Functional Limits for tasks assessed                                        Exercises Total Joint Exercises Ankle Circles/Pumps: AROM;Both;Supine;10 reps Quad Sets: AROM;Supine;Both;5 reps Short Arc Quad: AROM;Right;5 reps;Supine Heel Slides: AROM;AAROM;Right;5 reps;Supine Hip ABduction/ADduction: AAROM;AROM;Right;5 reps;Supine    General Comments        Pertinent Vitals/Pain Pain Assessment: Faces Faces Pain Scale: Hurts whole lot Pain Location: R hip, L chest Pain Descriptors / Indicators: Aching;Tender;Sore Pain Intervention(s): Limited activity within patient's tolerance;Monitored during session;Repositioned    Home Living                      Prior Function            PT Goals (current goals can now be found in the care plan section) Progress towards PT goals: Progressing toward goals    Frequency    7X/week      PT Plan Current plan remains appropriate    Co-evaluation              AM-PAC PT "6 Clicks" Daily  Activity  Outcome Measure  Difficulty turning over in bed (including adjusting bedclothes, sheets and blankets)?: A Little Difficulty moving from lying on back to sitting on the side of the bed? : Unable Difficulty sitting down on and standing up from a chair with arms (e.g., wheelchair, bedside commode, etc,.)?: Unable Help needed moving to and from a bed to chair (including a wheelchair)?: A Little Help needed walking in hospital room?: A Little Help needed climbing 3-5 steps with a railing? : A Lot 6 Click Score: 13    End of Session Equipment Utilized During Treatment: Gait belt Activity Tolerance: No increased  pain;Patient limited by fatigue Patient left: in chair;with call bell/phone within reach   PT Visit Diagnosis: Pain;Difficulty in walking, not elsewhere classified (R26.2);Unsteadiness on feet (R26.81) Pain - Right/Left: Right Pain - part of body: Hip     Time: 1040-1109 PT Time Calculation (min) (ACUTE ONLY): 29 min  Charges:  $Gait Training: 8-22 mins $Therapeutic Exercise: 8-22 mins                    G CodesMagda Kiel, [place] [phone] 10/17/2016    Reginia Naas 10/17/2016, 12:43 PM

## 2016-10-17 NOTE — Social Work (Signed)
CSW f/u with patient again and she indicated that there insurance company did not provide her any SNF's. CSw advsied that patient may have to return home with home services if she does not get any bed offers for short term rehab.  CSW f/u with admissions and was advised that [facility] is in contract with patient insurance.  CSW will continue to follow.  Elissa Hefty, LCSW Clinical Social Worker [phone]

## 2016-10-17 NOTE — Progress Notes (Signed)
Subjective: 2 Days Post-Op Procedure(s) (LRB): TOTAL HIP ARTHROPLASTY ANTERIOR APPROACH (Right)   Patient resting comfortably in bed. She has heat pack over her shoulder and pec muscle which she states feels better. She states that her shoulder pain may have started after PT once she was back in bed. She also continues with a migraine which does better when she gets her home meds.  Activity level:  wbat Diet tolerance:  ok Voiding:  ok Patient reports pain as moderate.    Objective: Vital signs in last 24 hours: Temp:  [98.4 F (36.9 C)-99.8 F (37.7 C)] 99.8 F (37.7 C) (10/11 0500) Pulse Rate:  [80-94] 94 (10/11 0500) Resp:  [15-16] 15 (10/11 0500) BP: (148-162)/(60-66) 148/64 (10/11 0500) SpO2:  [100 %] 100 % (10/11 0500)  Labs: No results for input(s): HGB in the last 72 hours. No results for input(s): WBC, RBC, HCT, PLT in the last 72 hours. No results for input(s): NA, K, CL, CO2, BUN, CREATININE, GLUCOSE, CALCIUM in the last 72 hours. No results for input(s): LABPT, INR in the last 72 hours.  Physical Exam:  Neurologically intact ABD soft Neurovascular intact Sensation intact distally Intact pulses distally Dorsiflexion/Plantar flexion intact Incision: dressing C/D/I and no drainage No cellulitis present Compartment soft  Left shoulder exam shows near full ROM with pain at end ROM. She has a positive hawkins and impingement test. She is also tender to palpation over her pec muscle on the left. She has pain with resisted rotator cuff strength testing. Normal sensory and motor function throughout arm.   Assessment/Plan:  2 Days Post-Op Procedure(s) (LRB): TOTAL HIP ARTHROPLASTY ANTERIOR APPROACH (Right) Advance diet Up with therapy Plan for discharge tomorrow Discharge to SNF if doing better and cleared by PT. We will resume her home mobic to help with left shoulder/ chest muscle strain. We will also alternate ice and heat packs to help with chest/shoulder  strain. We will continue to follow her closely Continue on ASA 325mg  BID x 4 weeks post op. Follow up in office 2 weeks post op. We appreciate rapid response help and management.  Sherri [name], Sherri [name] 10/17/2016, 8:10 AM

## 2016-10-18 MED ORDER — TIZANIDINE HCL 4 MG PO TABS: 4.0000 mg | ORAL_TABLET | Freq: Four times a day (QID) | ORAL | 0 refills | Status: AC | PRN

## 2016-10-18 MED ORDER — PROMETHAZINE HCL 25 MG PO TABS: 25.0000 mg | ORAL_TABLET | Freq: Four times a day (QID) | ORAL | 1 refills | Status: DC | PRN

## 2016-10-18 MED ORDER — OXYCODONE-ACETAMINOPHEN 10-325 MG PO TABS: 1.0000 | ORAL_TABLET | ORAL | 0 refills | Status: AC | PRN

## 2016-10-18 MED ORDER — DOCUSATE SODIUM 100 MG PO CAPS: 100.0000 mg | ORAL_CAPSULE | Freq: Two times a day (BID) | ORAL | 0 refills | Status: AC

## 2016-10-18 MED ORDER — BISACODYL 5 MG PO TBEC: 5.0000 mg | DELAYED_RELEASE_TABLET | Freq: Every day | ORAL | 0 refills | Status: DC | PRN

## 2016-10-18 MED ORDER — ASPIRIN 325 MG PO TBEC: 325.0000 mg | DELAYED_RELEASE_TABLET | Freq: Two times a day (BID) | ORAL | 0 refills | Status: DC

## 2016-10-18 NOTE — Progress Notes (Signed)
Physical Therapy Treatment Patient Details Name: Sherri [name] MRN: [id number] DOB: [birth year] Today's Date: 10/18/2016    History of Present Illness Pt is a 61 y/o female s/p elective R THA, direct anterior approach. PMH inlcudes anxiety, breast cancer s/p lumpectomy, HTN, L ankle ORIF, and lumbar surgery.     PT Comments    Pt performed increased gait and reviewed stair training in prep for d/c home.  Pt reviewed standing exercises and awaiting d/c home with support from family.     Follow Up Recommendations  Supervision for mobility/OOB;SNF     Equipment Recommendations  None recommended by PT    Recommendations for Other Services       Precautions / Restrictions Precautions Precautions: Fall Precaution Comments: Reviewed THA handout with pt able to tolerate supine and seated exercises during session.  Restrictions Weight Bearing Restrictions: Yes RLE Weight Bearing: Weight bearing as tolerated    Mobility  Bed Mobility Overal bed mobility: Needs Assistance Bed Mobility: Supine to Sit     Supine to sit: Min guard     General bed mobility comments: Pt seated in recliner on arrival.    Transfers Overall transfer level: Needs assistance Equipment used: Rolling walker (2 wheeled) Transfers: Sit to/from Stand Sit to Stand: Supervision Stand pivot transfers: Min guard       General transfer comment: Pt performed with cues for hand placement and remains to require increased time due to pain.    Ambulation/Gait Ambulation/Gait assistance: Min guard Ambulation Distance (Feet): 130 Feet Assistive device: Rolling walker (2 wheeled) Gait Pattern/deviations: Step-to pattern;Decreased stride length;Decreased stance time - right;Antalgic;Step-through pattern   Gait velocity interpretation: Below normal speed for age/gender General Gait Details: Cues for upper trunk control, scapular retraction and hip extension in standing.  Pt required cues for progression to step  through sequencing but remains unable to tolerate progression due to pain.  Pt remains with step to sequencing with cues for gait symmetry and progression of gait distance.     Stairs Stairs: Yes   Stair Management: Forwards;Two rails (additional trial of curb training with RW.  ) Number of Stairs: 6 (+1 stair for curb training.  ) General stair comments: Cues for hand placement on B rails and 6 stairs forwards with B hand rails.  Pt performed additional trial of curb training with cues for sequencing and RW placement to simulate threshold entrance.    Wheelchair Mobility    Modified Rankin (Stroke Patients Only)       Balance Overall balance assessment: Needs assistance   Sitting balance-Leahy Scale: Fair       Standing balance-Leahy Scale: Poor Standing balance comment: UE support for balance                            Cognition Arousal/Alertness: Awake/alert Behavior During Therapy: WFL for tasks assessed/performed Overall Cognitive Status: Within Functional Limits for tasks assessed                                        Exercises Total Joint Exercises Ankle Circles/Pumps: AROM;Both;Supine;10 reps Quad Sets: AROM;Supine;Right;10 reps Short Arc Quad: AROM;Right;Supine;10 reps Heel Slides: AAROM;Right;Supine;10 reps Hip ABduction/ADduction: AROM;Right;10 reps;Standing Long Arc Quad: AROM;Right;10 reps;Seated Knee Flexion: AROM;Right;10 reps;Standing Marching in Standing: AROM;Right;10 reps;Standing Standing Hip Extension: AROM;Right;10 reps;Standing    General Comments        Pertinent  Vitals/Pain Pain Assessment: 0-10 Pain Score: 8  Pain Location: R hip, L chest Pain Descriptors / Indicators: Aching;Tender;Sore Pain Intervention(s): Monitored during session;Repositioned    Home Living                      Prior Function            PT Goals (current goals can now be found in the care plan section) Acute Rehab PT  Goals Patient Stated Goal: to decrease pain  Potential to Achieve Goals: Good Progress towards PT goals: Progressing toward goals    Frequency    7X/week      PT Plan Current plan remains appropriate    Co-evaluation              AM-PAC PT "6 Clicks" Daily Activity  Outcome Measure  Difficulty turning over in bed (including adjusting bedclothes, sheets and blankets)?: A Little Difficulty moving from lying on back to sitting on the side of the bed? : Unable Difficulty sitting down on and standing up from a chair with arms (e.g., wheelchair, bedside commode, etc,.)?: Unable Help needed moving to and from a bed to chair (including a wheelchair)?: A Little Help needed walking in hospital room?: A Little Help needed climbing 3-5 steps with a railing? : A Lot 6 Click Score: 13    End of Session Equipment Utilized During Treatment: Gait belt Activity Tolerance: No increased pain;Patient limited by fatigue Patient left: in chair;with call bell/phone within reach Nurse Communication: Mobility status PT Visit Diagnosis: Pain;Difficulty in walking, not elsewhere classified (R26.2);Unsteadiness on feet (R26.81) Pain - Right/Left: Right Pain - part of body: Hip     Time: 2725-3664 PT Time Calculation (min) (ACUTE ONLY): 21 min  Charges:  $Gait Training: 8-22 mins $Therapeutic Exercise: 8-22 mins                    G Codes:       Governor Rooks, PTA pager [phone]    Cristela Blue 10/18/2016, 2:47 PM

## 2016-10-18 NOTE — Progress Notes (Signed)
Removed IV, provided discharge education/instruction, all questions and concerns addressed, Pt not in distress. Discharged home accompanied by daughter with Pt's belongings.

## 2016-10-18 NOTE — Discharge Summary (Signed)
Patient ID: Sherri [name] MRN: [id number] DOB/AGE: [birth year] 60 y.o.  Admit date: 10/15/2016 Discharge date: 10/18/2016  Admission Diagnoses:  Principal Problem:   Primary localized osteoarthritis of right hip Active Problems:   Primary osteoarthritis of right hip   Discharge Diagnoses:  Same  Past Medical History:  Diagnosis Date  . Anxiety   . Arthritis   . Breast pain, right   . Cancer [facility])    ? breast, cervical   . Depression    situational  . Hypertension    not taken atenolol or lisinopril in >47mos, no money  . Migraines   . Pneumonia   . Ruptured disk     Surgeries: Procedure(s): TOTAL HIP ARTHROPLASTY ANTERIOR APPROACH on 10/15/2016   Consultants:   Discharged Condition: Improved  Hospital Course: Sherri [name] is an 61 y.o. female who was admitted 10/15/2016 for operative treatment ofPrimary localized osteoarthritis of right hip. Patient has severe unremitting pain that affects sleep, daily activities, and work/hobbies. After pre-op clearance the patient was taken to the operating room on 10/15/2016 and underwent  Procedure(s): TOTAL HIP ARTHROPLASTY ANTERIOR APPROACH.    Patient was given perioperative antibiotics: Anti-infectives    Start     Dose/Rate Route Frequency Ordered Stop   10/15/16 1330  ceFAZolin (ANCEF) IVPB 2g/100 mL premix     2 g 200 mL/hr over 30 Minutes Intravenous Every 6 hours 10/15/16 1114 10/15/16 1901   10/15/16 0700  ceFAZolin (ANCEF) IVPB 2g/100 mL premix     2 g 200 mL/hr over 30 Minutes Intravenous To [facility] 10/14/16 0918 10/15/16 0755       Patient was given sequential compression devices, early ambulation, and chemoprophylaxis to prevent DVT.  Patient benefited maximally from hospital stay and there were no complications.    Recent vital signs: Patient Vitals for the past 24 hrs:  BP Temp Temp src Pulse Resp SpO2  10/18/16 0607 (!) 130/59 98.3 F (36.8 C) Oral 84 17 94 %  10/17/16 2028 (!) 143/63 99.7  F (37.6 C) Oral (!) 102 20 98 %  10/17/16 1400 (!) 128/57 99.4 F (37.4 C) Oral (!) 107 18 98 %     Recent laboratory studies: No results for input(s): WBC, HGB, HCT, PLT, NA, K, CL, CO2, BUN, CREATININE, GLUCOSE, INR, CALCIUM in the last 72 hours.  Invalid input(s): PT, 2   Discharge Medications:   Allergies as of 10/18/2016      Reactions   Effexor [venlafaxine Hydrochloride] Shortness Of Breath, Rash, Other (See Comments)   HALLUCINATIONS   Iodine Anaphylaxis   Shellfish Allergy Anaphylaxis   Codeine Swelling, Rash   SWELLING REACTION UNSPECIFIED    Flexeril [cyclobenzaprine Hcl] Other (See Comments)   HALLUCINATIONS AND "OUT OF CONTROL"   Hydromorphone Other (See Comments)   HALLUCINATIONS   Nubain [nalbuphine Hcl] Other (See Comments)   HALLUCINATIONS   Erythromycin Rash      Medication List    STOP taking these medications   aspirin 81 MG chewable tablet Commonly known as:  ASPIRIN CHILDRENS Replaced by:  aspirin 325 MG EC tablet   enoxaparin 40 MG/0.4ML injection Commonly known as:  LOVENOX   hydrochlorothiazide 12.5 MG tablet Commonly known as:  HYDRODIURIL   metoCLOPramide 10 MG tablet Commonly known as:  REGLAN   Oxycodone HCl 10 MG Tabs   valACYclovir 500 MG tablet Commonly known as:  VALTREX     TAKE these medications   aspirin 325 MG EC tablet Take 1 tablet (325 mg  total) by mouth 2 (two) times daily after a meal. Replaces:  aspirin 81 MG chewable tablet   atorvastatin 20 MG tablet Commonly known as:  LIPITOR Take 1 tablet (20 mg total) by mouth daily.   bisacodyl 5 MG EC tablet Commonly known as:  DULCOLAX Take 1 tablet (5 mg total) by mouth daily as needed for moderate constipation.   Butalbital-APAP-Caffeine 50-300-40 MG Caps Take 1 capsule by mouth every 4 (four) hours as needed (for migraine).   docusate sodium 100 MG capsule Commonly known as:  COLACE Take 1 capsule (100 mg total) by mouth 2 (two) times daily. What  changed:  how much to take  when to take this  reasons to take this   DOXYLAMINE SUCCINATE PO Take 0.25 mg by mouth as needed (for sleep).   lidocaine 5 % Commonly known as:  LIDODERM Place 1 patch onto the skin daily. Remove & Discard patch within 12 hours or as directed by MD   meloxicam 15 MG tablet Commonly known as:  MOBIC Take 15 mg by mouth daily.   metaxalone 800 MG tablet Commonly known as:  SKELAXIN Take 1 tablet (800 mg total) by mouth 3 (three) times daily as needed for pain.   OVER THE COUNTER MEDICATION Take 0.25 each by mouth See admin instructions. [employer] CBD-Rich Hemp Oil 750-CBD - Take 1/4 dropper by mouth twice daily   oxyCODONE-acetaminophen 10-325 MG tablet Commonly known as:  PERCOCET Take 1-2 tablets by mouth every 4 (four) hours as needed for pain. What changed:  when to take this   promethazine 25 MG tablet Commonly known as:  PHENERGAN Take 1 tablet (25 mg total) by mouth every 6 (six) hours as needed for nausea.   SALONPAS EX Apply 3 patches topically daily.   tiZANidine 4 MG tablet Commonly known as:  ZANAFLEX Take 1 tablet (4 mg total) by mouth every 6 (six) hours as needed for muscle spasms.            Durable Medical Equipment        Start     Ordered   10/15/16 1115  DME Walker rolling  Once    Question:  Patient needs a walker to treat with the following condition  Answer:  Primary osteoarthritis of right hip   10/15/16 1114   10/15/16 1115  DME 3 n 1  Once     10/15/16 1114   10/15/16 1115  DME Bedside commode  Once    Question:  Patient needs a bedside commode to treat with the following condition  Answer:  Primary osteoarthritis of right hip   10/15/16 1114      Diagnostic Studies: Dg C-arm 61-120 Min  Result Date: 10/15/2016 CLINICAL DATA:  Osteoarthritis of the right hip. Total hip replacement. EXAM: DG C-ARM 61-120 MIN; OPERATIVE RIGHT HIP WITH PELVIS COMPARISON:  Radiographs dated 09/10/2016  FINDINGS: AP C-arm images demonstrate satisfactory appearance of the right total hip prosthesis in the AP projection. No fractures. IMPRESSION: Satisfactory appearance of the right total hip prosthesis in the AP projection. FLUOROSCOPY TIME:  42 seconds C-arm fluoroscopic images were obtained intraoperatively and submitted for post operative interpretation. Electronically Signed   By: Lorriane Shire M.D.   On: 10/15/2016 09:40   Dg Hip Operative Unilat W Or W/o Pelvis Right  Result Date: 10/15/2016 CLINICAL DATA:  Osteoarthritis of the right hip. Total hip replacement. EXAM: DG C-ARM 61-120 MIN; OPERATIVE RIGHT HIP WITH PELVIS COMPARISON:  Radiographs dated 09/10/2016  FINDINGS: AP C-arm images demonstrate satisfactory appearance of the right total hip prosthesis in the AP projection. No fractures. IMPRESSION: Satisfactory appearance of the right total hip prosthesis in the AP projection. FLUOROSCOPY TIME:  42 seconds C-arm fluoroscopic images were obtained intraoperatively and submitted for post operative interpretation. Electronically Signed   By: Lorriane Shire M.D.   On: 10/15/2016 09:40    Disposition: 01-Home or Self Care  Discharge Instructions    Call MD / Call 911    Complete by:  As directed    If you experience chest pain or shortness of breath, CALL 911 and be transported to the hospital emergency room.  If you develope a fever above 101 F, pus (white drainage) or increased drainage or redness at the wound, or calf pain, call your surgeon's office.   Constipation Prevention    Complete by:  As directed    Drink plenty of fluids.  Prune juice may be helpful.  You may use a stool softener, such as Colace (over the counter) 100 mg twice a day.  Use MiraLax (over the counter) for constipation as needed.   Diet - low sodium heart healthy    Complete by:  As directed    Discharge instructions    Complete by:  As directed    INSTRUCTIONS AFTER JOINT REPLACEMENT   Remove items at home which  could result in a fall. This includes throw rugs or furniture in walking pathways ICE to the affected joint every three hours while awake for 30 minutes at a time, for at least the first 3-5 days, and then as needed for pain and swelling.  Continue to use ice for pain and swelling. You may notice swelling that will progress down to the foot and ankle.  This is normal after surgery.  Elevate your leg when you are not up walking on it.   Continue to use the breathing machine you got in the hospital (incentive spirometer) which will help keep your temperature down.  It is common for your temperature to cycle up and down following surgery, especially at night when you are not up moving around and exerting yourself.  The breathing machine keeps your lungs expanded and your temperature down.   DIET:  As you were doing prior to hospitalization, we recommend a well-balanced diet.  DRESSING / WOUND CARE / SHOWERING  You may shower 3 days after surgery, but keep the wounds dry during showering.  You may use an occlusive plastic wrap (Press'n Seal for example), NO SOAKING/SUBMERGING IN THE BATHTUB.  If the bandage gets wet, change with a clean dry gauze.  If the incision gets wet, pat the wound dry with a clean towel.  ACTIVITY  Increase activity slowly as tolerated, but follow the weight bearing instructions below.   No driving for 6 weeks or until further direction given by your physician.  You cannot drive while taking narcotics.  No lifting or carrying greater than 10 lbs. until further directed by your surgeon. Avoid periods of inactivity such as sitting longer than an hour when not asleep. This helps prevent blood clots.  You may return to work once you are authorized by your doctor.     WEIGHT BEARING   Weight bearing as tolerated with assist device (walker, cane, etc) as directed, use it as long as suggested by your surgeon or therapist, typically at least 4-6 weeks.   EXERCISES  Results  after joint replacement surgery are often greatly improved when you follow  the exercise, range of motion and muscle strengthening exercises prescribed by your doctor. Safety measures are also important to protect the joint from further injury. Any time any of these exercises cause you to have increased pain or swelling, decrease what you are doing until you are comfortable again and then slowly increase them. If you have problems or questions, call your caregiver or physical therapist for advice.   Rehabilitation is important following a joint replacement. After just a few days of immobilization, the muscles of the leg can become weakened and shrink (atrophy).  These exercises are designed to build up the tone and strength of the thigh and leg muscles and to improve motion. Often times heat used for twenty to thirty minutes before working out will loosen up your tissues and help with improving the range of motion but do not use heat for the first two weeks following surgery (sometimes heat can increase post-operative swelling).   These exercises can be done on a training (exercise) mat, on the floor, on a table or on a bed. Use whatever works the best and is most comfortable for you.    Use music or television while you are exercising so that the exercises are a pleasant break in your day. This will make your life better with the exercises acting as a break in your routine that you can look forward to.   Perform all exercises about fifteen times, three times per day or as directed.  You should exercise both the operative leg and the other leg as well.   Exercises include:   Quad Sets - Tighten up the muscle on the front of the thigh (Quad) and hold for 5-10 seconds.   Straight Leg Raises - With your knee straight (if you were given a brace, keep it on), lift the leg to 60 degrees, hold for 3 seconds, and slowly lower the leg.  Perform this exercise against resistance later as your leg gets stronger.  Leg  Slides: Lying on your back, slowly slide your foot toward your buttocks, bending your knee up off the floor (only go as far as is comfortable). Then slowly slide your foot back down until your leg is flat on the floor again.  Angel Wings: Lying on your back spread your legs to the side as far apart as you can without causing discomfort.  Hamstring Strength:  Lying on your back, push your heel against the floor with your leg straight by tightening up the muscles of your buttocks.  Repeat, but this time bend your knee to a comfortable angle, and push your heel against the floor.  You may put a pillow under the heel to make it more comfortable if necessary.   A rehabilitation program following joint replacement surgery can speed recovery and prevent re-injury in the future due to weakened muscles. Contact your doctor or a physical therapist for more information on knee rehabilitation.    CONSTIPATION  Constipation is defined medically as fewer than three stools per week and severe constipation as less than one stool per week.  Even if you have a regular bowel pattern at home, your normal regimen is likely to be disrupted due to multiple reasons following surgery.  Combination of anesthesia, postoperative narcotics, change in appetite and fluid intake all can affect your bowels.   YOU MUST use at least one of the following options; they are listed in order of increasing strength to get the job done.  They are all available over the  counter, and you may need to use some, POSSIBLY even all of these options:    Drink plenty of fluids (prune juice may be helpful) and high fiber foods Colace 100 mg by mouth twice a day  Senokot for constipation as directed and as needed Dulcolax (bisacodyl), take with full glass of water  Miralax (polyethylene glycol) once or twice a day as needed.  If you have tried all these things and are unable to have a bowel movement in the first 3-4 days after surgery call either  your surgeon or your primary doctor.    If you experience loose stools or diarrhea, hold the medications until you stool forms back up.  If your symptoms do not get better within 1 week or if they get worse, check with your doctor.  If you experience "the worst abdominal pain ever" or develop nausea or vomiting, please contact the office immediately for further recommendations for treatment.   ITCHING:  If you experience itching with your medications, try taking only a single pain pill, or even half a pain pill at a time.  You can also use Benadryl over the counter for itching or also to help with sleep.   TED HOSE STOCKINGS:  Use stockings on both legs until for at least 2 weeks or as directed by physician office. They may be removed at night for sleeping.  MEDICATIONS:  See your medication summary on the "After Visit Summary" that nursing will review with you.  You may have some home medications which will be placed on hold until you complete the course of blood thinner medication.  It is important for you to complete the blood thinner medication as prescribed.  PRECAUTIONS:  If you experience chest pain or shortness of breath - call 911 immediately for transfer to the hospital emergency department.   If you develop a fever greater that 101 F, purulent drainage from wound, increased redness or drainage from wound, foul odor from the wound/dressing, or calf pain - CONTACT YOUR SURGEON.                                                   FOLLOW-UP APPOINTMENTS:  If you do not already have a post-op appointment, please call the office for an appointment to be seen by your surgeon.  Guidelines for how soon to be seen are listed in your "After Visit Summary", but are typically between 1-4 weeks after surgery.  OTHER INSTRUCTIONS:   Knee Replacement:  Do not place pillow under knee, focus on keeping the knee straight while resting. CPM instructions: 0-90 degrees, 2 hours in the morning, 2 hours in the  afternoon, and 2 hours in the evening. Place foam block, curve side up under heel at all times except when in CPM or when walking.  DO NOT modify, tear, cut, or change the foam block in any way.  MAKE SURE YOU:  Understand these instructions.  Get help right away if you are not doing well or get worse.    Thank you for letting us be a part of your medical care team.  It is a privilege we respect greatly.  We hope these instructions will help you stay on track for a fast and full recovery!   Increase activity slowly as tolerated    Complete by:  As directed  Follow-up Information    Melrose Nakayama, MD. Schedule an appointment as soon as possible for a visit in 2 weeks.   Specialty:  Orthopedic Surgery Contact information: [place] [id number] [phone]            Signed: Rich Fuchs 10/18/2016, 12:18 PM

## 2016-10-18 NOTE — Progress Notes (Signed)
Physical Therapy Treatment Patient Details Name: Sherri [name] MRN: [id number] DOB: Sep 05, 1955 Today's Date: 10/18/2016    History of Present Illness Pt is a 61 y/o female s/p elective R THA, direct anterior approach. PMH inlcudes anxiety, breast cancer s/p lumpectomy, HTN, L ankle ORIF, and lumbar surgery.     PT Comments    Pt performed increased gait and strengthening exercises this am.  Plan for stair training this afternoon as patient plans now to return home.  Pt remains guarded due to pain and requires increased time.  Informed nursing patient will require pm session for stair training.    Follow Up Recommendations  Supervision for mobility/OOB;SNF     Equipment Recommendations  None recommended by PT    Recommendations for Other Services       Precautions / Restrictions Precautions Precautions: Fall Precaution Comments: Reviewed THA handout with pt able to tolerate supine and seated exercises during session.  Restrictions Weight Bearing Restrictions: Yes RLE Weight Bearing: Weight bearing as tolerated    Mobility  Bed Mobility Overal bed mobility: Needs Assistance Bed Mobility: Supine to Sit     Supine to sit: Min guard     General bed mobility comments: Pt able to advance B LEs with cues for hand placement and strategy to edge of bed.  Increased time.    Transfers Overall transfer level: Needs assistance Equipment used: Rolling walker (2 wheeled) Transfers: Sit to/from Stand Sit to Stand: Min assist Stand pivot transfers: Min guard       General transfer comment: Pt performed with cues for hand placement and remains to require increased time due to pain.    Ambulation/Gait Ambulation/Gait assistance: Min guard Ambulation Distance (Feet): 70 Feet Assistive device: Rolling walker (2 wheeled) Gait Pattern/deviations: Step-to pattern;Decreased stride length;Decreased stance time - right;Antalgic;Step-through pattern   Gait velocity interpretation:  Below normal speed for age/gender General Gait Details: Cues for upper trunk control, scapular retraction and hip extension in standing.  Pt required cues for progression to step through sequencing but remains unable to tolerate progression due to pain.  Pt remains with step to sequencing with cues for gait symmetry and progression of gait distance.     Stairs            Wheelchair Mobility    Modified Rankin (Stroke Patients Only)       Balance Overall balance assessment: Needs assistance   Sitting balance-Leahy Scale: Fair       Standing balance-Leahy Scale: Poor                              Cognition Arousal/Alertness: Awake/alert Behavior During Therapy: WFL for tasks assessed/performed Overall Cognitive Status: Within Functional Limits for tasks assessed                                        Exercises Total Joint Exercises Ankle Circles/Pumps: AROM;Both;Supine;10 reps Quad Sets: AROM;Supine;Right;10 reps Short Arc Quad: AROM;Right;Supine;10 reps Heel Slides: AAROM;Right;Supine;10 reps Hip ABduction/ADduction: AAROM;AROM;Right;5 reps;Supine Long Arc Quad: AROM;Right;10 reps;Seated    General Comments        Pertinent Vitals/Pain Pain Assessment: 0-10 Pain Score: 8  Pain Location: R hip, L chest Pain Descriptors / Indicators: Aching;Tender;Sore Pain Intervention(s): Monitored during session;Repositioned    Home Living  Prior Function            PT Goals (current goals can now be found in the care plan section) Acute Rehab PT Goals Patient Stated Goal: to decrease pain  Potential to Achieve Goals: Good Progress towards PT goals: Progressing toward goals    Frequency    7X/week      PT Plan Current plan remains appropriate    Co-evaluation              AM-PAC PT "6 Clicks" Daily Activity  Outcome Measure  Difficulty turning over in bed (including adjusting bedclothes,  sheets and blankets)?: A Little Difficulty moving from lying on back to sitting on the side of the bed? : Unable Difficulty sitting down on and standing up from a chair with arms (e.g., wheelchair, bedside commode, etc,.)?: Unable Help needed moving to and from a bed to chair (including a wheelchair)?: A Little Help needed walking in hospital room?: A Little Help needed climbing 3-5 steps with a railing? : A Lot 6 Click Score: 13    End of Session Equipment Utilized During Treatment: Gait belt Activity Tolerance: No increased pain;Patient limited by fatigue Patient left: in chair;with call bell/phone within reach Nurse Communication: Mobility status PT Visit Diagnosis: Pain;Difficulty in walking, not elsewhere classified (R26.2);Unsteadiness on feet (R26.81) Pain - Right/Left: Right Pain - part of body: Hip     Time: 1030-1054 PT Time Calculation (min) (ACUTE ONLY): 24 min  Charges:  $Gait Training: 8-22 mins $Therapeutic Exercise: 8-22 mins                    G Codes:       Governor Rooks, PTA pager [phone]    Cristela Blue 10/18/2016, 11:02 AM

## 2016-10-18 NOTE — Progress Notes (Signed)
Subjective: 3 Days Post-Op Procedure(s) (LRB): TOTAL HIP ARTHROPLASTY ANTERIOR APPROACH (Right)  Patient is feeling a little better this morning but still has pain in her left shoulder and her right hip. She did get up and walk a little with PT yesterday. Apparently there are no SNF's in her insurance network.   Activity level:  wbat Diet tolerance:  ok Voiding:  ok Patient reports pain as moderate.    Objective: Vital signs in last 24 hours: Temp:  [98.3 F (36.8 C)-99.7 F (37.6 C)] 98.3 F (36.8 C) (10/12 0607) Pulse Rate:  [84-107] 84 (10/12 0607) Resp:  [17-20] 17 (10/12 0607) BP: (128-143)/(57-63) 130/59 (10/12 0607) SpO2:  [94 %-98 %] 94 % (10/12 0607)  Labs: No results for input(s): HGB in the last 72 hours. No results for input(s): WBC, RBC, HCT, PLT in the last 72 hours. No results for input(s): NA, K, CL, CO2, BUN, CREATININE, GLUCOSE, CALCIUM in the last 72 hours. No results for input(s): LABPT, INR in the last 72 hours.  Physical Exam:  Neurologically intact ABD soft Neurovascular intact Sensation intact distally Intact pulses distally Dorsiflexion/Plantar flexion intact Incision: dressing C/D/I and no drainage No cellulitis present Compartment soft  Assessment/Plan:  3 Days Post-Op Procedure(s) (LRB): TOTAL HIP ARTHROPLASTY ANTERIOR APPROACH (Right) Advance diet Up with therapy Discharge home with home health since there are no SNF's in her network. Hopefully she can go home today if cleared by PT. If not then she can go tomorrow. I will check back around lunch to check in her progress.  Continue on ASA 325mg  BID x 4 weeks post op. We will also alternate ice and heat packs to help with chest/shoulder strain. Follow up in office 2 weeks post op.  Jaleeyah Munce, Larwance Sachs 10/18/2016, 8:03 AM

## 2016-10-18 NOTE — Care Management Note (Signed)
Case Management Note  Patient Details  Name: Sherri [name] MRN: [id number] Date of Birth: 1955/07/05  Subjective/Objective:    Right THA                Action/Plan:  NCM spoke to pt and dtr, Rhonda at bedside. Pt has RW and 3n1 bedside commode. Attempted to arrange [facility]. [facility], [facility] do not accept pt's insurance for [place]. Pt will call office to arrange outpt PT.  PCP JEFFERY, CHELLE MD    Expected Discharge Date:  10/18/16               Expected Discharge Plan:  Home/Self Care  In-House Referral:  NA  Discharge planning Services  CM Consult  Post Acute Care Choice:  NA Choice offered to:  NA  DME Arranged:  N/A DME Agency:  NA  HH Arranged:  NA HH Agency:  NA  Status of Service:  Completed, signed off  If discussed at [place] of Stay Meetings, dates discussed:    Additional Comments:  Erenest Rasher, RN 10/18/2016, 3:23 PM

## 2017-04-14 ENCOUNTER — Encounter: Payer: Self-pay | Admitting: Physician Assistant

## 2017-05-06 ENCOUNTER — Encounter: Payer: Self-pay | Admitting: Physician Assistant

## 2017-05-06 ENCOUNTER — Ambulatory Visit (INDEPENDENT_AMBULATORY_CARE_PROVIDER_SITE_OTHER): Admitting: Physician Assistant

## 2017-05-06 ENCOUNTER — Other Ambulatory Visit: Payer: Self-pay

## 2017-05-06 VITALS — BP 146/80 | HR 92 | Temp 98.9°F | Resp 16 | Ht 68.5 in | Wt 240.8 lb

## 2017-05-06 DIAGNOSIS — Z1211 Encounter for screening for malignant neoplasm of colon: Secondary | ICD-10-CM | POA: Diagnosis not present

## 2017-05-06 DIAGNOSIS — Z114 Encounter for screening for human immunodeficiency virus [HIV]: Secondary | ICD-10-CM | POA: Diagnosis not present

## 2017-05-06 DIAGNOSIS — Z1231 Encounter for screening mammogram for malignant neoplasm of breast: Secondary | ICD-10-CM | POA: Diagnosis not present

## 2017-05-06 DIAGNOSIS — I10 Essential (primary) hypertension: Secondary | ICD-10-CM

## 2017-05-06 DIAGNOSIS — R7303 Prediabetes: Secondary | ICD-10-CM

## 2017-05-06 DIAGNOSIS — N644 Mastodynia: Secondary | ICD-10-CM | POA: Diagnosis not present

## 2017-05-06 DIAGNOSIS — Z1159 Encounter for screening for other viral diseases: Secondary | ICD-10-CM | POA: Diagnosis not present

## 2017-05-06 DIAGNOSIS — F329 Major depressive disorder, single episode, unspecified: Secondary | ICD-10-CM | POA: Diagnosis not present

## 2017-05-06 DIAGNOSIS — E785 Hyperlipidemia, unspecified: Secondary | ICD-10-CM | POA: Diagnosis not present

## 2017-05-06 DIAGNOSIS — B009 Herpesviral infection, unspecified: Secondary | ICD-10-CM

## 2017-05-06 DIAGNOSIS — L03818 Cellulitis of other sites: Secondary | ICD-10-CM

## 2017-05-06 DIAGNOSIS — F330 Major depressive disorder, recurrent, mild: Secondary | ICD-10-CM | POA: Diagnosis not present

## 2017-05-06 DIAGNOSIS — G43019 Migraine without aura, intractable, without status migrainosus: Secondary | ICD-10-CM

## 2017-05-06 MED ORDER — DOXYCYCLINE HYCLATE 100 MG PO CAPS: 100.0000 mg | ORAL_CAPSULE | Freq: Two times a day (BID) | ORAL | 0 refills | Status: AC

## 2017-05-06 MED ORDER — ATORVASTATIN CALCIUM 20 MG PO TABS: 20.0000 mg | ORAL_TABLET | Freq: Every day | ORAL | 3 refills | Status: DC

## 2017-05-06 MED ORDER — BUPROPION HCL ER (XL) 150 MG PO TB24: 150.0000 mg | ORAL_TABLET | Freq: Every day | ORAL | 3 refills | Status: AC

## 2017-05-06 MED ORDER — VALACYCLOVIR HCL 500 MG PO TABS: ORAL_TABLET | ORAL | 3 refills | Status: AC

## 2017-05-06 MED ORDER — AMLODIPINE BESYLATE 5 MG PO TABS: 5.0000 mg | ORAL_TABLET | Freq: Every day | ORAL | 3 refills | Status: DC

## 2017-05-06 NOTE — Patient Instructions (Signed)
     IF you received an x-ray today, you will receive an invoice from [facility]. Please contact [facility] at [phone] with questions or concerns regarding your invoice.   IF you received labwork today, you will receive an invoice from [facility]. Please contact [facility] at [phone] with questions or concerns regarding your invoice.   Our billing staff will not be able to assist you with questions regarding bills from these companies.  You will be contacted with the lab results as soon as they are available. The fastest way to get your results is to activate your My Chart account. Instructions are located on the last page of this paperwork. If you have not heard from us regarding the results in 2 weeks, please contact this office.     

## 2017-05-06 NOTE — Progress Notes (Signed)
Subjective:    Patient ID: Sherri [name], female    DOB: 09/09/1955, 62 y.o.   MRN: [id number] Chief Complaint  Patient presents with  . Breast Pain    right side , fell on the 10th, has implants   . Medication Refill    lipitor    HPI  62 yo female presents for evaluation of RIGHT breast pain after a fall on 04/16/17.   Breast pain: Tripped over a mat and fell forward flat onto her body, was unable to break her fall with her hands, arms. Fell directly on her chest/breasts, R breast has implant from when she was 24. 2-3 days after, pain in her right chest began. This past week, pain has increased, 6/10.  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 Denies SOB, wheezing.  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 Increased stress and many days leaving work crying. She reports this stress at work has been occurring for 1.5 years. Recalls previously being on Bupropion (wellbutrin xl) with significant relief and would like to try this again.   Blood Pressure: She is also concerned about her blood pressure today. Reports checking her BP at [facility] with some results in the 190s/100s. At her neuro surgery visit 04/15/17, BP was 189/101.  Today her blood pressure is 146/80.  Headaches: Reports having recurrent headaches, at least 2x/week. This is a chronic problem where she has had extensive work up in the past with neurology, but has not seen them recently. She has had botox injections, and currently takes butalbital-APAP-caffeine for these which help, but she does not take daily due to dislike for medications. Unable to get botox again due to insurance. Headaches are not worsening and have been stable.   Denies vision changes, dizziness, numbness, weakness, syncope, hematuria, hematochezia, melena.  Review of Systems  Constitutional: Negative for chills, diaphoresis, fatigue and fever.  HENT: Negative.   Eyes:  Negative for photophobia and visual disturbance.  Respiratory: Negative for apnea, cough, choking, chest tightness, shortness of breath, wheezing and stridor.   Cardiovascular: Negative for chest pain.  Gastrointestinal: Negative for blood in stool, diarrhea, nausea and vomiting.  Endocrine: Negative.   Genitourinary: Negative for hematuria.  Musculoskeletal: Positive for myalgias (Right lateral breast pain).  Skin: Positive for color change (Bruising R breast).  Neurological: Positive for headaches. Negative for dizziness, tremors, syncope, speech difficulty, weakness, light-headedness and numbness.  Hematological: Negative.   Psychiatric/Behavioral: Negative.       Objective:   Physical Exam  Constitutional: She is oriented to person, place, and time. She appears well-developed and well-nourished. No distress.  HENT:  Head: Normocephalic and atraumatic.  Eyes: Conjunctivae are normal. Right eye exhibits no discharge. Left eye exhibits no discharge. No scleral icterus.  Neck: Normal range of motion. Neck supple. No tracheal deviation present. No thyromegaly present.  Cardiovascular: Normal rate, regular rhythm, normal heart sounds and intact distal pulses. Exam reveals no gallop and no friction rub.  No murmur heard. Pulmonary/Chest: Effort normal and breath sounds normal. No stridor. No respiratory distress. She has no wheezes. She has no rales. She exhibits no tenderness.    Lymphadenopathy:    She has no cervical adenopathy.  Neurological: She is alert and oriented to person, place, and time.  Skin: Skin is warm and dry. She is not diaphoretic.  Psychiatric: She has a normal mood and affect. Her behavior is normal.  Assessment & Plan:  1. Essential hypertension Try amlodipine, return in 1 month to re-evaluate. Amlodipine chosen to help reduce migraines and control blood pressure. - CBC with Differential/Platelet - Urinalysis, dipstick only - Comprehensive metabolic  panel - TSH - amLODipine (NORVASC) 5 MG tablet; Take 1 tablet (5 mg total) by mouth daily.  Dispense: 90 tablet; Refill: 3  2. Mild episode of recurrent major depressive disorder (HCC) Try buproprion, f/u in 1 month.  3. Intractable migraine without aura and without status migrainosus Try amlodipine to help with prevention.  4. Reactive depression Try buproprion, f/u in 1 month. - buPROPion (WELLBUTRIN XL) 150 MG 24 hr tablet; Take 1 tablet (150 mg total) by mouth daily.  Dispense: 90 tablet; Refill: 3  5. Need for hepatitis C screening test Await labs and f/u - Hepatitis C antibody  6. Screening for HIV (human immunodeficiency virus) Await labs and f/u - HIV antibody  7. Hyperlipidemia, unspecified hyperlipidemia type Await labs and adjust as needed - Lipid panel - atorvastatin (LIPITOR) 20 MG tablet; Take 1 tablet (20 mg total) by mouth daily.  Dispense: 90 tablet; Refill: 3  8. Prediabetes Await labs and f/u  - Hemoglobin A1c  9. Screening for colon cancer - Ambulatory referral to Gastroenterology  10. Encounter for screening mammogram for breast cancer Await labs and f/u - MM Digital Screening Unilat L; Future  11. Cellulitis of other specified site Likely cellulitis, try doxycycline. Will send for diagnostic breast mammogram. - doxycycline (VIBRAMYCIN) 100 MG capsule; Take 1 capsule (100 mg total) by mouth 2 (two) times daily for 10 days.  Dispense: 20 capsule; Refill: 0  12. Breast pain, right - MM DIAG BREAST TOMO UNI RIGHT; Future - US BREAST LTD UNI RIGHT INC AXILLA; Future  13. HSV-2 (herpes simplex virus 2) infection Having recurring lesions due to increased stress. Out of valacyclovir. - valACYclovir (VALTREX) 500 MG tablet; Take 1 PO QD for suppression; INCREASE to BID x 3 days PRN outbreak  Dispense: 100 tablet; Refill: 3  Return in about 1 month (around 06/03/2017) for Annual Exam (will also recheck BP and mood at that time).

## 2017-05-06 NOTE — Progress Notes (Signed)
Patient ID: Sherri [name], female    DOB: 1955-11-16, 62 y.o.   MRN: [id number]  PCP: Harrison Mons, PA-C  Chief Complaint  Patient presents with  . Breast Pain    right side , fell on the 10th, has implants   . Medication Refill    lipitor    Subjective:   Presents for evaluation of hyperlipidemia and right-sided breast pain.  I last saw her 02/09/2014..  1. Breast pain following fall on 04/16/2017.  The right breast is painful, somewhat swollen.  Feels warm to the touch.  No drainage.  Recall that she has RIGHT breast implant for augmentation following lumpectomy for reportedly benign lump. 2. Work stress. Difficult coworker x 18 months. Previously used bupropion, to manage mood following her husband's death. Thinks that she would benefit from using that again. 3. BP measurements in the community 190's/100's. 4. Headaches are managed by Neurology (though per record, not seen since 02/10/2012). Uses Fioricet for PRN treatment. Daily headaches x 1 week, usually twice weekly headaches.   Health Maintenance Due  Topic Date Due  . Hepatitis C Screening  07/12/1955  . HIV Screening  03/15/1971  . PAP SMEAR  03/14/1977  . COLONOSCOPY  03/15/2006  . MAMMOGRAM  12/16/2014     Review of Systems As above. No fever, chills. No nausea, vomiting.    Patient Active Problem List   Diagnosis Date Noted  . Primary localized osteoarthritis of right hip 10/15/2016  . Primary osteoarthritis of right hip 10/15/2016  . Chest pain 12/19/2015  . Closed left ankle fracture 12/17/2015  . Intractable migraine without aura and without status migrainosus 09/23/2013  . Essential hypertension 09/23/2013  . Obesity, unspecified 09/23/2013  . Migraine 02/14/2011  . Obesity 02/14/2011  . 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 Blindness of right eye 02/14/2011  . Depression 02/14/2011     Prior to Admission medications   Medication Sig Start Date End Date Taking?  Authorizing Provider  aspirin EC 325 MG EC tablet Take 1 tablet (325 mg total) by mouth 2 (two) times daily after a meal. 10/18/16  Yes Nida, Mitzi Hansen, PA-C  atorvastatin (LIPITOR) 20 MG tablet Take 1 tablet (20 mg total) by mouth daily. 12/20/15  Yes Florencia Reasons, MD  bisacodyl (DULCOLAX) 5 MG EC tablet Take 1 tablet (5 mg total) by mouth daily as needed for moderate constipation. 10/18/16  Yes Loni Dolly, PA-C  Butalbital-APAP-Caffeine 50-300-40 MG CAPS Take 1 capsule by mouth every 4 (four) hours as needed (for migraine).   Yes [provider]  docusate sodium (COLACE) 100 MG capsule Take 1 capsule (100 mg total) by mouth 2 (two) times daily. 10/18/16  Yes Loni Dolly, PA-C  DOXYLAMINE SUCCINATE PO Take 0.25 mg by mouth as needed (for sleep).   Yes [provider]  lidocaine (LIDODERM) 5 % Place 1 patch onto the skin daily. Remove & Discard patch within 12 hours or as directed by MD   Yes [provider]  Liniments (SALONPAS EX) Apply 3 patches topically daily.   Yes [provider]  meloxicam (MOBIC) 15 MG tablet Take 15 mg by mouth daily.   Yes [provider]  metaxalone (SKELAXIN) 800 MG tablet Take 1 tablet (800 mg total) by mouth 3 (three) times daily as needed for pain. 02/27/12  Yes Kayin Osment, PA-C  OVER THE COUNTER MEDICATION Take 0.25 each by mouth See admin instructions. [employer] CBD-Rich Hemp Oil 750-CBD - Take  1/4 dropper by mouth twice daily   Yes [provider]  oxyCODONE-acetaminophen (PERCOCET) 10-325 MG tablet Take 1-2 tablets by mouth every 4 (four) hours as needed for pain. 10/18/16  Yes Loni Dolly, PA-C  promethazine (PHENERGAN) 25 MG tablet Take 1 tablet (25 mg total) by mouth every 6 (six) hours as needed for nausea. 10/18/16  Yes Loni Dolly, PA-C  tiZANidine (ZANAFLEX) 4 MG tablet Take 1 tablet (4 mg total) by mouth every 6 (six) hours as needed for muscle spasms. 10/18/16 10/18/17 Yes Loni Dolly,  PA-C     Allergies  Allergen Reactions  . Effexor [Venlafaxine Hydrochloride] Shortness Of Breath, Rash and Other (See Comments)    HALLUCINATIONS  . Iodine Anaphylaxis  . Shellfish Allergy Anaphylaxis  . Codeine Swelling and Rash    SWELLING REACTION UNSPECIFIED   . Flexeril [Cyclobenzaprine Hcl] Other (See Comments)    HALLUCINATIONS AND "OUT OF CONTROL"  . Hydromorphone Other (See Comments)    HALLUCINATIONS  . Nubain [Nalbuphine Hcl] Other (See Comments)    HALLUCINATIONS  . Erythromycin Rash       Objective:  Physical Exam  Constitutional: She is oriented to person, place, and time. She appears well-developed and well-nourished. She is active and cooperative. No distress.  BP (!) 146/80   Pulse 92   Temp 98.9 F (37.2 C)   Resp 16   Ht 5' 8.5" (1.74 m)   Wt 240 lb 12.8 oz (109.2 kg)   SpO2 98%   BMI 36.08 kg/m   HENT:  Head: Normocephalic and atraumatic.  Right Ear: Hearing normal.  Left Ear: Hearing normal.  Eyes: Conjunctivae are normal. No scleral icterus.  Neck: Normal range of motion. Neck supple. No thyromegaly present.  Cardiovascular: Normal rate, regular rhythm and normal heart sounds.  Pulses:      Radial pulses are 2+ on the right side, and 2+ on the left side.  Pulmonary/Chest: Effort normal and breath sounds normal. Right breast exhibits skin change (erythema and increased warmth. No lesions, no peau de orange) and tenderness. Right breast exhibits no inverted nipple, no mass and no nipple discharge. Left breast exhibits no inverted nipple, no mass, no nipple discharge, no skin change and no tenderness. No breast discharge or bleeding.  Lymphadenopathy:       Head (right side): No tonsillar, no preauricular, no posterior auricular and no occipital adenopathy present.       Head (left side): No tonsillar, no preauricular, no posterior auricular and no occipital adenopathy present.    She has no cervical adenopathy.       Right: No supraclavicular  adenopathy present.       Left: No supraclavicular adenopathy present.  Neurological: She is alert and oriented to person, place, and time. No sensory deficit.  Skin: Skin is warm, dry and intact. No rash noted. No cyanosis or erythema. Nails show no clubbing.  Psychiatric: She has a normal mood and affect. Her speech is normal and behavior is normal.      Assessment & Plan:   Problem List Items Addressed This Visit    Intractable migraine without aura and without status migrainosus   Relevant Medications   amLODipine (NORVASC) 5 MG tablet   buPROPion (WELLBUTRIN XL) 150 MG 24 hr tablet   atorvastatin (LIPITOR) 20 MG tablet   HSV-2 (herpes simplex virus 2) infection   Relevant Medications   valACYclovir (VALTREX) 500 MG tablet   Essential hypertension - Primary    Above goal today, likely  due to breast pain.  Continue to monitor.      Relevant Medications   amLODipine (NORVASC) 5 MG tablet   atorvastatin (LIPITOR) 20 MG tablet   Other Relevant Orders   CBC with Differential/Platelet (Completed)   Urinalysis, dipstick only (Completed)   Comprehensive metabolic panel (Completed)   TSH (Completed)   Depression (Chronic)    Resume treatment.      Relevant Medications   buPROPion (WELLBUTRIN XL) 150 MG 24 hr tablet    Other Visit Diagnoses    Need for hepatitis C screening test       Relevant Orders   Hepatitis C antibody (Completed)   Screening for HIV (human immunodeficiency virus)       Relevant Orders   HIV antibody (Completed)   Hyperlipidemia, unspecified hyperlipidemia type       Relevant Medications   amLODipine (NORVASC) 5 MG tablet   atorvastatin (LIPITOR) 20 MG tablet   Other Relevant Orders   Lipid panel (Completed)   Prediabetes       Relevant Orders   Hemoglobin A1c (Completed)   Screening for colon cancer       Relevant Orders   Ambulatory referral to Gastroenterology   Encounter for screening mammogram for breast cancer       Relevant Orders   MM  Digital Screening Unilat L   Cellulitis of other specified site       Doxycycline   Breast pain, right       Concern for ruptured implant versus cellulitis.   Relevant Orders   US BREAST LTD UNI RIGHT INC AXILLA (Completed)   MM DIAG BREAST W/IMPLANT TOMO BILATERAL (Completed)       Return in about 1 month (around 06/03/2017) for Annual Exam (will also recheck BP and mood at that time).   Fara Chute, PA-C [facility]

## 2017-05-07 LAB — HIV ANTIBODY (ROUTINE TESTING W REFLEX): HIV SCREEN 4TH GENERATION: NONREACTIVE

## 2017-05-07 LAB — CBC WITH DIFFERENTIAL/PLATELET
BASOS ABS: 0 10*3/uL (ref 0.0–0.2)
Basos: 0 %
EOS (ABSOLUTE): 0.1 10*3/uL (ref 0.0–0.4)
EOS: 1 %
HEMATOCRIT: 39.2 % (ref 34.0–46.6)
Hemoglobin: 12.5 g/dL (ref 11.1–15.9)
IMMATURE GRANULOCYTES: 0 %
Immature Grans (Abs): 0 10*3/uL (ref 0.0–0.1)
Lymphocytes Absolute: 1.1 10*3/uL (ref 0.7–3.1)
Lymphs: 16 %
MCH: 26.4 pg — ABNORMAL LOW (ref 26.6–33.0)
MCHC: 31.9 g/dL (ref 31.5–35.7)
MCV: 83 fL (ref 79–97)
MONOS ABS: 0.4 10*3/uL (ref 0.1–0.9)
Monocytes: 6 %
NEUTROS PCT: 77 %
Neutrophils Absolute: 5.6 10*3/uL (ref 1.4–7.0)
PLATELETS: 296 10*3/uL (ref 150–379)
RBC: 4.74 x10E6/uL (ref 3.77–5.28)
RDW: 15.4 % (ref 12.3–15.4)
WBC: 7.2 10*3/uL (ref 3.4–10.8)

## 2017-05-07 LAB — LIPID PANEL
CHOL/HDL RATIO: 2.7 ratio (ref 0.0–4.4)
Cholesterol, Total: 195 mg/dL (ref 100–199)
HDL: 71 mg/dL (ref >39)
LDL Calculated: 101 mg/dL — ABNORMAL HIGH (ref 0–99)
Triglycerides: 115 mg/dL (ref 0–149)
VLDL CHOLESTEROL CAL: 23 mg/dL (ref 5–40)

## 2017-05-07 LAB — HEPATITIS C ANTIBODY: Hep C Virus Ab: <0.1 s/co ratio (ref 0.0–0.9)

## 2017-05-07 LAB — COMPREHENSIVE METABOLIC PANEL
ALK PHOS: 86 IU/L (ref 39–117)
ALT: 7 IU/L (ref 0–32)
AST: 12 IU/L (ref 0–40)
Albumin/Globulin Ratio: 1.9 (ref 1.2–2.2)
Albumin: 4.6 g/dL (ref 3.6–4.8)
BUN / CREAT RATIO: 17 (ref 12–28)
BUN: 10 mg/dL (ref 8–27)
CHLORIDE: 103 mmol/L (ref 96–106)
CO2: 25 mmol/L (ref 20–29)
CREATININE: 0.6 mg/dL (ref 0.57–1.00)
Calcium: 9.2 mg/dL (ref 8.7–10.3)
GFR calc Af Amer: 114 mL/min/{1.73_m2} (ref >59)
GFR calc non Af Amer: 99 mL/min/{1.73_m2} (ref >59)
GLUCOSE: 96 mg/dL (ref 65–99)
Globulin, Total: 2.4 g/dL (ref 1.5–4.5)
Potassium: 4.4 mmol/L (ref 3.5–5.2)
Sodium: 143 mmol/L (ref 134–144)
Total Protein: 7 g/dL (ref 6.0–8.5)

## 2017-05-07 LAB — URINALYSIS, DIPSTICK ONLY
BILIRUBIN UA: NEGATIVE
GLUCOSE, UA: NEGATIVE
KETONES UA: NEGATIVE
Leukocytes, UA: NEGATIVE
Nitrite, UA: NEGATIVE
PROTEIN UA: NEGATIVE
RBC UA: NEGATIVE
SPEC GRAV UA: 1.015 (ref 1.005–1.030)
UUROB: 0.2 mg/dL (ref 0.2–1.0)
pH, UA: 6.5 (ref 5.0–7.5)

## 2017-05-07 LAB — HEMOGLOBIN A1C
ESTIMATED AVERAGE GLUCOSE: 117 mg/dL
Hgb A1c MFr Bld: 5.7 % — ABNORMAL HIGH (ref 4.8–5.6)

## 2017-05-07 LAB — TSH: TSH: 1.04 u[IU]/mL (ref 0.450–4.500)

## 2017-05-20 ENCOUNTER — Other Ambulatory Visit: Payer: Self-pay | Admitting: Physician Assistant

## 2017-05-20 ENCOUNTER — Telehealth: Payer: Self-pay | Admitting: Physician Assistant

## 2017-05-20 ENCOUNTER — Ambulatory Visit
Admission: RE | Admit: 2017-05-20 | Discharge: 2017-05-20 | Disposition: A | Discharge status: Home / Self Care | Source: Ambulatory Visit | Attending: Physician Assistant | Admitting: Physician Assistant

## 2017-05-20 DIAGNOSIS — N644 Mastodynia: Secondary | ICD-10-CM

## 2017-05-20 NOTE — Telephone Encounter (Signed)
Copied from [place] [id number]. Topic: Referral - Request >> May 20, 2017  3:32 PM Aurelio Brash B wrote: CRM for notification. See Telephone encounter for: 05/20/17. Angie from [facility] called to say the pt will need a referral to plastic surgeon for ruptured implant. Angie's contact number is [phone]

## 2017-05-21 ENCOUNTER — Other Ambulatory Visit: Payer: Self-pay | Admitting: Physician Assistant

## 2017-05-21 DIAGNOSIS — T8543XA Leakage of breast prosthesis and implant, initial encounter: Secondary | ICD-10-CM

## 2017-05-21 DIAGNOSIS — R234 Changes in skin texture: Secondary | ICD-10-CM

## 2017-05-21 NOTE — Progress Notes (Signed)
Ruptured breast implant. Radiology requests that I place referral to plastic surgery.  Orders Placed This Encounter  Procedures  . Ambulatory referral to Plastic Surgery    Referral Priority:   Routine    Referral Type:   Surgical    Referral Reason:   Specialty Services Required    Referred to Provider:   Wallace Going, DO    Requested Specialty:   Plastic Surgery    Number of Visits Requested:   1     US Breast Ltd Uni Right Inc Axilla  Result Date: 05/20/2017 CLINICAL DATA:  Patient fell 3 weeks ago and injured the RIGHT breast. After the fall, patient noted black and blue changes in the RIGHT breast, significantly improved following a course of antibiotics. She notes persistent redness and tenderness in the RIGHT breast. Patient had RIGHT silicone implant in 9604. EXAM: DIGITAL DIAGNOSTIC RIGHT MAMMOGRAM WITH CAD AND TOMO ULTRASOUND RIGHT BREAST The patient has RIGHT retroglandular silicone implant. Standard and implant displaced views were performed. COMPARISON:  12/15/2012 ACR Breast Density Category a: The breast tissue is almost entirely fatty. FINDINGS: There is free silicone throughout the central and LATERAL portion of the RIGHT breast, a new finding since the previous exam. The area of free silicone is large, measuring at least 10 x 10 centimeters. Mammographic images were processed with CAD. On physical exam, there is erythema and skin thickening of the majority of the RIGHT breast, worse in the 7 o'clock location. In this QUADRANT, there are peau d'orange changes. Patient is tender on physical exam. I palpate no discrete mass. Targeted ultrasound is performed, showing ultrasound findings characteristic for free silicone especially within the LOWER OUTER QUADRANT of the RIGHT breast. Edema is also identified in the [place]. Evaluation of the RIGHT axilla shows no suspicious adenopathy. Silicone containing lymph node is identified in the LOWER axilla. IMPRESSION: 1.  Silicone implant rupture with significant free silicone throughout the LOWER OUTER QUADRANT of the breast. 2. Erythema and skin thickening are likely related to inflammation and recent trauma but follow-up is indicated to exclude malignancy. RECOMMENDATION: 1. Plastic surgery referral for consultation after implant rupture. 2. Follow-up RIGHT breast ultrasound and physical exam in 3 months to assess for resolution of skin changes and erythema. Consider punch biopsy if changes persist. I have discussed the findings and recommendations with the patient. Results were also provided in writing at the conclusion of the visit. If applicable, a reminder letter will be sent to the patient regarding the next appointment. BI-RADS CATEGORY  3: Probably benign. Electronically Signed   By: Nolon Nations M.D.   On: 05/20/2017 15:01   Mm Diag Breast W/implant Tomo Bilateral  Result Date: 05/20/2017 CLINICAL DATA:  Patient fell 3 weeks ago and injured the RIGHT breast. After the fall, patient noted black and blue changes in the RIGHT breast, significantly improved following a course of antibiotics. She notes persistent redness and tenderness in the RIGHT breast. Patient had RIGHT silicone implant in 5409. EXAM: DIGITAL DIAGNOSTIC RIGHT MAMMOGRAM WITH CAD AND TOMO ULTRASOUND RIGHT BREAST The patient has RIGHT retroglandular silicone implant. Standard and implant displaced views were performed. COMPARISON:  12/15/2012 ACR Breast Density Category a: The breast tissue is almost entirely fatty. FINDINGS: There is free silicone throughout the central and LATERAL portion of the RIGHT breast, a new finding since the previous exam. The area of free silicone is large, measuring at least 10 x 10 centimeters. Mammographic images were processed with CAD. On physical exam,  there is erythema and skin thickening of the majority of the RIGHT breast, worse in the 7 o'clock location. In this QUADRANT, there are peau d'orange changes. Patient is  tender on physical exam. I palpate no discrete mass. Targeted ultrasound is performed, showing ultrasound findings characteristic for free silicone especially within the LOWER OUTER QUADRANT of the RIGHT breast. Edema is also identified in the [place]. Evaluation of the RIGHT axilla shows no suspicious adenopathy. Silicone containing lymph node is identified in the LOWER axilla. IMPRESSION: 1. Silicone implant rupture with significant free silicone throughout the LOWER OUTER QUADRANT of the breast. 2. Erythema and skin thickening are likely related to inflammation and recent trauma but follow-up is indicated to exclude malignancy. RECOMMENDATION: 1. Plastic surgery referral for consultation after implant rupture. 2. Follow-up RIGHT breast ultrasound and physical exam in 3 months to assess for resolution of skin changes and erythema. Consider punch biopsy if changes persist. I have discussed the findings and recommendations with the patient. Results were also provided in writing at the conclusion of the visit. If applicable, a reminder letter will be sent to the patient regarding the next appointment. BI-RADS CATEGORY  3: Probably benign. Electronically Signed   By: Nolon Nations M.D.   On: 05/20/2017 15:01

## 2017-05-21 NOTE — Telephone Encounter (Signed)
Already addressed

## 2017-05-27 DIAGNOSIS — M546 Pain in thoracic spine: Secondary | ICD-10-CM

## 2017-05-27 DIAGNOSIS — T8543XA Leakage of breast prosthesis and implant, initial encounter: Secondary | ICD-10-CM | POA: Insufficient documentation

## 2017-05-27 DIAGNOSIS — G8929 Other chronic pain: Secondary | ICD-10-CM | POA: Insufficient documentation

## 2017-06-01 NOTE — Assessment & Plan Note (Signed)
Above goal today, likely due to breast pain.  Continue to monitor.

## 2017-06-01 NOTE — Assessment & Plan Note (Signed)
Resume treatment.

## 2017-06-05 ENCOUNTER — Encounter: Payer: Self-pay | Admitting: Physician Assistant

## 2017-06-10 ENCOUNTER — Ambulatory Visit (INDEPENDENT_AMBULATORY_CARE_PROVIDER_SITE_OTHER): Admitting: Physician Assistant

## 2017-06-10 ENCOUNTER — Encounter: Payer: Self-pay | Admitting: Physician Assistant

## 2017-06-10 ENCOUNTER — Other Ambulatory Visit: Payer: Self-pay

## 2017-06-10 VITALS — BP 138/84 | HR 91 | Temp 99.0°F | Resp 16 | Ht 68.5 in | Wt 240.6 lb

## 2017-06-10 DIAGNOSIS — E785 Hyperlipidemia, unspecified: Secondary | ICD-10-CM | POA: Diagnosis not present

## 2017-06-10 DIAGNOSIS — Z124 Encounter for screening for malignant neoplasm of cervix: Secondary | ICD-10-CM | POA: Diagnosis not present

## 2017-06-10 DIAGNOSIS — R7303 Prediabetes: Secondary | ICD-10-CM | POA: Diagnosis not present

## 2017-06-10 DIAGNOSIS — I10 Essential (primary) hypertension: Secondary | ICD-10-CM

## 2017-06-10 DIAGNOSIS — Z0000 Encounter for general adult medical examination without abnormal findings: Secondary | ICD-10-CM | POA: Diagnosis not present

## 2017-06-10 DIAGNOSIS — T8543XD Leakage of breast prosthesis and implant, subsequent encounter: Secondary | ICD-10-CM | POA: Diagnosis not present

## 2017-06-10 NOTE — Progress Notes (Signed)
Patient ID: Sherri [name], female    DOB: 01-Mar-1955, 62 y.o.   MRN: [id number]  PCP: Harrison Mons, PA-C  Chief Complaint  Patient presents with  . Annual Exam    Subjective:   Presents for [employer].  Cervical Cancer Screening: Last pap prior to 02/05/2011. History of abnormal pap test, had treatment (They had to go in and burn a whole bunch of stuff on my servix and uterus." Estamated about 20 years ago.). Breast Cancer Screening: recent mammogram for evaluation of red, painful breast, determined due to rupture of implant. Once insurance approves coverage, she will have it removed. Colorectal Cancer Screening: Colonoscopy 2013 with Dr. Collene Mares, reportedly normal. Repeat in 2023. Bone Density Testing: not yet a candidate HIV Screening: very low risk STI Screening: very low risk Seasonal Influenza Vaccination: recommend annually Td/Tdap Vaccination: 08/15/2011 Pneumococcal Vaccination: not yet a candidate Zoster Vaccination: not yet, due to cost Frequency of Dental evaluation: Q6 months Frequency of Eye evaluation: has probably been 5 years since last visit, Dr. Bing Plume, due to finances.  Skin lesions: 2 on the chest, one on the LEFT nasal bridge, one in the LEFT alar crease. Itchy. Not painful. No drainage. Present x 2 months. Not growing in size.  Frequent headaches.  Review of Systems As above. No chest pain, SOB, dizziness, vision change, N/V, diarrhea, constipation, dysuria, urinary urgency or frequency, myalgias, arthralgias.    Depression screen [facility] 2/9 06/10/2017 05/06/2017 12/11/2015  Decreased Interest 0 0 0  Down, Depressed, Hopeless 0 0 0  PHQ - 2 Score 0 0 0  Altered sleeping 0 - -  Tired, decreased energy 2 - -  Change in appetite 2 - -  Feeling bad or failure about yourself  0 - -  Trouble concentrating 0 - -  Moving slowly or fidgety/restless 0 - -  Suicidal thoughts 0 - -  Difficult doing work/chores Somewhat difficult - -    Patient  Active Problem List   Diagnosis Date Noted  . Ruptured right breast implant 05/27/2017  . Chronic bilateral thoracic back pain 05/27/2017  . Primary localized osteoarthritis of right hip 10/15/2016  . Primary osteoarthritis of right hip 10/15/2016  . Spondylolysis of cervical region 09/03/2016  . Lumbar spondylosis 09/03/2016  . Hip pain, chronic, right 09/03/2016  . Displacement of cervical intervertebral disc 09/03/2016  . Chest pain 12/19/2015  . Closed left ankle fracture 12/17/2015  . Essential (primary) hypertension 09/23/2013  . Obesity, unspecified 09/23/2013  . Intractable chronic migraine without aura 02/15/2011  . Migraine 02/14/2011  . Obesity 02/14/2011  . 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 Blindness of right eye 02/14/2011  . Depression 02/14/2011    Past Medical History:  Diagnosis Date  . Anxiety   . Arthritis   . Breast pain, right   . Cancer [facility])    ? breast, cervical   . Depression    situational  . Hypertension    not taken atenolol or lisinopril in >14mos, no money  . Migraines   . Pneumonia   . Ruptured disk      Prior to Admission medications   Medication Sig Start Date End Date Taking? Authorizing Provider  amLODipine (NORVASC) 5 MG tablet Take 1 tablet (5 mg total) by mouth daily. 05/06/17  Yes Harrison Mons, PA-C  aspirin EC 325 MG EC tablet Take 1 tablet (325 mg total) by mouth 2 (two) times daily after a meal. 10/18/16  Yes Loni Dolly, PA-C  bisacodyl (DULCOLAX) 5 MG EC tablet Take 1 tablet (5 mg total) by mouth daily as needed for moderate constipation. 10/18/16  Yes Loni Dolly, PA-C  buPROPion (WELLBUTRIN XL) 150 MG 24 hr tablet Take 1 tablet (150 mg total) by mouth daily. 05/06/17  Yes Kendyn Zaman, PA-C  Butalbital-APAP-Caffeine 50-300-40 MG CAPS Take 1 capsule by mouth every 4 (four) hours as needed (for migraine).   Yes [provider]  docusate sodium (COLACE) 100 MG capsule Take 1 capsule (100 mg  total) by mouth 2 (two) times daily. 10/18/16  Yes Loni Dolly, PA-C  DOXYLAMINE SUCCINATE PO Take 0.25 mg by mouth as needed (for sleep).   Yes [provider]  lidocaine (LIDODERM) 5 % Place 1 patch onto the skin daily. Remove & Discard patch within 12 hours or as directed by MD   Yes [provider]  Liniments (SALONPAS EX) Apply 3 patches topically daily.   Yes [provider]  meloxicam (MOBIC) 15 MG tablet Take 15 mg by mouth daily.   Yes [provider]  metaxalone (SKELAXIN) 800 MG tablet Take 1 tablet (800 mg total) by mouth 3 (three) times daily as needed for pain. 02/27/12  Yes Clenton Esper, PA-C  OVER THE COUNTER MEDICATION Take 0.25 each by mouth See admin instructions. [employer] CBD-Rich Hemp Oil 750-CBD - Take 1/4 dropper by mouth twice daily   Yes [provider]  oxyCODONE-acetaminophen (PERCOCET) 10-325 MG tablet Take 1-2 tablets by mouth every 4 (four) hours as needed for pain. 10/18/16  Yes Loni Dolly, PA-C  promethazine (PHENERGAN) 25 MG tablet Take 1 tablet (25 mg total) by mouth every 6 (six) hours as needed for nausea. 10/18/16  Yes Loni Dolly, PA-C  tiZANidine (ZANAFLEX) 4 MG tablet Take 1 tablet (4 mg total) by mouth every 6 (six) hours as needed for muscle spasms. 10/18/16 10/18/17 Yes Loni Dolly, PA-C  valACYclovir (VALTREX) 500 MG tablet Take 1 PO QD for suppression; INCREASE to BID x 3 days PRN outbreak 05/06/17  Yes Ginnifer Creelman, PA-C  atorvastatin (LIPITOR) 20 MG tablet Take 1 tablet (20 mg total) by mouth daily. Patient not taking: Reported on 06/10/2017 05/06/17   Harrison Mons, PA-C    Allergies  Allergen Reactions  . Effexor [Venlafaxine Hydrochloride] Shortness Of Breath, Rash and Other (See Comments)    HALLUCINATIONS  . Iodine Anaphylaxis  . Shellfish Allergy Anaphylaxis and Other (See Comments)  . Codeine Swelling and Rash    SWELLING REACTION UNSPECIFIED   . Flexeril [Cyclobenzaprine  Hcl] Other (See Comments)    HALLUCINATIONS AND "OUT OF CONTROL"  . Hydromorphone Other (See Comments)    HALLUCINATIONS  . Nubain [Nalbuphine Hcl] Other (See Comments)    HALLUCINATIONS  . Erythromycin Rash    Past Surgical History:  Procedure Laterality Date  . AUGMENTATION MAMMAPLASTY Right    1979  . BREAST SURGERY     lumpectomy in 20s for benign mass with implant placement (subglandular)  . CARPAL TUNNEL RELEASE     right wrist  . CARPAL TUNNEL RELEASE Right 09/02/2014   Procedure: RIGHT CARPAL TUNNEL RELEASE;  Surgeon: Melrose Nakayama, MD;  Location: [place];  Service: Orthopedics;  Laterality: Right;  . CERVICAL CONE BIOPSY    . COLONOSCOPY    . KNEE ARTHROSCOPY Left 07/1996  . KNEE ARTHROSCOPY Right 1998  . ORIF ANKLE FRACTURE Left 12/17/2015   Procedure: OPEN REDUCTION INTERNAL FIXATION (ORIF) ANKLE FRACTURE;  Surgeon: Milly Jakob, MD;  Location: [facility];  Service: Orthopedics;  Laterality: Left;  . SPINE SURGERY  72011   L2-L5 stabilization  . TOTAL HIP ARTHROPLASTY Right 10/15/2016  . TOTAL HIP ARTHROPLASTY Right 10/15/2016   Procedure: TOTAL HIP ARTHROPLASTY ANTERIOR APPROACH;  Surgeon: Melrose Nakayama, MD;  Location: [place];  Service: Orthopedics;  Laterality: Right;    Family History  Problem Relation Age of Onset  . Diabetes Mother   . Glaucoma Mother   . Cancer Mother        breast, ovarian  . Heart disease Mother   . Breast cancer Mother 32  . Gout Father   . Cancer Sister        ovarian, uterine cancer    Social History   Socioeconomic History  . Marital status: Widowed    Spouse name: Patrick Jupiter  . Number of children: 0  . Years of education: 93  . Highest education level: Some college, no degree  Occupational History    Employer: [employer]   Social Needs  . Financial resource strain: Somewhat hard  . Food insecurity:    Worry: Sometimes true    Inability: Sometimes true  . Transportation needs:    Medical: Yes     Non-medical: Yes  Tobacco Use  . Smoking status: Former Smoker    Years: 1.00    Types: Cigarettes    Last attempt to quit: 01/08/1976    Years since quitting: 41.4  . Smokeless tobacco: Never Used  Substance and Sexual Activity  . Alcohol use: No    Alcohol/week: 0.0 oz  . Drug use: No  . Sexual activity: Not on file  Lifestyle  . Physical activity:    Days per week: 0 days    Minutes per session: 0 min  . Stress: Rather much  Relationships  . Social connections:    Talks on phone: More than three times a week    Gets together: Never    Attends religious service: Never    Active member of club or organization: Yes    Attends meetings of clubs or organizations: More than 4 times per year    Relationship status: Widowed  Other Topics Concern  . Not on file  Social History Narrative   Husband died 10/29/11 of end-stage COPD.  Adversarial relationship with his ex-wife who lives in a house he was paying for.  Attorneys for both parties are involved in attempt to settle the conflict and financial matters of his will.        Objective:  Physical Exam  Constitutional: She is oriented to person, place, and time. Vital signs are normal. She appears well-developed and well-nourished. She is active and cooperative. No distress.  BP 138/84   Pulse 91   Temp 99 F (37.2 C)   Resp 16   Ht 5' 8.5" (1.74 m)   Wt 240 lb 9.6 oz (109.1 kg)   SpO2 99%   BMI 36.05 kg/m    HENT:  Head: Normocephalic and atraumatic.  Right Ear: Hearing, tympanic membrane, external ear and ear canal normal. No foreign bodies.  Left Ear: Hearing, tympanic membrane, external ear and ear canal normal. No foreign bodies.  Nose: Nose normal.  Mouth/Throat: Uvula is midline, oropharynx is clear and moist and mucous membranes are normal. No oral lesions. Normal dentition. No dental abscesses or uvula swelling. No oropharyngeal exudate.  Eyes: Pupils are equal, round, and reactive to light. Conjunctivae, EOM and  lids are normal. Right eye exhibits no discharge. Left eye exhibits  no discharge. No scleral icterus.  Fundoscopic exam:      The right eye shows no arteriolar narrowing, no AV nicking, no exudate, no hemorrhage and no papilledema. The right eye shows red reflex.       The left eye shows no arteriolar narrowing, no AV nicking, no exudate, no hemorrhage and no papilledema. The left eye shows red reflex.  Neck: Trachea normal, normal range of motion and full passive range of motion without pain. Neck supple. No spinous process tenderness and no muscular tenderness present. No thyroid mass and no thyromegaly present.  Cardiovascular: Normal rate, regular rhythm, normal heart sounds, intact distal pulses and normal pulses.  Pulmonary/Chest: Effort normal and breath sounds normal.  Abdominal: Hernia confirmed negative in the right inguinal area and confirmed negative in the left inguinal area.  Genitourinary: Vagina normal. Rectal exam shows no external hemorrhoid and no fissure. Pelvic exam was performed with patient supine. No labial fusion. There is no rash, tenderness, lesion or injury on the right labia. There is no rash, tenderness, lesion or injury on the left labia. Uterus is not deviated, not enlarged, not fixed and not tender. Cervix exhibits no motion tenderness, no discharge and no friability. Right adnexum displays no mass, no tenderness and no fullness. Left adnexum displays no mass, no tenderness and no fullness. Tenderness: vaginismus.  Genitourinary Comments: Os is stenotic, making collection of endocervical cells difficult  Musculoskeletal: She exhibits no edema or tenderness.       Cervical back: Normal.       Thoracic back: Normal.       Lumbar back: Normal.  Lymphadenopathy:       Head (right side): No tonsillar, no preauricular, no posterior auricular and no occipital adenopathy present.       Head (left side): No tonsillar, no preauricular, no posterior auricular and no occipital  adenopathy present.    She has no cervical adenopathy. No inguinal adenopathy noted on the right or left side.       Right: No supraclavicular adenopathy present.       Left: No supraclavicular adenopathy present.  Neurological: She is alert and oriented to person, place, and time. She has normal strength and normal reflexes. No cranial nerve deficit. She exhibits normal muscle tone. Coordination and gait normal.  Skin: Skin is warm, dry and intact. No rash noted. She is not diaphoretic. No cyanosis or erythema. Nails show no clubbing.  Psychiatric: She has a normal mood and affect. Her speech is normal and behavior is normal. Judgment and thought content normal.     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 Marla Roe.      Hyperlipidemia    Intolerant to atorvastatin. Last LDL 101. Work on healthy lifestyle changes.      Essential (primary) hypertension    Not well controlled initially, but significantly improved on recheck. Continue amlodipine 5 mg.      Relevant Orders   Care order/instruction:    Other Visit  Diagnoses    Annual physical exam    -  Primary   Age appropriate health guidance provided.   Screening for cervical cancer       If cytology satisfactory and negative, and HPV negative, repeat co-testing in 5 years   Relevant Orders   Pap IG and HPV (high risk) DNA detection       No follow-ups on file.   Fara Chute, PA-C [facility]

## 2017-06-10 NOTE — Assessment & Plan Note (Signed)
Not well controlled initially, but significantly improved on recheck. Continue amlodipine 5 mg.

## 2017-06-10 NOTE — Patient Instructions (Addendum)
Try Peppermint oil dabbed behind your ears to help cool you off with hot flashes. You can also try Black Cohosh to prevent hot flashes.    IF you received an x-ray today, you will receive an invoice from [facility]. Please contact [facility] at [phone] with questions or concerns regarding your invoice.   IF you received labwork today, you will receive an invoice from [facility]. Please contact [facility] at [phone] with questions or concerns regarding your invoice.   Our billing staff will not be able to assist you with questions regarding bills from these companies.  You will be contacted with the lab results as soon as they are available. The fastest way to get your results is to activate your My Chart account. Instructions are located on the last page of this paperwork. If you have not heard from [place] regarding the results in 2 weeks, please contact this office.      Preventive Care 40-64 Years, Female Preventive care refers to lifestyle choices and visits with your health care provider that can promote health and wellness. What does preventive care include?  A yearly physical exam. This is also called an annual well check.  Dental exams once or twice a year.  Routine eye exams. Ask your health care provider how often you should have your eyes checked.  Personal lifestyle choices, including: ? Daily care of your teeth and gums. ? Regular physical activity. ? Eating a healthy diet. ? Avoiding tobacco and drug use. ? Limiting alcohol use. ? Practicing safe sex. ? Taking low-dose aspirin daily starting at age 2. ? Taking vitamin and mineral supplements as recommended by your health care provider. What happens during an annual well check? The services and screenings done by your health care provider during your annual well check will depend on your age, overall health, lifestyle risk factors, and family history of disease. Counseling Your health care  provider may ask you questions about your:  Alcohol use.  Tobacco use.  Drug use.  Emotional well-being.  Home and relationship well-being.  Sexual activity.  Eating habits.  Work and work Statistician.  Method of birth control.  Menstrual cycle.  Pregnancy history.  Screening You may have the following tests or measurements:  Height, weight, and BMI.  Blood pressure.  Lipid and cholesterol levels. These may be checked every 5 years, or more frequently if you are over 66 years old.  Skin check.  Lung cancer screening. You may have this screening every year starting at age 83 if you have a 30-pack-year history of smoking and currently smoke or have quit within the past 15 years.  Fecal occult blood test (FOBT) of the stool. You may have this test every year starting at age 79.  Flexible sigmoidoscopy or colonoscopy. You may have a sigmoidoscopy every 5 years or a colonoscopy every 10 years starting at age 18.  Hepatitis C blood test.  Hepatitis B blood test.  Sexually transmitted disease (STD) testing.  Diabetes screening. This is done by checking your blood sugar (glucose) after you have not eaten for a while (fasting). You may have this done every 1-3 years.  Mammogram. This may be done every 1-2 years. Talk to your health care provider about when you should start having regular mammograms. This may depend on whether you have a family history of breast cancer.  BRCA-related cancer screening. This may be done if you have a family history of breast, ovarian, tubal, or peritoneal cancers.  Pelvic exam and  Pap test. This may be done every 3 years starting at age 28. Starting at age 6, this may be done every 5 years if you have a Pap test in combination with an HPV test.  Bone density scan. This is done to screen for osteoporosis. You may have this scan if you are at high risk for osteoporosis.  Discuss your test results, treatment options, and if necessary, the  need for more tests with your health care provider. Vaccines Your health care provider may recommend certain vaccines, such as:  Influenza vaccine. This is recommended every year.  Tetanus, diphtheria, and acellular pertussis (Tdap, Td) vaccine. You may need a Td booster every 10 years.  Varicella vaccine. You may need this if you have not been vaccinated.  Zoster vaccine. You may need this after age 69.  Measles, mumps, and rubella (MMR) vaccine. You may need at least one dose of MMR if you were born in 1957 or later. You may also need a second dose.  Pneumococcal 13-valent conjugate (PCV13) vaccine. You may need this if you have certain conditions and were not previously vaccinated.  Pneumococcal polysaccharide (PPSV23) vaccine. You may need one or two doses if you smoke cigarettes or if you have certain conditions.  Meningococcal vaccine. You may need this if you have certain conditions.  Hepatitis A vaccine. You may need this if you have certain conditions or if you travel or work in places where you may be exposed to hepatitis A.  Hepatitis B vaccine. You may need this if you have certain conditions or if you travel or work in places where you may be exposed to hepatitis B.  Haemophilus influenzae type b (Hib) vaccine. You may need this if you have certain conditions.  Talk to your health care provider about which screenings and vaccines you need and how often you need them. This information is not intended to replace advice given to you by your health care provider. Make sure you discuss any questions you have with your health care provider. Document Released: 01/20/2015 Document Revised: 09/13/2015 Document Reviewed: 10/25/2014 Elsevier Interactive Patient Education  [employer].

## 2017-06-10 NOTE — Assessment & Plan Note (Signed)
Proceed with planned ruptured implant removal with Dr. Marla Roe.

## 2017-06-10 NOTE — Assessment & Plan Note (Signed)
Intolerant to atorvastatin. Last LDL 101. Work on healthy lifestyle changes.

## 2017-06-12 LAB — PAP IG AND HPV HIGH-RISK
HPV, HIGH-RISK: NEGATIVE
PAP Smear Comment: 0

## 2017-07-01 ENCOUNTER — Telehealth: Payer: Self-pay | Admitting: Family Medicine

## 2017-07-01 NOTE — Telephone Encounter (Signed)
Copied from [place] [id number]. Topic: Referral - Question >> May 21, 2017  9:48 AM Neva Seat wrote: [facility] [phone]  Please call to give them more needing info on pt. >> Jul 01, 2017  1:02 PM Boyd Kerbs wrote: Pt. Is needing to have doctor call her about her implant that ruptured.   Needing to see how long this can be with out reconstructive surgery, as insurance won't pay.   It is already hardening.   This was through [facility]  -----------------------------------------------------------------------------------------  Please advise.

## 2017-07-01 NOTE — Telephone Encounter (Signed)
Copied from [place] [id number]. Topic: Referral - Question >> May 21, 2017  9:48 AM Neva Seat wrote: [facility] [phone]  Please call to give them more needing info on pt. >> Jul 01, 2017  1:02 PM Boyd Kerbs wrote: Pt. Is needing to have doctor call her about her implant that ruptured.   Needing to see how long this can be with out reconstructive surgery, as insurance won't pay.   It is already hardening.   This was through [employer]

## 2017-07-02 NOTE — Telephone Encounter (Signed)
I am very sorry but that is information I do not know. I advise she seek advice from her plastic surgeon. Thanks

## 2017-07-03 NOTE — Telephone Encounter (Signed)
Spoke with pt.  She has no resolution yet.  Surgeon at [facility] has left. WF states she needs a PA from [facility] and canceled scheduled surgery on 07/23/2017 Pt doesn't know how to do PA.  Advised pt to call [facility] and ask for director of that department and inquire as to if a PA has been started and get details. Normal practice would be for the surgeon office to acquire the PA.

## 2017-08-22 ENCOUNTER — Other Ambulatory Visit

## 2018-02-03 IMAGING — CR DG ANKLE COMPLETE 3+V*L*
3 series · 3 of 3 positions shown · non-contrast
Comparison: None.

CLINICAL DATA: Slip and fall on ice with left ankle pain, edema and
deformity.

EXAM:
LEFT ANKLE COMPLETE - 3+ VIEW

[x ankle ap left]
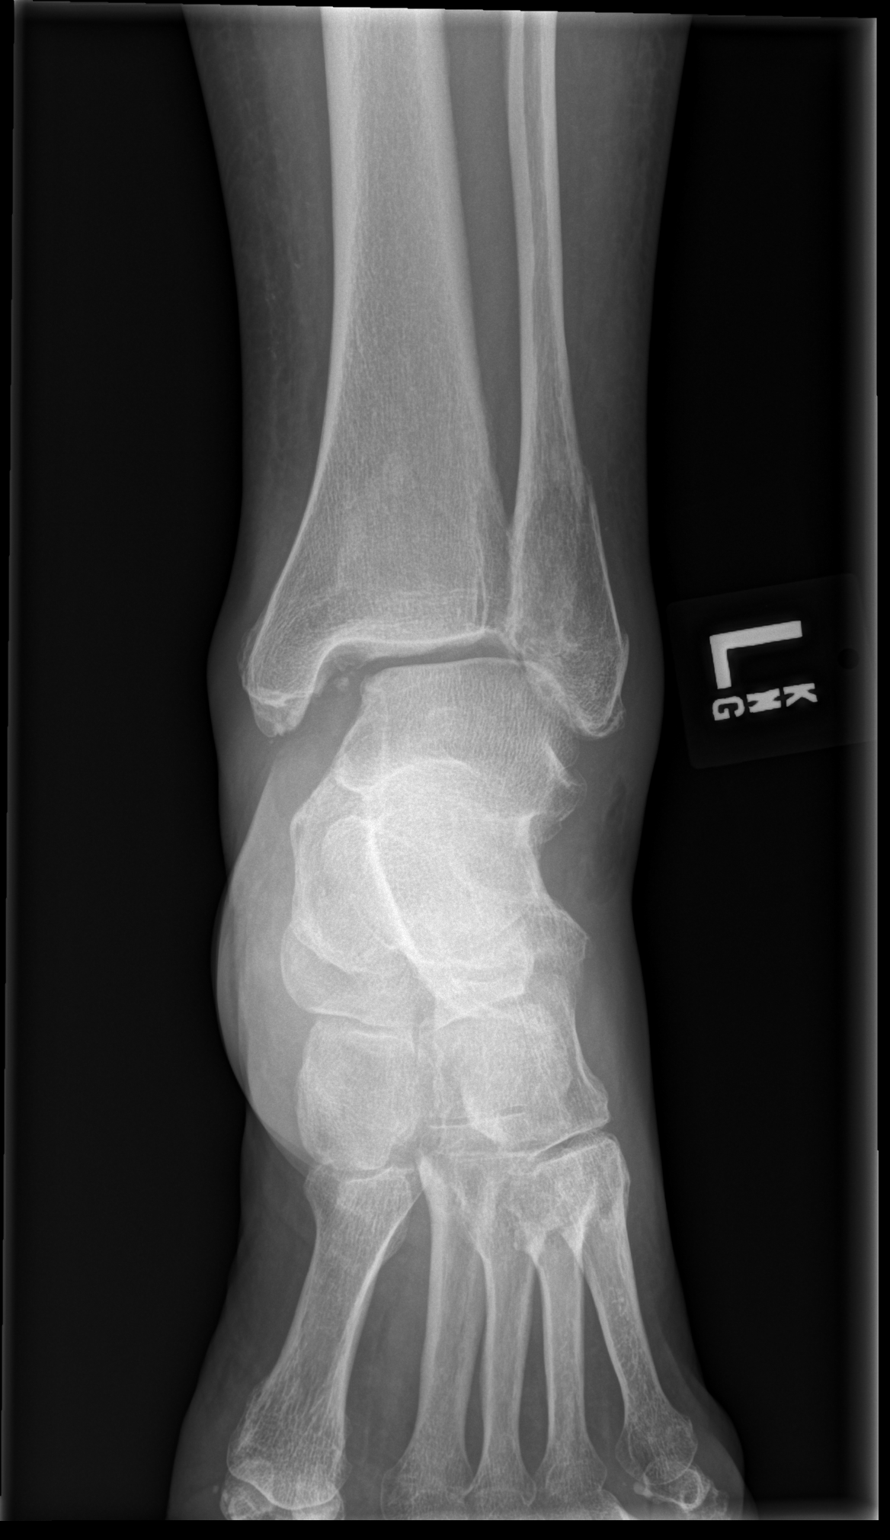

[x ankle obl left]
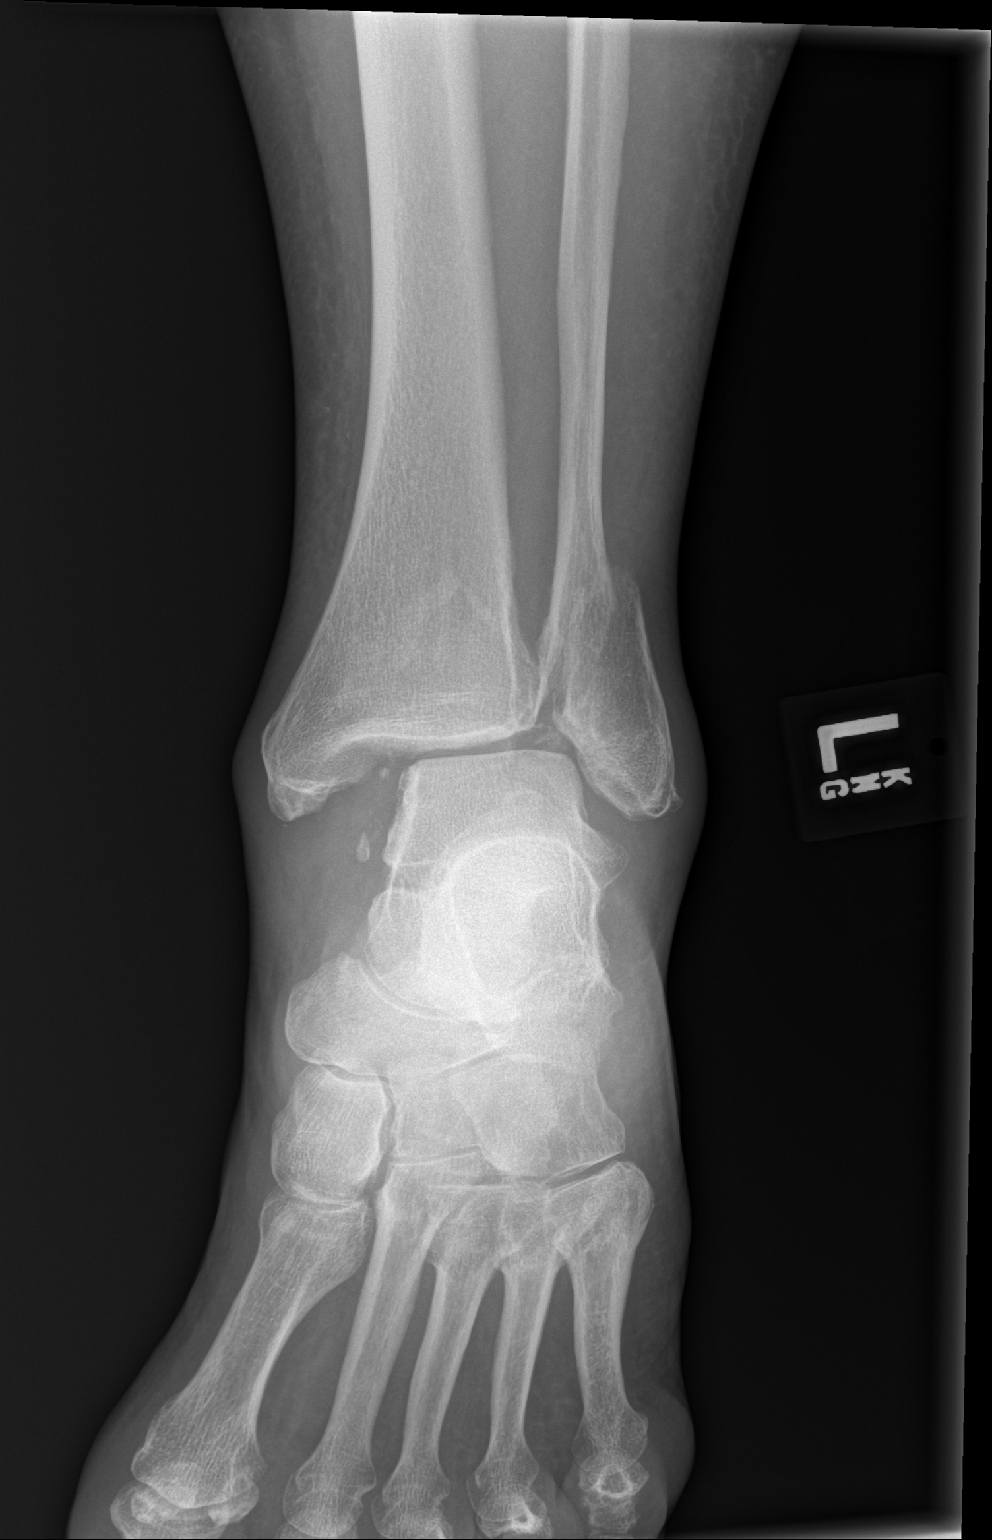

[x ankle lat left]
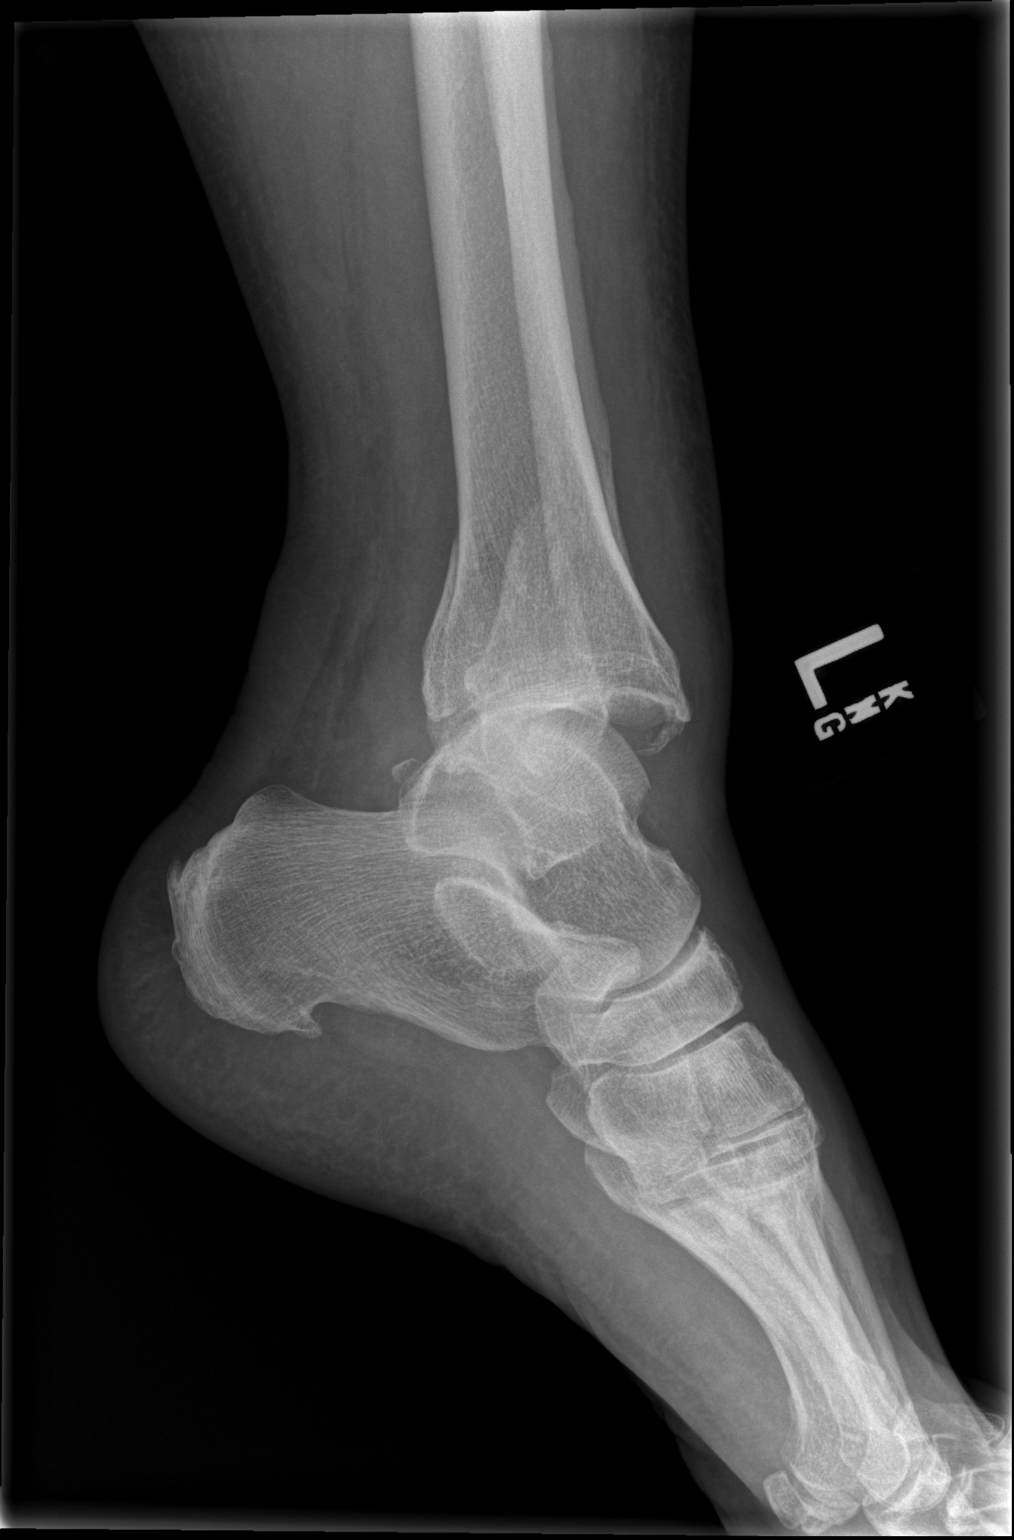

[3 of 3 positions shown; findings below may reference images not displayed]

FINDINGS: Oblique mildly displaced distal fibular fracture just proximal to
the ankle mortise. There is a mildly displaced posterior tibial
tubercle fracture. Marked widening of the medial clear space
consistent with ligamentous injury. Diffuse soft tissue edema.
IMPRESSION: Trimalleolar injury with displaced fractures of the distal fibula
and posterior tibial tubercle. Widening of the medial clear space is
consistent with ligamentous injury, medial malleolus fracture
component is not seen.

## 2018-02-04 IMAGING — RF DG ANKLE 2V *L*
1 series · 3 of 3 positions shown · non-contrast
Comparison: December 16, 2015

CLINICAL DATA: Surgery.

FLUOROSCOPY TIME:  20 seconds.
Images: 3
EXAM:
LEFT ANKLE - 2 VIEW

[Series 1: run · 3 of 3 slices shown]
[im 1/3]
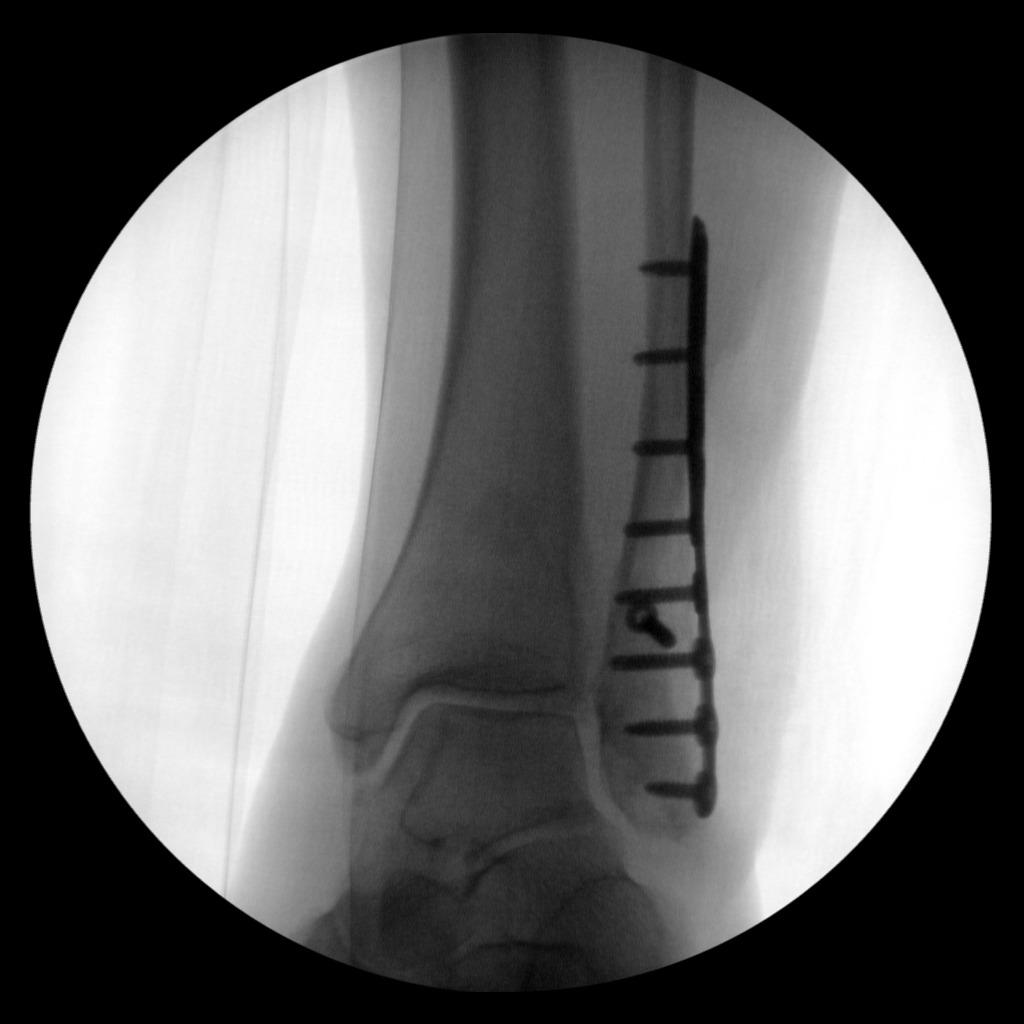
[im 2/3]
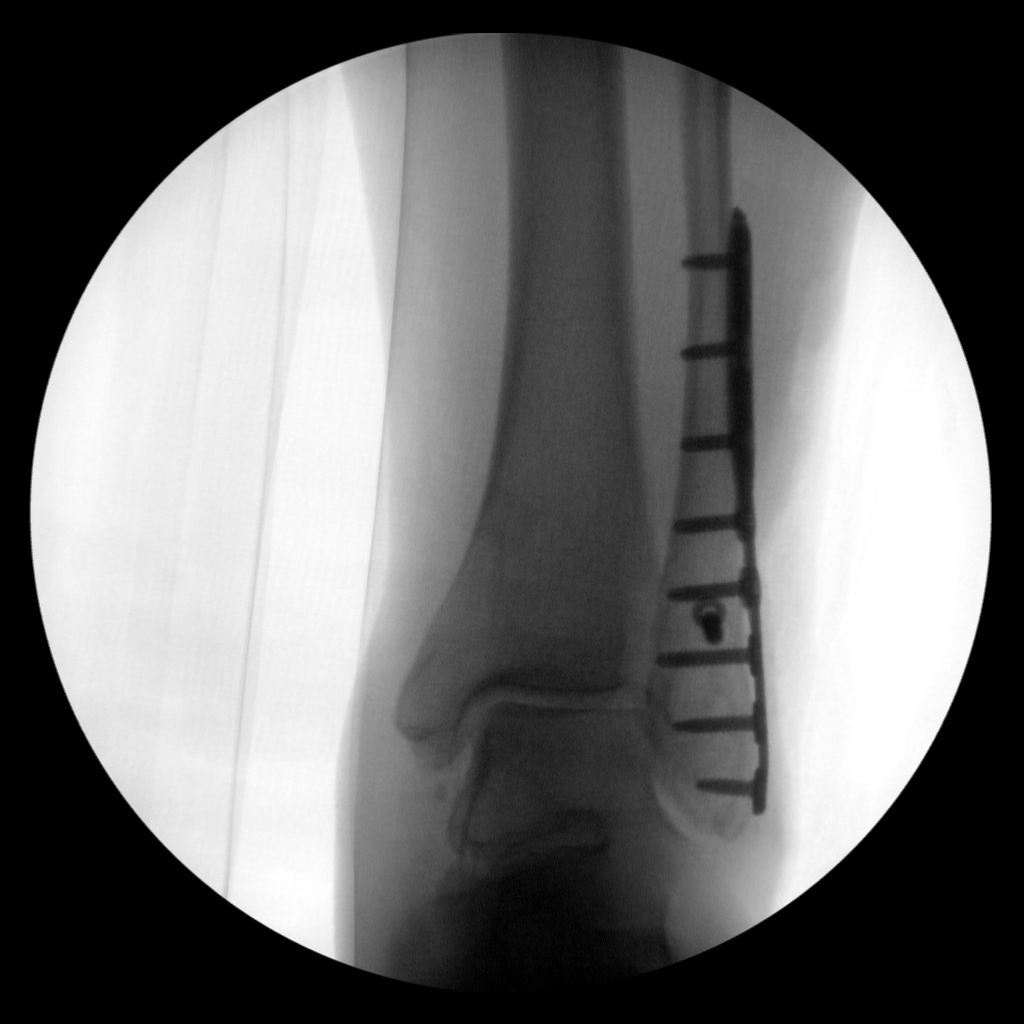
[im 3/3]
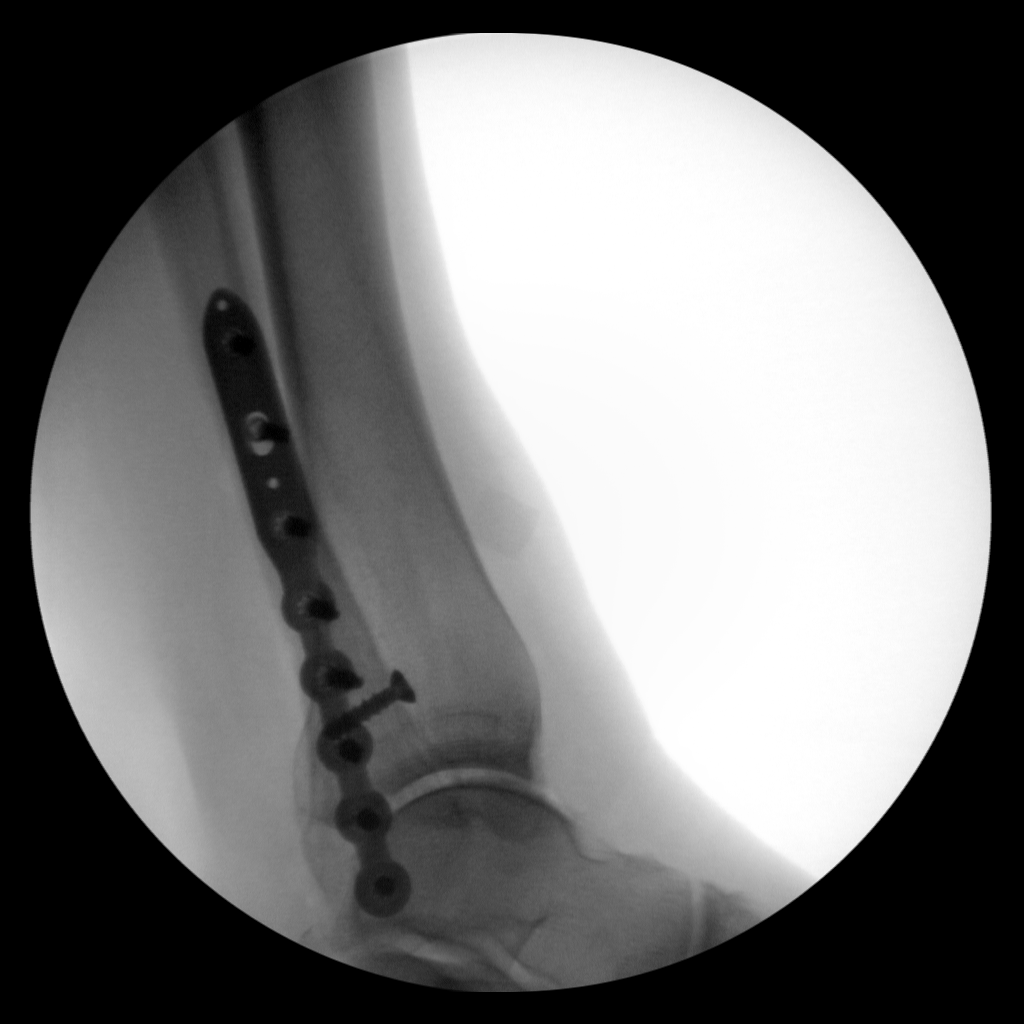

[3 of 3 positions shown; findings below may reference images not displayed]

FINDINGS: A plate is now affixed to the fibula across the known fracture.
Alignment of the ankle mortise is improved in the interval.
IMPRESSION: Interval fibular fracture repair as above.

## 2018-09-30 ENCOUNTER — Ambulatory Visit (INDEPENDENT_AMBULATORY_CARE_PROVIDER_SITE_OTHER): Admitting: Neurology

## 2018-09-30 ENCOUNTER — Other Ambulatory Visit: Payer: Self-pay

## 2018-09-30 ENCOUNTER — Encounter: Payer: Self-pay | Admitting: Neurology

## 2018-09-30 VITALS — BP 183/91 | HR 96 | Temp 96.9°F | Ht 68.0 in | Wt 213.0 lb

## 2018-09-30 DIAGNOSIS — G43111 Migraine with aura, intractable, with status migrainosus: Secondary | ICD-10-CM | POA: Diagnosis not present

## 2018-09-30 DIAGNOSIS — G43711 Chronic migraine without aura, intractable, with status migrainosus: Secondary | ICD-10-CM

## 2018-09-30 MED ORDER — EMGALITY 120 MG/ML ~~LOC~~ SOAJ: 240.0000 mg | Freq: Once | SUBCUTANEOUS | 0 refills | Status: AC

## 2018-09-30 NOTE — Patient Instructions (Signed)
Botox for migraines Galcanezumab injection What is this medicine? GALCANEZUMAB (gal ka NEZ ue mab) is used to prevent migraines and treat cluster headaches. This medicine may be used for other purposes; ask your health care provider or pharmacist if you have questions. COMMON BRAND NAME(S): Emgality What should I tell my health care provider before I take this medicine? They need to know if you have any of these conditions:  an unusual or allergic reaction to galcanezumab, other medicines, foods, dyes, or preservatives  pregnant or trying to get pregnant  breast-feeding How should I use this medicine? This medicine is for injection under the skin. You will be taught how to prepare and give this medicine. Use exactly as directed. Take your medicine at regular intervals. Do not take your medicine more often than directed. It is important that you put your used needles and syringes in a special sharps container. Do not put them in a trash can. If you do not have a sharps container, call your pharmacist or healthcare provider to get one. Talk to your pediatrician regarding the use of this medicine in children. Special care may be needed. Overdosage: If you think you have taken too much of this medicine contact a poison control center or emergency room at once. NOTE: This medicine is only for you. Do not share this medicine with others. What if I miss a dose? If you miss a dose, take it as soon as you can. If it is almost time for your next dose, take only that dose. Do not take double or extra doses. What may interact with this medicine? Interactions are not expected. This list may not describe all possible interactions. Give your health care provider a list of all the medicines, herbs, non-prescription drugs, or dietary supplements you use. Also tell them if you smoke, drink alcohol, or use illegal drugs. Some items may interact with your medicine. What should I watch for while using this  medicine? Tell your doctor or healthcare professional if your symptoms do not start to get better or if they get worse. What side effects may I notice from receiving this medicine? Side effects that you should report to your doctor or health care professional as soon as possible:  allergic reactions like skin rash, itching or hives, swelling of the face, lips, or tongue Side effects that usually do not require medical attention (report these to your doctor or health care professional if they continue or are bothersome):  pain, redness, or irritation at site where injected This list may not describe all possible side effects. Call your doctor for medical advice about side effects. You may report side effects to FDA at 1-800-FDA-1088. Where should I keep my medicine? Keep out of the reach of children. You will be instructed on how to store this medicine. Throw away any unused medicine after the expiration date on the label. NOTE: This sheet is a summary. It may not cover all possible information. If you have questions about this medicine, talk to your doctor, pharmacist, or health care provider.  2020 Elsevier/Gold Standard (2017-06-11 12:03:23)

## 2018-09-30 NOTE — Progress Notes (Signed)
[facility]    Provider:  Dr Jaynee Eagles Requesting Provider: Jeanella Anton, NP (manages her pain) Primary Care Provider:  Harrison Mons, PA  CC:  Migraine  HPI:  Sherri [name] is a 63 y.o. female here as requested by Jeanella Anton, NP for migraines.  Past medical history of migraine headaches, lumbosacral pain chronic, cervical spondylosis, concussion, herniated cervical disc, hypertension, chronic pain on long-term use of opioids, chronic pain syndrome. Mother and grandmother have migraines. Her brother and sister have migraines. She has daily headaches. She has severe migraines, they are behind the eyes and back of the head, pulsating/pounding/throbbing, photophobia/phonophobia, movement makes it worse, nausea, vomiting, ice packs may help, she takes fioricet monthly, also on percocet every day. She has tried Botox, ergots. Imitrex, she can have migraines without aura also migraines with aura. She has tried topamax, muscle relaxers (flexeril, skelaxin), amitriptyline, nortriptyline, verapamil, depakote, robaxin, nerve blocks, prednisone, reglan injections, zofran injections, phenergan, tizanidine, paxil, and many others. Worsening over the last year, ongoing at this severity for and frequency for years, no snoring, no other signs of sleep apnea or symptoms.   Reviewed notes, labs and imaging from outside physicians, which showed:   I reviewed notes.  Patient is on chronic opioid management for back pain.  Pain is 9 out of 10 in the low back, it is aching, constant, daily for hours.  Stiffness and decreased range of motion.  She is on an opioid contract.  She has a history of chronic migraines and she was referred to [place] for management of this disorder.  Migraines for 38 years since the age of 60.  Dr. Carloyn Manner treated her migraines and had back surgery.    Review of Systems: Patient complains of symptoms per HPI as well as the following symptoms: chronic pain. Pertinent  negatives and positives per HPI. All others negative.   Social History   Socioeconomic History  . Marital status: Widowed    Spouse name: Patrick Jupiter  . Number of children: 0  . Years of education: 57  . Highest education level: Some college, no degree  Occupational History    Employer: [employer]   Social Needs  . Financial resource strain: Somewhat hard  . Food insecurity    Worry: Sometimes true    Inability: Sometimes true  . Transportation needs    Medical: Yes    Non-medical: Yes  Tobacco Use  . Smoking status: Former Smoker    Years: 1.00    Types: Cigarettes    Quit date: 01/08/1976    Years since quitting: 42.7  . Smokeless tobacco: Never Used  Substance and Sexual Activity  . Alcohol use: No    Alcohol/week: 0.0 standard drinks  . Drug use: No  . Sexual activity: Not on file  Lifestyle  . Physical activity    Days per week: 0 days    Minutes per session: 0 min  . Stress: Rather much  Relationships  . Social connections    Talks on phone: More than three times a week    Gets together: Never    Attends religious service: Never    Active member of club or organization: Yes    Attends meetings of clubs or organizations: More than 4 times per year    Relationship status: Widowed  . Intimate partner violence    Fear of current or ex partner: No    Emotionally abused: No    Physically abused: No    Forced sexual activity: No  Other Topics Concern  . Not on file  Social History Narrative   Husband died 10/10/11 of end-stage COPD.  Adversarial relationship with his ex-wife who lives in a house he was paying for.  Attorneys for both parties are involved in attempt to settle the conflict and financial matters of his will.         Update 09/30/2018   Right handed   Caffeine: 1 cup of coffee/day   Lives alone    Family History  Problem Relation Age of Onset  . Diabetes Mother   . Glaucoma Mother   . Cancer Mother        breast, ovarian  . Heart disease  Mother   . Breast cancer Mother 32  . Migraines Mother   . Gout Father   . Cancer Sister        ovarian, uterine cancer  . Migraines Sister   . Migraines Brother   . Migraines Maternal Grandmother     Past Medical History:  Diagnosis Date  . Anxiety   . Arthritis   . Breast pain, right   . Cancer [facility])    ? breast, cervical   . Closed left ankle fracture 12/17/2015  . Depression    situational  . Hypertension    not taken atenolol or lisinopril in >66mos, no money  . Migraines   . Pneumonia   . Ruptured disk     Patient Active Problem List   Diagnosis Date Noted  . Chronic migraine without aura, with intractable migraine, so stated, with status migrainosus 10-Oct-2018  . NSTEMI (non-ST elevated myocardial infarction) ([place]) 10/02/2018  . Migraines   . Hyperlipidemia 06/10/2017  . Prediabetes 06/10/2017  . Ruptured right breast implant 05/27/2017  . Chronic bilateral thoracic back pain 05/27/2017  . Primary localized osteoarthritis of right hip 10/15/2016  . Spondylolysis of cervical region 09/03/2016  . Lumbar spondylosis 09/03/2016  . Displacement of cervical intervertebral disc 09/03/2016  . Chest pain 12/19/2015  . Essential (primary) hypertension 09/23/2013  . Obesity, unspecified 09/23/2013  . Intractable chronic migraine without aura 02/15/2011  . Obesity 02/14/2011  . 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 Blindness of right eye 02/14/2011  . Depression 02/14/2011    Past Surgical History:  Procedure Laterality Date  . AUGMENTATION MAMMAPLASTY Right    1979  . BREAST SURGERY     lumpectomy in 20s for benign mass with implant placement (subglandular)  . CARPAL TUNNEL RELEASE     right wrist  . CARPAL TUNNEL RELEASE Right 09/02/2014   Procedure: RIGHT CARPAL TUNNEL RELEASE;  Surgeon: Melrose Nakayama, MD;  Location: [place];  Service: Orthopedics;  Laterality: Right;  . CERVICAL CONE BIOPSY    . COLONOSCOPY    . CORONARY  STENT INTERVENTION N/A 10/02/2018   Procedure: CORONARY STENT INTERVENTION;  Surgeon: Nelva Bush, MD;  Location: [place] [facility];  Service: Cardiovascular;  Laterality: N/A;  . KNEE ARTHROSCOPY Left 07/1996  . KNEE ARTHROSCOPY Right 1998  . LEFT HEART CATH AND CORONARY ANGIOGRAPHY N/A 10/02/2018   Procedure: LEFT HEART CATH AND CORONARY ANGIOGRAPHY;  Surgeon: Nelva Bush, MD;  Location: [place] [facility];  Service: Cardiovascular;  Laterality: N/A;  . ORIF ANKLE FRACTURE Left 12/17/2015   Procedure: OPEN REDUCTION INTERNAL FIXATION (ORIF) ANKLE FRACTURE;  Surgeon: Milly Jakob, MD;  Location: [facility];  Service: Orthopedics;  Laterality: Left;  . SPINE SURGERY  72011   L2-L5 stabilization  . TOTAL HIP ARTHROPLASTY Right  10/15/2016  . TOTAL HIP ARTHROPLASTY Right 10/15/2016   Procedure: TOTAL HIP ARTHROPLASTY ANTERIOR APPROACH;  Surgeon: Melrose Nakayama, MD;  Location: [place];  Service: Orthopedics;  Laterality: Right;    Current Outpatient Medications  Medication Sig Dispense Refill  . buPROPion (WELLBUTRIN XL) 150 MG 24 hr tablet Take 1 tablet (150 mg total) by mouth daily. 90 tablet 3  . Butalbital-APAP-Caffeine 50-325-40 MG capsule Take 1 capsule by mouth every 6 (six) hours as needed for headache.     . docusate sodium (COLACE) 100 MG capsule Take 1 capsule (100 mg total) by mouth 2 (two) times daily. 30 capsule 0  . metaxalone (SKELAXIN) 800 MG tablet Take 1 tablet (800 mg total) by mouth 3 (three) times daily as needed for pain. 90 tablet 3  . oxyCODONE-acetaminophen (PERCOCET) 10-325 MG tablet Take 1-2 tablets by mouth every 4 (four) hours as needed for pain. 40 tablet 0  . valACYclovir (VALTREX) 500 MG tablet Take 1 PO QD for suppression; INCREASE to BID x 3 days PRN outbreak (Patient taking differently: Take 500 mg by mouth daily. ) 100 tablet 3  . [START ON 10/04/2018] amLODipine (NORVASC) 10 MG tablet Take 1 tablet (10 mg total) by mouth daily. 30 tablet 0  . [START ON  10/04/2018] aspirin EC 81 MG EC tablet Take 1 tablet (81 mg total) by mouth daily. 30 tablet 2  . Erenumab-aooe (AIMOVIG [place]) Inject into the skin every 30 (thirty) days. Unknown dose Given at Neurologist    . [START ON 10/04/2018] lisinopril (ZESTRIL) 10 MG tablet Take 1 tablet (10 mg total) by mouth daily. 30 tablet 0  . metoprolol tartrate (LOPRESSOR) 25 MG tablet Take 1 tablet (25 mg total) by mouth 2 (two) times daily. 60 tablet 0  . nitroGLYCERIN (NITROSTAT) 0.4 MG SL tablet Place 1 tablet (0.4 mg total) under the tongue every 5 (five) minutes as needed for chest pain. 15 tablet 0  . pravastatin (PRAVACHOL) 20 MG tablet Take 1 tablet (20 mg total) by mouth daily at 6 PM. 30 tablet 0  . ticagrelor (BRILINTA) 90 MG TABS tablet Take 1 tablet (90 mg total) by mouth 2 (two) times daily. 60 tablet 0   No current facility-administered medications for this visit.     Allergies as of 09/30/2018 - Review Complete 09/30/2018  Allergen Reaction Noted  . Effexor [venlafaxine hydrochloride] Shortness Of Breath, Rash, and Other (See Comments) 02/14/2011  . Iodine Anaphylaxis 07/01/2012  . Shellfish allergy Anaphylaxis and Other (See Comments) 02/14/2011  . Codeine Swelling and Rash 02/14/2011  . Flexeril [cyclobenzaprine hcl] Other (See Comments) 02/14/2011  . Hydromorphone Other (See Comments) 12/11/2015  . Nubain [nalbuphine hcl] Other (See Comments) 02/14/2011  . Atorvastatin Other (See Comments) 06/10/2017  . Erythromycin Rash 02/14/2011    Vitals: BP (!) 183/91 (BP Location: Right Arm, Patient Position: Sitting)   Pulse 96   Temp (!) 96.9 F (36.1 C) Comment: taken by front staff  Ht 5\' 8"  (1.727 m)   Wt 213 lb (96.6 kg)   BMI 32.39 kg/m  Last Weight:  Wt Readings from Last 1 Encounters:  10/03/18 210 lb 4.8 oz (95.4 kg)   Last Height:   Ht Readings from Last 1 Encounters:  10/02/18 5\' 8"  (1.727 m)     Physical exam: Exam: Gen: NAD, conversant, well nourised, obese, well  groomed                     CV: RRR, no MRG.  No Carotid Bruits. No peripheral edema, warm, nontender Eyes: Conjunctivae clear without exudates or hemorrhage  Neuro: Detailed Neurologic Exam  Speech:    Speech is normal; fluent and spontaneous with normal comprehension.  Cognition:    The patient is oriented to person, place, and time;     recent and remote memory intact;     language fluent;     normal attention, concentration,     fund of knowledge Cranial Nerves:    The pupils are equal, round, and reactive to light. The fundi are normal and spontaneous venous pulsations are present. Visual fields are full to finger confrontation. Extraocular movements are intact. Trigeminal sensation is intact and the muscles of mastication are normal. The face is symmetric. The palate elevates in the midline. Hearing intact. Voice is normal. Shoulder shrug is normal. The tongue has normal motion without fasciculations.   Coordination:    Normal finger to nose and heel to shin. Normal rapid alternating movements.   Gait:    Normal native gait  Motor Observation:    No asymmetry, no atrophy, and no involuntary movements noted. Tone:    Normal muscle tone.    Posture:    Posture is normal. normal erect    Strength:    Strength is V/V in the upper and lower limbs.      Sensation: intact to LT     Reflex Exam:  DTR's:    Deep tendon reflexes in the upper and lower extremities are symmetrical bilaterally.   Toes:    The toes are equiv bilaterally.   Clonus:    Clonus is absent.    Assessment/Plan:   63 y.o. female here as requested by Jeanella Anton, NP for migraines.  Past medical history of migraine headaches, lumbosacral pain chronic, cervical spondylosis, concussion, herniated cervical disc, hypertension, chronic pain on long-term use of opioids, chronic pain syndrome. Chronic migraines with a significant family history.  - I don't recommend Fioricet but she can take 10-15 a  month, discussed this can cause rebound headaches but she has failed multiple acute medications and says this is only thing that works. Still she has to go through [employer] since she has a pain contract and Fioricet is a controlled substance and can interact with oxycodone; needs one prescriber for all controlled possibly sedating medications. She has constipation aimovig contraindicated. Would try Botox and/or Emgality - Botox for migraines. Failed multiple meds. polypharmacy, do not want to add another oral medication. - If we have trouble getting VA approval can use samples for first botox injection. - Discussed Emgality as well, will start, in addition to Botox - with and without aura, discussed stroke risks  Discussed: To prevent or relieve headaches, try the following: Cool Compress. Lie down and place a cool compress on your head.  Avoid headache triggers. If certain foods or odors seem to have triggered your migraines in the past, avoid them. A headache diary might help you identify triggers.  Include physical activity in your daily routine. Try a daily walk or other moderate aerobic exercise.  Manage stress. Find healthy ways to cope with the stressors, such as delegating tasks on your to-do list.  Practice relaxation techniques. Try deep breathing, yoga, massage and visualization.  Eat regularly. Eating regularly scheduled meals and maintaining a healthy diet might help prevent headaches. Also, drink plenty of fluids.  Follow a regular sleep schedule. Sleep deprivation might contribute to headaches Consider biofeedback. With this mind-body technique, you learn to control certain  bodily functions - such as muscle tension, heart rate and blood pressure - to prevent headaches or reduce headache pain.    Proceed to emergency room if you experience new or worsening symptoms or symptoms do not resolve, if you have new neurologic symptoms or if headache is severe, or for any concerning  symptom.   Provided education and documentation from American headache Society toolbox including articles on: chronic migraine medication overuse headache, chronic migraines, prevention of migraines, behavioral and other nonpharmacologic treatments for headache.    Meds ordered this encounter  Medications  . Galcanezumab-gnlm (EMGALITY) 120 MG/ML SOAJ    Sig: Inject 240 mg into the skin once for 1 dose.    Dispense:  2 pen    Refill:  0    Order Specific Question:   Lot Number?    Answer:   [id number] [facility]    Order Specific Question:   Quantity    Answer:   2    Comments:   1 box given (2 pens)    Cc: Jeanella Anton, NP,  Harrison Mons, PA   A total of 60 minutes was spent face-to-face with this patient. Over half this time was spent on counseling patient on the  1. Chronic migraine without aura, with intractable migraine, so stated, with status migrainosus   2. Intractable migraine with aura with status migrainosus    diagnosis and different diagnostic and therapeutic options, counseling and coordination of care, risks ans benefits of management, compliance, or risk factor reduction and education.     Sarina Ill, MD  [facility] [address] [place], [place] [address]  Phone [phone] Fax [phone]

## 2018-10-01 ENCOUNTER — Other Ambulatory Visit: Payer: Self-pay

## 2018-10-01 ENCOUNTER — Emergency Department (HOSPITAL_COMMUNITY)

## 2018-10-01 ENCOUNTER — Inpatient Hospital Stay (HOSPITAL_COMMUNITY)
Admission: EM | Admit: 2018-10-01 | Discharge: 2018-10-03 | DRG: 247 | Disposition: A | Discharge status: Home / Self Care | Attending: Family Medicine | Admitting: Family Medicine

## 2018-10-01 ENCOUNTER — Encounter (HOSPITAL_COMMUNITY): Payer: Self-pay | Admitting: Emergency Medicine

## 2018-10-01 DIAGNOSIS — Z87891 Personal history of nicotine dependence: Secondary | ICD-10-CM

## 2018-10-01 DIAGNOSIS — Z885 Allergy status to narcotic agent status: Secondary | ICD-10-CM | POA: Diagnosis not present

## 2018-10-01 DIAGNOSIS — I2510 Atherosclerotic heart disease of native coronary artery without angina pectoris: Secondary | ICD-10-CM | POA: Diagnosis not present

## 2018-10-01 DIAGNOSIS — Z7982 Long term (current) use of aspirin: Secondary | ICD-10-CM | POA: Diagnosis not present

## 2018-10-01 DIAGNOSIS — Z91013 Allergy to seafood: Secondary | ICD-10-CM

## 2018-10-01 DIAGNOSIS — Z9119 Patient's noncompliance with other medical treatment and regimen: Secondary | ICD-10-CM | POA: Diagnosis not present

## 2018-10-01 DIAGNOSIS — E785 Hyperlipidemia, unspecified: Secondary | ICD-10-CM | POA: Diagnosis not present

## 2018-10-01 DIAGNOSIS — I249 Acute ischemic heart disease, unspecified: Secondary | ICD-10-CM

## 2018-10-01 DIAGNOSIS — Z79899 Other long term (current) drug therapy: Secondary | ICD-10-CM

## 2018-10-01 DIAGNOSIS — I10 Essential (primary) hypertension: Secondary | ICD-10-CM | POA: Diagnosis not present

## 2018-10-01 DIAGNOSIS — Z8249 Family history of ischemic heart disease and other diseases of the circulatory system: Secondary | ICD-10-CM

## 2018-10-01 DIAGNOSIS — Z96641 Presence of right artificial hip joint: Secondary | ICD-10-CM | POA: Diagnosis not present

## 2018-10-01 DIAGNOSIS — F329 Major depressive disorder, single episode, unspecified: Secondary | ICD-10-CM | POA: Diagnosis not present

## 2018-10-01 DIAGNOSIS — Z955 Presence of coronary angioplasty implant and graft: Secondary | ICD-10-CM

## 2018-10-01 DIAGNOSIS — F419 Anxiety disorder, unspecified: Secondary | ICD-10-CM | POA: Diagnosis not present

## 2018-10-01 DIAGNOSIS — Z79891 Long term (current) use of opiate analgesic: Secondary | ICD-10-CM | POA: Diagnosis not present

## 2018-10-01 DIAGNOSIS — Z833 Family history of diabetes mellitus: Secondary | ICD-10-CM

## 2018-10-01 DIAGNOSIS — Z853 Personal history of malignant neoplasm of breast: Secondary | ICD-10-CM | POA: Diagnosis not present

## 2018-10-01 DIAGNOSIS — Z20828 Contact with and (suspected) exposure to other viral communicable diseases: Secondary | ICD-10-CM | POA: Diagnosis not present

## 2018-10-01 DIAGNOSIS — Z888 Allergy status to other drugs, medicaments and biological substances status: Secondary | ICD-10-CM | POA: Diagnosis not present

## 2018-10-01 DIAGNOSIS — I214 Non-ST elevation (NSTEMI) myocardial infarction: Principal | ICD-10-CM | POA: Diagnosis present

## 2018-10-01 DIAGNOSIS — Z83511 Family history of glaucoma: Secondary | ICD-10-CM

## 2018-10-01 DIAGNOSIS — Z23 Encounter for immunization: Secondary | ICD-10-CM | POA: Diagnosis not present

## 2018-10-01 DIAGNOSIS — G8929 Other chronic pain: Secondary | ICD-10-CM | POA: Diagnosis present

## 2018-10-01 DIAGNOSIS — G43909 Migraine, unspecified, not intractable, without status migrainosus: Secondary | ICD-10-CM | POA: Diagnosis present

## 2018-10-01 DIAGNOSIS — R7303 Prediabetes: Secondary | ICD-10-CM | POA: Diagnosis present

## 2018-10-01 DIAGNOSIS — R079 Chest pain, unspecified: Secondary | ICD-10-CM | POA: Diagnosis present

## 2018-10-01 DIAGNOSIS — Z791 Long term (current) use of non-steroidal anti-inflammatories (NSAID): Secondary | ICD-10-CM

## 2018-10-01 DIAGNOSIS — E876 Hypokalemia: Secondary | ICD-10-CM | POA: Diagnosis not present

## 2018-10-01 DIAGNOSIS — Z803 Family history of malignant neoplasm of breast: Secondary | ICD-10-CM

## 2018-10-01 DIAGNOSIS — F32A Depression, unspecified: Secondary | ICD-10-CM | POA: Diagnosis present

## 2018-10-01 DIAGNOSIS — Z8049 Family history of malignant neoplasm of other genital organs: Secondary | ICD-10-CM

## 2018-10-01 LAB — CBC
HCT: 36.6 % (ref 36.0–46.0)
Hemoglobin: 12.3 g/dL (ref 12.0–15.0)
MCH: 29.3 pg (ref 26.0–34.0)
MCHC: 33.6 g/dL (ref 30.0–36.0)
MCV: 87.1 fL (ref 80.0–100.0)
Platelets: 254 10*3/uL (ref 150–400)
RBC: 4.2 MIL/uL (ref 3.87–5.11)
RDW: 13.2 % (ref 11.5–15.5)
WBC: 6.6 10*3/uL (ref 4.0–10.5)
nRBC: 0 % (ref 0.0–0.2)

## 2018-10-01 LAB — BASIC METABOLIC PANEL
Anion gap: 11 (ref 5–15)
BUN: 15 mg/dL (ref 8–23)
CO2: 22 mmol/L (ref 22–32)
Calcium: 9.1 mg/dL (ref 8.9–10.3)
Chloride: 103 mmol/L (ref 98–111)
Creatinine, Ser: 0.75 mg/dL (ref 0.44–1.00)
GFR calc Af Amer: >60 mL/min (ref >60)
GFR calc non Af Amer: >60 mL/min (ref >60)
Glucose, Bld: 154 mg/dL — ABNORMAL HIGH (ref 70–99)
Potassium: 3.5 mmol/L (ref 3.5–5.1)
Sodium: 136 mmol/L (ref 135–145)

## 2018-10-01 LAB — TROPONIN I (HIGH SENSITIVITY): Troponin I (High Sensitivity): 47 ng/L — ABNORMAL HIGH (ref <18)

## 2018-10-01 MED ORDER — SODIUM CHLORIDE 0.9% FLUSH
3.0000 mL | Freq: Once | INTRAVENOUS | Status: AC
  Administered 2018-10-02: 22:00:00 3 mL via INTRAVENOUS

## 2018-10-01 NOTE — ED Triage Notes (Signed)
Pt reports she began having nausea, chest pain and high bp yesterday while at her neuro doctor, states she tried to see her PCP but they cannot see her until April 15, 2022. States pain died down last night but today it got worse again and she is having jaw pain and R arm pain today. resp e/u, nad.

## 2018-10-02 ENCOUNTER — Observation Stay (HOSPITAL_BASED_OUTPATIENT_CLINIC_OR_DEPARTMENT_OTHER)

## 2018-10-02 ENCOUNTER — Encounter (HOSPITAL_COMMUNITY): Payer: Self-pay | Admitting: Family Medicine

## 2018-10-02 ENCOUNTER — Encounter (HOSPITAL_COMMUNITY)
Admission: EM | Disposition: A | Discharge status: Home / Self Care | Payer: Self-pay | Source: Home / Self Care | Attending: Family Medicine

## 2018-10-02 DIAGNOSIS — I214 Non-ST elevation (NSTEMI) myocardial infarction: Secondary | ICD-10-CM

## 2018-10-02 DIAGNOSIS — Z20828 Contact with and (suspected) exposure to other viral communicable diseases: Secondary | ICD-10-CM | POA: Diagnosis present

## 2018-10-02 DIAGNOSIS — Z888 Allergy status to other drugs, medicaments and biological substances status: Secondary | ICD-10-CM | POA: Diagnosis not present

## 2018-10-02 DIAGNOSIS — Z87891 Personal history of nicotine dependence: Secondary | ICD-10-CM | POA: Diagnosis not present

## 2018-10-02 DIAGNOSIS — G8929 Other chronic pain: Secondary | ICD-10-CM

## 2018-10-02 DIAGNOSIS — Z833 Family history of diabetes mellitus: Secondary | ICD-10-CM | POA: Diagnosis not present

## 2018-10-02 DIAGNOSIS — I10 Essential (primary) hypertension: Secondary | ICD-10-CM | POA: Diagnosis present

## 2018-10-02 DIAGNOSIS — R7303 Prediabetes: Secondary | ICD-10-CM | POA: Diagnosis present

## 2018-10-02 DIAGNOSIS — Z23 Encounter for immunization: Secondary | ICD-10-CM | POA: Diagnosis not present

## 2018-10-02 DIAGNOSIS — Z9119 Patient's noncompliance with other medical treatment and regimen: Secondary | ICD-10-CM | POA: Diagnosis not present

## 2018-10-02 DIAGNOSIS — Z83511 Family history of glaucoma: Secondary | ICD-10-CM | POA: Diagnosis not present

## 2018-10-02 DIAGNOSIS — G43809 Other migraine, not intractable, without status migrainosus: Secondary | ICD-10-CM

## 2018-10-02 DIAGNOSIS — Z7982 Long term (current) use of aspirin: Secondary | ICD-10-CM | POA: Diagnosis not present

## 2018-10-02 DIAGNOSIS — I2510 Atherosclerotic heart disease of native coronary artery without angina pectoris: Secondary | ICD-10-CM

## 2018-10-02 DIAGNOSIS — E876 Hypokalemia: Secondary | ICD-10-CM | POA: Diagnosis not present

## 2018-10-02 DIAGNOSIS — Z885 Allergy status to narcotic agent status: Secondary | ICD-10-CM | POA: Diagnosis not present

## 2018-10-02 DIAGNOSIS — Z79899 Other long term (current) drug therapy: Secondary | ICD-10-CM | POA: Diagnosis not present

## 2018-10-02 DIAGNOSIS — Z791 Long term (current) use of non-steroidal anti-inflammatories (NSAID): Secondary | ICD-10-CM | POA: Diagnosis not present

## 2018-10-02 DIAGNOSIS — F419 Anxiety disorder, unspecified: Secondary | ICD-10-CM | POA: Diagnosis present

## 2018-10-02 DIAGNOSIS — M546 Pain in thoracic spine: Secondary | ICD-10-CM

## 2018-10-02 DIAGNOSIS — E785 Hyperlipidemia, unspecified: Secondary | ICD-10-CM | POA: Diagnosis present

## 2018-10-02 DIAGNOSIS — Z853 Personal history of malignant neoplasm of breast: Secondary | ICD-10-CM | POA: Diagnosis not present

## 2018-10-02 DIAGNOSIS — Z96641 Presence of right artificial hip joint: Secondary | ICD-10-CM | POA: Diagnosis present

## 2018-10-02 DIAGNOSIS — R079 Chest pain, unspecified: Secondary | ICD-10-CM | POA: Diagnosis present

## 2018-10-02 DIAGNOSIS — Z79891 Long term (current) use of opiate analgesic: Secondary | ICD-10-CM | POA: Diagnosis not present

## 2018-10-02 DIAGNOSIS — G43909 Migraine, unspecified, not intractable, without status migrainosus: Secondary | ICD-10-CM | POA: Diagnosis present

## 2018-10-02 DIAGNOSIS — Z91013 Allergy to seafood: Secondary | ICD-10-CM | POA: Diagnosis not present

## 2018-10-02 DIAGNOSIS — F329 Major depressive disorder, single episode, unspecified: Secondary | ICD-10-CM | POA: Diagnosis present

## 2018-10-02 HISTORY — PX: CORONARY STENT INTERVENTION: CATH118234

## 2018-10-02 HISTORY — PX: LEFT HEART CATH AND CORONARY ANGIOGRAPHY: CATH118249

## 2018-10-02 LAB — ECHOCARDIOGRAM COMPLETE
Height: 68 in
Weight: 3444.8 oz

## 2018-10-02 LAB — LIPID PANEL
Cholesterol: 215 mg/dL — ABNORMAL HIGH (ref 0–200)
HDL: 72 mg/dL (ref >40)
LDL Cholesterol: 136 mg/dL — ABNORMAL HIGH (ref 0–99)
Total CHOL/HDL Ratio: 3 RATIO
Triglycerides: 35 mg/dL (ref <150)
VLDL: 7 mg/dL (ref 0–40)

## 2018-10-02 LAB — POCT ACTIVATED CLOTTING TIME
Activated Clotting Time: 241 seconds
Activated Clotting Time: 268 seconds
Activated Clotting Time: 279 seconds

## 2018-10-02 LAB — TROPONIN I (HIGH SENSITIVITY)
Troponin I (High Sensitivity): 62 ng/L — ABNORMAL HIGH (ref <18)
Troponin I (High Sensitivity): 85 ng/L — ABNORMAL HIGH (ref <18)
Troponin I (High Sensitivity): 83 ng/L — ABNORMAL HIGH (ref <18)

## 2018-10-02 LAB — BASIC METABOLIC PANEL
Anion gap: 10 (ref 5–15)
BUN: 12 mg/dL (ref 8–23)
CO2: 25 mmol/L (ref 22–32)
Calcium: 8.9 mg/dL (ref 8.9–10.3)
Chloride: 103 mmol/L (ref 98–111)
Creatinine, Ser: 0.65 mg/dL (ref 0.44–1.00)
GFR calc Af Amer: >60 mL/min (ref >60)
GFR calc non Af Amer: >60 mL/min (ref >60)
Glucose, Bld: 119 mg/dL — ABNORMAL HIGH (ref 70–99)
Potassium: 3.5 mmol/L (ref 3.5–5.1)
Sodium: 138 mmol/L (ref 135–145)

## 2018-10-02 LAB — HEMOGLOBIN A1C
Hgb A1c MFr Bld: 5.7 % — ABNORMAL HIGH (ref 4.8–5.6)
Mean Plasma Glucose: 116.89 mg/dL

## 2018-10-02 LAB — HIV ANTIBODY (ROUTINE TESTING W REFLEX): HIV Screen 4th Generation wRfx: NONREACTIVE

## 2018-10-02 LAB — SARS CORONAVIRUS 2 BY RT PCR (HOSPITAL ORDER, PERFORMED IN ~~LOC~~ HOSPITAL LAB): SARS Coronavirus 2: NEGATIVE

## 2018-10-02 SURGERY — LEFT HEART CATH AND CORONARY ANGIOGRAPHY
Anesthesia: LOCAL

## 2018-10-02 MED ORDER — SODIUM CHLORIDE 0.9 % WEIGHT BASED INFUSION: 1.0000 mL/kg/h | INTRAVENOUS | Status: DC

## 2018-10-02 MED ORDER — LIDOCAINE HCL (PF) 1 % IJ SOLN
INTRAMUSCULAR | Status: AC
  Filled 2018-10-02: qty 30

## 2018-10-02 MED ORDER — MIDAZOLAM HCL 2 MG/2ML IJ SOLN
INTRAMUSCULAR | Status: DC | PRN
  Administered 2018-10-02 (×2): 1 mg via INTRAVENOUS

## 2018-10-02 MED ORDER — METAXALONE 800 MG PO TABS
800.0000 mg | ORAL_TABLET | Freq: Three times a day (TID) | ORAL | Status: DC | PRN
  Filled 2018-10-02: qty 1

## 2018-10-02 MED ORDER — SODIUM CHLORIDE 0.9 % WEIGHT BASED INFUSION
3.0000 mL/kg/h | INTRAVENOUS | Status: DC
  Administered 2018-10-02: 10:00:00 3 mL/kg/h via INTRAVENOUS

## 2018-10-02 MED ORDER — HEPARIN (PORCINE) IN NACL 1000-0.9 UT/500ML-% IV SOLN
INTRAVENOUS | Status: DC | PRN
  Administered 2018-10-02 (×2): 500 mL

## 2018-10-02 MED ORDER — SODIUM CHLORIDE 0.9 % WEIGHT BASED INFUSION
1.0000 mL/kg/h | INTRAVENOUS | Status: DC
  Administered 2018-10-02: 10:00:00 1 mL/kg/h via INTRAVENOUS

## 2018-10-02 MED ORDER — INFLUENZA VAC SPLIT QUAD 0.5 ML IM SUSY
0.5000 mL | PREFILLED_SYRINGE | INTRAMUSCULAR | Status: AC
  Administered 2018-10-03: 0.5 mL via INTRAMUSCULAR
  Filled 2018-10-02: qty 0.5

## 2018-10-02 MED ORDER — FENTANYL CITRATE (PF) 100 MCG/2ML IJ SOLN
INTRAMUSCULAR | Status: AC
  Filled 2018-10-02: qty 2

## 2018-10-02 MED ORDER — ENOXAPARIN SODIUM 40 MG/0.4ML ~~LOC~~ SOLN
40.0000 mg | SUBCUTANEOUS | Status: DC
  Administered 2018-10-03: 09:00:00 40 mg via SUBCUTANEOUS
  Filled 2018-10-02: qty 0.4

## 2018-10-02 MED ORDER — LABETALOL HCL 5 MG/ML IV SOLN: 10.0000 mg | INTRAVENOUS | Status: AC | PRN

## 2018-10-02 MED ORDER — OXYCODONE HCL 5 MG PO TABS
5.0000 mg | ORAL_TABLET | ORAL | Status: DC | PRN
  Administered 2018-10-02 – 2018-10-03 (×2): 5 mg via ORAL
  Filled 2018-10-02 (×2): qty 1

## 2018-10-02 MED ORDER — METOPROLOL TARTRATE 25 MG PO TABS
25.0000 mg | ORAL_TABLET | Freq: Two times a day (BID) | ORAL | Status: DC
  Administered 2018-10-02 – 2018-10-03 (×3): 25 mg via ORAL
  Filled 2018-10-02 (×3): qty 1

## 2018-10-02 MED ORDER — METHYLPREDNISOLONE SODIUM SUCC 125 MG IJ SOLR
125.0000 mg | Freq: Once | INTRAMUSCULAR | Status: AC
  Administered 2018-10-02: 09:00:00 125 mg via INTRAVENOUS
  Filled 2018-10-02: qty 2

## 2018-10-02 MED ORDER — DIPHENHYDRAMINE HCL 50 MG/ML IJ SOLN
25.0000 mg | INTRAMUSCULAR | Status: AC
  Administered 2018-10-02: 12:00:00 25 mg via INTRAVENOUS
  Filled 2018-10-02: qty 1

## 2018-10-02 MED ORDER — BUTALBITAL-APAP-CAFFEINE 50-325-40 MG PO CAPS: 1.0000 | ORAL_CAPSULE | Freq: Four times a day (QID) | ORAL | Status: DC | PRN

## 2018-10-02 MED ORDER — HEPARIN (PORCINE) 25000 UT/250ML-% IV SOLN
950.0000 [IU]/h | INTRAVENOUS | Status: DC
  Administered 2018-10-02: 950 [IU]/h via INTRAVENOUS
  Filled 2018-10-02: qty 250

## 2018-10-02 MED ORDER — OXYCODONE-ACETAMINOPHEN 10-325 MG PO TABS: 1.0000 | ORAL_TABLET | ORAL | Status: DC | PRN

## 2018-10-02 MED ORDER — FENTANYL CITRATE (PF) 100 MCG/2ML IJ SOLN
INTRAMUSCULAR | Status: DC | PRN
  Administered 2018-10-02: 50 ug via INTRAVENOUS
  Administered 2018-10-02: 25 ug via INTRAVENOUS

## 2018-10-02 MED ORDER — DOCUSATE SODIUM 100 MG PO CAPS
100.0000 mg | ORAL_CAPSULE | Freq: Two times a day (BID) | ORAL | Status: DC
  Administered 2018-10-02 – 2018-10-03 (×2): 100 mg via ORAL
  Filled 2018-10-02 (×2): qty 1

## 2018-10-02 MED ORDER — SODIUM CHLORIDE 0.9 % IV SOLN: 250.0000 mL | INTRAVENOUS | Status: DC | PRN

## 2018-10-02 MED ORDER — SODIUM CHLORIDE 0.9% FLUSH
3.0000 mL | Freq: Two times a day (BID) | INTRAVENOUS | Status: DC
  Administered 2018-10-03: 3 mL via INTRAVENOUS

## 2018-10-02 MED ORDER — ONDANSETRON HCL 4 MG/2ML IJ SOLN: 4.0000 mg | Freq: Four times a day (QID) | INTRAMUSCULAR | Status: DC | PRN

## 2018-10-02 MED ORDER — BUTALBITAL-APAP-CAFFEINE 50-325-40 MG PO TABS
1.0000 | ORAL_TABLET | Freq: Four times a day (QID) | ORAL | Status: DC | PRN
  Administered 2018-10-02: 1 via ORAL
  Filled 2018-10-02: qty 1

## 2018-10-02 MED ORDER — HEPARIN BOLUS VIA INFUSION
4000.0000 [IU] | Freq: Once | INTRAVENOUS | Status: AC
  Administered 2018-10-02: 05:00:00 4000 [IU] via INTRAVENOUS
  Filled 2018-10-02: qty 4000

## 2018-10-02 MED ORDER — TICAGRELOR 90 MG PO TABS
ORAL_TABLET | ORAL | Status: AC
  Filled 2018-10-02: qty 2

## 2018-10-02 MED ORDER — SODIUM CHLORIDE 0.9 % WEIGHT BASED INFUSION: 3.0000 mL/kg/h | INTRAVENOUS | Status: DC

## 2018-10-02 MED ORDER — HEPARIN SODIUM (PORCINE) 1000 UNIT/ML IJ SOLN
INTRAMUSCULAR | Status: AC
  Filled 2018-10-02: qty 1

## 2018-10-02 MED ORDER — SODIUM CHLORIDE 0.9 % IV SOLN
INTRAVENOUS | Status: AC
  Administered 2018-10-02: 16:00:00 via INTRAVENOUS

## 2018-10-02 MED ORDER — SODIUM CHLORIDE 0.9% FLUSH: 3.0000 mL | INTRAVENOUS | Status: DC | PRN

## 2018-10-02 MED ORDER — VERAPAMIL HCL 2.5 MG/ML IV SOLN
INTRAVENOUS | Status: AC
  Filled 2018-10-02: qty 2

## 2018-10-02 MED ORDER — TICAGRELOR 90 MG PO TABS
90.0000 mg | ORAL_TABLET | Freq: Two times a day (BID) | ORAL | Status: DC
  Administered 2018-10-02 – 2018-10-03 (×2): 90 mg via ORAL
  Filled 2018-10-02 (×2): qty 1

## 2018-10-02 MED ORDER — ASPIRIN 81 MG PO CHEW: 81.0000 mg | CHEWABLE_TABLET | ORAL | Status: AC

## 2018-10-02 MED ORDER — VALACYCLOVIR HCL 500 MG PO TABS
500.0000 mg | ORAL_TABLET | Freq: Every day | ORAL | Status: DC
  Administered 2018-10-03: 500 mg via ORAL
  Filled 2018-10-02: qty 1

## 2018-10-02 MED ORDER — NITROGLYCERIN 1 MG/10 ML FOR IR/CATH LAB
INTRA_ARTERIAL | Status: DC | PRN
  Administered 2018-10-02 (×3): 200 ug via INTRACORONARY

## 2018-10-02 MED ORDER — OXYCODONE-ACETAMINOPHEN 5-325 MG PO TABS
1.0000 | ORAL_TABLET | ORAL | Status: DC | PRN
  Administered 2018-10-02 – 2018-10-03 (×2): 1 via ORAL
  Filled 2018-10-02 (×2): qty 1

## 2018-10-02 MED ORDER — TICAGRELOR 90 MG PO TABS
ORAL_TABLET | ORAL | Status: DC | PRN
  Administered 2018-10-02: 180 mg via ORAL

## 2018-10-02 MED ORDER — NITROGLYCERIN IN D5W 200-5 MCG/ML-% IV SOLN
0.0000 ug/min | INTRAVENOUS | Status: DC
  Administered 2018-10-02: 05:00:00 5 ug/min via INTRAVENOUS
  Filled 2018-10-02: qty 250

## 2018-10-02 MED ORDER — AMLODIPINE BESYLATE 10 MG PO TABS
10.0000 mg | ORAL_TABLET | Freq: Every day | ORAL | Status: DC
  Administered 2018-10-03: 10 mg via ORAL
  Filled 2018-10-02: qty 1

## 2018-10-02 MED ORDER — ACETAMINOPHEN 325 MG PO TABS: 650.0000 mg | ORAL_TABLET | ORAL | Status: DC | PRN

## 2018-10-02 MED ORDER — MORPHINE SULFATE (PF) 2 MG/ML IV SOLN
2.0000 mg | Freq: Once | INTRAVENOUS | Status: AC
  Administered 2018-10-02: 2 mg via INTRAVENOUS
  Filled 2018-10-02: qty 1

## 2018-10-02 MED ORDER — ASPIRIN EC 81 MG PO TBEC
81.0000 mg | DELAYED_RELEASE_TABLET | Freq: Every day | ORAL | Status: DC
  Administered 2018-10-03: 09:00:00 81 mg via ORAL
  Filled 2018-10-02: qty 1

## 2018-10-02 MED ORDER — HYDRALAZINE HCL 20 MG/ML IJ SOLN: 10.0000 mg | INTRAMUSCULAR | Status: AC | PRN

## 2018-10-02 MED ORDER — VERAPAMIL HCL 2.5 MG/ML IV SOLN
INTRAVENOUS | Status: DC | PRN
  Administered 2018-10-02: 10 mL via INTRA_ARTERIAL

## 2018-10-02 MED ORDER — MORPHINE SULFATE (PF) 2 MG/ML IV SOLN: 2.0000 mg | INTRAVENOUS | Status: DC | PRN

## 2018-10-02 MED ORDER — NITROGLYCERIN 1 MG/10 ML FOR IR/CATH LAB
INTRA_ARTERIAL | Status: AC
  Filled 2018-10-02: qty 10

## 2018-10-02 MED ORDER — HEPARIN (PORCINE) IN NACL 1000-0.9 UT/500ML-% IV SOLN
INTRAVENOUS | Status: AC
  Filled 2018-10-02: qty 1000

## 2018-10-02 MED ORDER — ASPIRIN 81 MG PO CHEW: 81.0000 mg | CHEWABLE_TABLET | ORAL | Status: DC

## 2018-10-02 MED ORDER — LIDOCAINE HCL (PF) 1 % IJ SOLN
INTRAMUSCULAR | Status: DC | PRN
  Administered 2018-10-02: 2 mL

## 2018-10-02 MED ORDER — BUPROPION HCL ER (XL) 150 MG PO TB24
150.0000 mg | ORAL_TABLET | Freq: Every day | ORAL | Status: DC
  Administered 2018-10-03: 150 mg via ORAL
  Filled 2018-10-02: qty 1

## 2018-10-02 MED ORDER — METOPROLOL TARTRATE 25 MG PO TABS: 12.5000 mg | ORAL_TABLET | Freq: Two times a day (BID) | ORAL | Status: DC

## 2018-10-02 MED ORDER — IOHEXOL 350 MG/ML SOLN
INTRAVENOUS | Status: DC | PRN
  Administered 2018-10-02: 14:00:00 130 mL

## 2018-10-02 MED ORDER — MIDAZOLAM HCL 2 MG/2ML IJ SOLN
INTRAMUSCULAR | Status: AC
  Filled 2018-10-02: qty 2

## 2018-10-02 MED ORDER — HEPARIN SODIUM (PORCINE) 1000 UNIT/ML IJ SOLN
INTRAMUSCULAR | Status: DC | PRN
  Administered 2018-10-02: 5000 [IU] via INTRAVENOUS

## 2018-10-02 MED ORDER — ASPIRIN 81 MG PO CHEW
324.0000 mg | CHEWABLE_TABLET | Freq: Once | ORAL | Status: AC
  Administered 2018-10-02: 05:00:00 324 mg via ORAL
  Filled 2018-10-02: qty 4

## 2018-10-02 SURGICAL SUPPLY — 19 items
BALLN SAPPHIRE 2.5X12 (BALLOONS) ×2
BALLN ~~LOC~~ EMERGE MR 3.5X12 (BALLOONS) ×2
BALLOON SAPPHIRE 2.5X12 (BALLOONS) IMPLANT
BALLOON ~~LOC~~ EMERGE MR 3.5X12 (BALLOONS) IMPLANT
CATH 5FR JL3.5 JR4 ANG PIG MP (CATHETERS) ×1 IMPLANT
CATH VISTA GUIDE 6FR XBLAD3.5 (CATHETERS) ×1 IMPLANT
DEVICE RAD COMP TR BAND LRG (VASCULAR PRODUCTS) ×1 IMPLANT
GLIDESHEATH SLEND SS 6F .021 (SHEATH) ×1 IMPLANT
GUIDEWIRE INQWIRE 1.5J.035X260 (WIRE) IMPLANT
INQWIRE 1.5J .035X260CM (WIRE) ×2
KIT ENCORE 26 ADVANTAGE (KITS) ×1 IMPLANT
KIT HEART LEFT (KITS) ×2 IMPLANT
PACK CARDIAC CATHETERIZATION (CUSTOM PROCEDURE TRAY) ×2 IMPLANT
STENT RESOLUTE ONYX 3.0X18 (Permanent Stent) ×1 IMPLANT
STENT RESOLUTE ONYX 3.5X12 (Permanent Stent) ×1 IMPLANT
SYR MEDRAD MARK 7 150ML (SYRINGE) ×2 IMPLANT
TRANSDUCER W/STOPCOCK (MISCELLANEOUS) ×2 IMPLANT
TUBING CIL FLEX 10 FLL-RA (TUBING) ×2 IMPLANT
WIRE RUNTHROUGH .014X180CM (WIRE) ×1 IMPLANT

## 2018-10-02 NOTE — H&P (Signed)
History and Physical    Sherri [name] [id number] DOB: 07-31-1955 DOA: 10/01/2018  PCP: Harrison Mons, PA   Patient coming from: Home   Chief Complaint: Chest pain  HPI: Sherri [name] is a 63 y.o. female with medical history significant for migraine headaches, hypertension, hyperlipidemia with reported statin intolerance, depression, and chronic back pain, presenting to the emergency department for evaluation of chest pain.  Patient was at an outpatient neurology appointment on 09/30/2018 when she developed some mild nausea and chest discomfort.  She was noted to have elevated blood pressure at that time as well.  Symptoms seem to ease off before coming back yesterday evening, no with radiation to the right arm and jaw.  She seems to feel better with rest, but the chest discomfort has been fairly constant since yesterday evening.  She may have had some mild dyspnea associated with this, but no significant cough and she also denies fevers.  She denies any lower extremity swelling or tenderness.  She has never had these symptoms previously.  ED Course: Upon arrival to the ED, patient is found to be afebrile, saturating well on room air, slightly tachycardic, and with blood pressure 185/95.  EKG features sinus tachycardia with rate 105 and nonspecific T wave abnormalities.  Chest x-ray is negative for acute cardiopulmonary disease.  Chemistry panel and CBC are unremarkable.  High-sensitivity troponin is elevated to 47, then increase to 62 2-1/2 hours later.  Patient was given 324 mg of aspirin, morphine, started on heparin infusion, and started on nitroglycerin infusion in the ED.  ED physician discussed the case with cardiology who recommended a medical admission and for patient to be n.p.o. for possible catheterization.  Review of Systems:  All other systems reviewed and apart from HPI, are negative.  Past Medical History:  Diagnosis Date   Anxiety    Arthritis    Breast pain, right     Cancer ([place])    ? breast, cervical    Closed left ankle fracture 12/17/2015   Depression    situational   Hypertension    not taken atenolol or lisinopril in >66mos, no money   Migraines    Pneumonia    Ruptured disk     Past Surgical History:  Procedure Laterality Date   AUGMENTATION MAMMAPLASTY Right    1979   BREAST SURGERY     lumpectomy in 20s for benign mass with implant placement (subglandular)   CARPAL TUNNEL RELEASE     right wrist   CARPAL TUNNEL RELEASE Right 09/02/2014   Procedure: RIGHT CARPAL TUNNEL RELEASE;  Surgeon: Melrose Nakayama, MD;  Location: [facility];  Service: Orthopedics;  Laterality: Right;   CERVICAL CONE BIOPSY     COLONOSCOPY     KNEE ARTHROSCOPY Left 07/1996   KNEE ARTHROSCOPY Right 1998   ORIF ANKLE FRACTURE Left 12/17/2015   Procedure: OPEN REDUCTION INTERNAL FIXATION (ORIF) ANKLE FRACTURE;  Surgeon: Milly Jakob, MD;  Location: [facility];  Service: Orthopedics;  Laterality: Left;   SPINE SURGERY  72011   L2-L5 stabilization   TOTAL HIP ARTHROPLASTY Right 10/15/2016   TOTAL HIP ARTHROPLASTY Right 10/15/2016   Procedure: TOTAL HIP ARTHROPLASTY ANTERIOR APPROACH;  Surgeon: Melrose Nakayama, MD;  Location: [facility];  Service: Orthopedics;  Laterality: Right;     reports that she quit smoking about 42 years ago. Her smoking use included cigarettes. She quit after 1.00 year of use. She has never used smokeless tobacco. She reports that she does not drink  alcohol or use drugs.  Allergies  Allergen Reactions   Effexor [Venlafaxine Hydrochloride] Shortness Of Breath, Rash and Other (See Comments)    HALLUCINATIONS   Iodine Anaphylaxis   Shellfish Allergy Anaphylaxis and Other (See Comments)   Codeine Swelling and Rash    SWELLING REACTION UNSPECIFIED    Flexeril [Cyclobenzaprine Hcl] Other (See Comments)    HALLUCINATIONS AND "OUT OF CONTROL"   Hydromorphone Other (See Comments)    HALLUCINATIONS   Nubain  [Nalbuphine Hcl] Other (See Comments)    HALLUCINATIONS   Atorvastatin Other (See Comments)    Severe joint pain   Erythromycin Rash    Family History  Problem Relation Age of Onset   Diabetes Mother    Glaucoma Mother    Cancer Mother        breast, ovarian   Heart disease Mother    Breast cancer Mother 54   Migraines Mother    Gout Father    Cancer Sister        ovarian, uterine cancer   Migraines Sister    Migraines Brother    Migraines Maternal Grandmother      Prior to Admission medications   Medication Sig Start Date End Date Taking? Authorizing Provider  amLODipine (NORVASC) 5 MG tablet Take 1 tablet (5 mg total) by mouth daily. Patient taking differently: Take 10 mg by mouth daily.  05/06/17   Harrison Mons, PA  aspirin EC 325 MG EC tablet Take 1 tablet (325 mg total) by mouth 2 (two) times daily after a meal. 10/18/16   Loni Dolly, PA-C  bisacodyl (DULCOLAX) 5 MG EC tablet Take 1 tablet (5 mg total) by mouth daily as needed for moderate constipation. 10/18/16   Loni Dolly, PA-C  buPROPion (WELLBUTRIN XL) 150 MG 24 hr tablet Take 1 tablet (150 mg total) by mouth daily. 05/06/17   Harrison Mons, PA  Butalbital-APAP-Caffeine 50-325-40 MG capsule Take by mouth. 05/12/17   [provider]  docusate sodium (COLACE) 100 MG capsule Take 1 capsule (100 mg total) by mouth 2 (two) times daily. 10/18/16   Loni Dolly, PA-C  DOXYLAMINE SUCCINATE PO Take 0.25 mg by mouth as needed (for sleep).    [provider]  lidocaine (LIDODERM) 5 % Place 1 patch onto the skin daily. Remove & Discard patch within 12 hours or as directed by MD    [provider]  Liniments (SALONPAS EX) Apply 3 patches topically daily.    [provider]  meloxicam (MOBIC) 15 MG tablet Take 15 mg by mouth daily.    [provider]  metaxalone (SKELAXIN) 800 MG tablet Take 1 tablet (800 mg total) by mouth 3 (three) times daily as needed for pain.  02/27/12   Harrison Mons, PA  OVER THE COUNTER MEDICATION Take 0.25 each by mouth See admin instructions. [employer] CBD-Rich Hemp Oil 750-CBD - Take 1/4 dropper by mouth twice daily    [provider]  oxyCODONE-acetaminophen (PERCOCET) 10-325 MG tablet Take 1-2 tablets by mouth every 4 (four) hours as needed for pain. 10/18/16   Loni Dolly, PA-C  promethazine (PHENERGAN) 25 MG tablet Take 1 tablet (25 mg total) by mouth every 6 (six) hours as needed for nausea. 10/18/16   Loni Dolly, PA-C  valACYclovir (VALTREX) 500 MG tablet Take 1 PO QD for suppression; INCREASE to BID x 3 days PRN outbreak 05/06/17   Harrison Mons, PA    Physical Exam: Vitals:   10/01/18 2052 10/02/18 0017 10/02/18 0235 10/02/18  0430  BP: (!) 182/97 (!) 185/95 (!) 174/73 (!) 161/74  Pulse: (!) 110 86 77 75  Resp: 18 16 16    Temp: 99 F (37.2 C) 98.3 F (36.8 C) 98.5 F (36.9 C)   TempSrc:  Oral Oral   SpO2: 99% 99% 99% 98%    Constitutional: NAD, calm, no pallor or diaphoresis  Eyes: PERTLA, lids and conjunctivae normal ENMT: Mucous membranes are moist. Posterior pharynx clear of any exudate or lesions.   Neck: normal, supple, no masses, no thyromegaly Respiratory: no wheezing, no crackles. Normal respiratory effort. No accessory muscle use.  Cardiovascular: S1 & S2 heard, regular rate and rhythm. No extremity edema.  Abdomen: No distension, no tenderness, soft. Bowel sounds active.  Musculoskeletal: no clubbing / cyanosis. No joint deformity upper and lower extremities.   Skin: no significant rashes, lesions, ulcers. Warm, dry, well-perfused. Neurologic: CN 2-12 grossly intact. Sensation intact. Moving all extremities.  Psychiatric: Alert and oriented x 3. Pleasant, cooperative.    Labs on Admission: I have personally reviewed following labs and imaging studies  CBC: Recent Labs  Lab 10/01/18 16-Apr-2055  WBC 6.6  HGB 12.3  HCT 36.6  MCV 87.1  PLT 0000000   Basic Metabolic  Panel: Recent Labs  Lab 10/01/18 2057  NA 136  K 3.5  CL 103  CO2 22  GLUCOSE 154*  BUN 15  CREATININE 0.75  CALCIUM 9.1   GFR: Estimated Creatinine Clearance: 88.6 mL/min (by C-G formula based on SCr of 0.75 mg/dL). Liver Function Tests: No results for input(s): AST, ALT, ALKPHOS, BILITOT, PROT, ALBUMIN in the last 168 hours. No results for input(s): LIPASE, AMYLASE in the last 168 hours. No results for input(s): AMMONIA in the last 168 hours. Coagulation Profile: No results for input(s): INR, PROTIME in the last 168 hours. Cardiac Enzymes: No results for input(s): CKTOTAL, CKMB, CKMBINDEX, TROPONINI in the last 168 hours. BNP (last 3 results) No results for input(s): PROBNP in the last 8760 hours. HbA1C: No results for input(s): HGBA1C in the last 72 hours. CBG: No results for input(s): GLUCAP in the last 168 hours. Lipid Profile: No results for input(s): CHOL, HDL, LDLCALC, TRIG, CHOLHDL, LDLDIRECT in the last 72 hours. Thyroid Function Tests: No results for input(s): TSH, T4TOTAL, FREET4, T3FREE, THYROIDAB in the last 72 hours. Anemia Panel: No results for input(s): VITAMINB12, FOLATE, FERRITIN, TIBC, IRON, RETICCTPCT in the last 72 hours. Urine analysis:    Component Value Date/Time   COLORURINE YELLOW 10/03/2016 0859   APPEARANCEUR Clear 05/06/2017 1341   LABSPEC 1.030 10/03/2016 0859   PHURINE 5.0 10/03/2016 0859   GLUCOSEU Negative 05/06/2017 1341   HGBUR NEGATIVE 10/03/2016 0859   BILIRUBINUR Negative 05/06/2017 1341   KETONESUR NEGATIVE 10/03/2016 0859   PROTEINUR Negative 05/06/2017 1341   PROTEINUR NEGATIVE 10/03/2016 0859   UROBILINOGEN 0.2 06/30/2009 1245   NITRITE Negative 05/06/2017 1341   NITRITE NEGATIVE 10/03/2016 0859   LEUKOCYTESUR Negative 05/06/2017 1341   Sepsis Labs: @LABRCNTIP (procalcitonin:4,lacticidven:4) )No results found for this or any previous visit (from the past 240 hour(s)).   Radiological Exams on Admission: Dg Chest 2  View  Result Date: 10/01/2018 CLINICAL DATA:  Pt reports she began having nausea, chest pain and high bp yesterday while at her neuro doctor, states she tried to see her PCP but they cannot see her until Apr 16, 2022. States pain died down last night but today it got worse again and she is having jaw pain and R arm pain today EXAM: CHEST - 2  VIEW COMPARISON:  12/20/2015 FINDINGS: Cardiac silhouette is normal in size. No mediastinal or hilar masses. No evidence of adenopathy. Clear lungs.  No pleural effusion or pneumothorax. Skeletal structures are intact. IMPRESSION: No active cardiopulmonary disease. Electronically Signed   By: Lajean Manes M.D.   On: 10/01/2018 21:20    EKG: Independently reviewed. Sinus tachycardia (rate 105), non-specific T-wave abnormalities.   Assessment/Plan   1. NSTEMI  - Presents with chest pain since 9/23, becoming more severe last night, and found to have non-specific T-wave abnormalities on EKG, unremarkable CXR, and HS troponin of 47, then 62 two and 1/2 hrs later  - She was treated with ASA 324 mg in ED and started on IV heparin and NTG infusions  - Cardiology was contacted by ED physician and recommended medical admission with pt to be NPO  - Start metoprolol for elevated BP, continue IV heparin and NTG infusions, continue cardiac monitoring, trend troponin, keep NPO    2. Hypertension  - BP elevated to 185/95 in ED, improved some with NTG but still elevated  - Start metoprolol, continue Norvasc     PPE: mask, face shield. Patient wearing mask.  DVT prophylaxis: IV heparin infusion  Code Status: Full  Family Communication: Discussed with patient Consults called: ED physician discussed case with cardiology  Admission status: Observation     Vianne Bulls, MD [facility] Pager [phone]  If 7PM-7AM, please contact night-coverage [email or web address] Password [facility]  10/02/2018, 4:46 AM

## 2018-10-02 NOTE — H&P (View-Only) (Signed)
Cardiology Consultation   Patient ID: RIVERS KERSCHER; YE:[id number]; 05-06-1955   Admit date: 10/01/2018 Date of Consult: 10/02/2018  Referring Provider:  Thayer Jew MD  Primary Care Provider: Harrison Mons, [place] Cardiologist: NA Electrophysiologist:  NA  Reason for Consultation: CP  History of Present Illness: Sherri [name] is a 63 y.o. female w/ h/o breast CA, HTN, migraines and chronic pain managed w/ chronic opioid therapy who is being seen today for the evaluation of CP at the request of [facility] ED. Pt has no prior h/o CAD, MI, HF or any other significant cardiac hx, although she does have +FHx early CAD.  Pt states pain began about 2 days ago. She first noted it while at her neurologist's office being seen for a migraine. She describes it as a "pressure" in the center and left side of her chest that has gradually gotten worse through the day Wednesday and yesterday, eventually radiating into the L side of her jaw and L arm. There is associated nausea but no SOB, diaphoresis, palpitations or other associated symptoms. When she came in to the ED tonight the pain was 8/10. Onset of pain at rest; it does not seem to be associated or made worse by exertion.  Pt has HTN and her BP has been running high the past few days. Prior notes from various other physicians state that the patient has been non-compliant in the past w/ her anti-HTN medications due to cost issues. It does not appear she has been on any recent anti-HTN therapy. She does take chronic opioids for chronic pain (percocet). Her EKG shows TWI inferiorly, slightly more prominent than prior tracings, and TWI in V3 that appears new compared to prior. Her HS troponin is abnormal. She has been started on heparin IV gtt in the ED. At the time of my exam, pt still c/o 8/10 CP, but she had just been started on NTG gtt (she had not received any SL NTG up to that point) and had just received an initial dose of morphine.   Past Medical  History:  Diagnosis Date  . Anxiety   . Arthritis   . Breast pain, right   . Cancer [facility])    ? breast, cervical   . Closed left ankle fracture 12/17/2015  . Depression    situational  . Hypertension    not taken atenolol or lisinopril in >38mos, no money  . Migraines   . Pneumonia   . Ruptured disk     Past Surgical History:  Procedure Laterality Date  . AUGMENTATION MAMMAPLASTY Right    1979  . BREAST SURGERY     lumpectomy in 20s for benign mass with implant placement (subglandular)  . CARPAL TUNNEL RELEASE     right wrist  . CARPAL TUNNEL RELEASE Right 09/02/2014   Procedure: RIGHT CARPAL TUNNEL RELEASE;  Surgeon: Melrose Nakayama, MD;  Location: [place];  Service: Orthopedics;  Laterality: Right;  . CERVICAL CONE BIOPSY    . COLONOSCOPY    . KNEE ARTHROSCOPY Left 07/1996  . KNEE ARTHROSCOPY Right 1998  . ORIF ANKLE FRACTURE Left 12/17/2015   Procedure: OPEN REDUCTION INTERNAL FIXATION (ORIF) ANKLE FRACTURE;  Surgeon: Milly Jakob, MD;  Location: [facility];  Service: Orthopedics;  Laterality: Left;  . SPINE SURGERY  72011   L2-L5 stabilization  . TOTAL HIP ARTHROPLASTY Right 10/15/2016  . TOTAL HIP ARTHROPLASTY Right 10/15/2016   Procedure: TOTAL HIP ARTHROPLASTY ANTERIOR APPROACH;  Surgeon: Melrose Nakayama, MD;  Location: [facility];  Service: Orthopedics;  Laterality: Right;      Current Medications: . aspirin  324 mg Oral Once  . heparin  4,000 Units Intravenous Once  .  morphine injection  2 mg Intravenous Once  . sodium chloride flush  3 mL Intravenous Once    Infused Medications: . heparin    . nitroGLYCERIN      PRN Medications:   No current facility-administered medications on file prior to encounter.    Current Outpatient Medications on File Prior to Encounter  Medication Sig Dispense Refill  . amLODipine (NORVASC) 5 MG tablet Take 1 tablet (5 mg total) by mouth daily. (Patient taking differently: Take 10 mg by mouth daily. ) 90 tablet 3  .  aspirin EC 325 MG EC tablet Take 1 tablet (325 mg total) by mouth 2 (two) times daily after a meal. 60 tablet 0  . bisacodyl (DULCOLAX) 5 MG EC tablet Take 1 tablet (5 mg total) by mouth daily as needed for moderate constipation. 10 tablet 0  . buPROPion (WELLBUTRIN XL) 150 MG 24 hr tablet Take 1 tablet (150 mg total) by mouth daily. 90 tablet 3  . Butalbital-APAP-Caffeine 50-325-40 MG capsule Take by mouth.    . docusate sodium (COLACE) 100 MG capsule Take 1 capsule (100 mg total) by mouth 2 (two) times daily. 30 capsule 0  . DOXYLAMINE SUCCINATE PO Take 0.25 mg by mouth as needed (for sleep).    Marland Kitchen lidocaine (LIDODERM) 5 % Place 1 patch onto the skin daily. Remove & Discard patch within 12 hours or as directed by MD    . Liniments (SALONPAS EX) Apply 3 patches topically daily.    . meloxicam (MOBIC) 15 MG tablet Take 15 mg by mouth daily.    . metaxalone (SKELAXIN) 800 MG tablet Take 1 tablet (800 mg total) by mouth 3 (three) times daily as needed for pain. 90 tablet 3  . OVER THE COUNTER MEDICATION Take 0.25 each by mouth See admin instructions. [employer] CBD-Rich Hemp Oil 750-CBD - Take 1/4 dropper by mouth twice daily    . oxyCODONE-acetaminophen (PERCOCET) 10-325 MG tablet Take 1-2 tablets by mouth every 4 (four) hours as needed for pain. 40 tablet 0  . promethazine (PHENERGAN) 25 MG tablet Take 1 tablet (25 mg total) by mouth every 6 (six) hours as needed for nausea. 30 tablet 1  . valACYclovir (VALTREX) 500 MG tablet Take 1 PO QD for suppression; INCREASE to BID x 3 days PRN outbreak 100 tablet 3     Allergies:    Allergies  Allergen Reactions  . Effexor [Venlafaxine Hydrochloride] Shortness Of Breath, Rash and Other (See Comments)    HALLUCINATIONS  . Iodine Anaphylaxis  . Shellfish Allergy Anaphylaxis and Other (See Comments)  . Codeine Swelling and Rash    SWELLING REACTION UNSPECIFIED   . Flexeril [Cyclobenzaprine Hcl] Other (See Comments)    HALLUCINATIONS AND  "OUT OF CONTROL"  . Hydromorphone Other (See Comments)    HALLUCINATIONS  . Nubain [Nalbuphine Hcl] Other (See Comments)    HALLUCINATIONS  . Atorvastatin Other (See Comments)    Severe joint pain  . Erythromycin Rash    Social History:   The patient  reports that she quit smoking about 42 years ago. Her smoking use included cigarettes. She quit after 1.00 year of use. She has never used smokeless tobacco. She reports that she does not drink alcohol or use drugs.    Family History:   The patient's family history includes  Breast cancer (age of onset: 58) in her mother; Cancer in her mother and sister; Diabetes in her mother; Glaucoma in her mother; Gout in her father; Heart disease in her mother; Migraines in her brother, maternal grandmother, mother, and sister.   ROS:  Please see the history of present illness.  All other ROS reviewed and negative.     Vital Signs: Blood pressure (!) 161/74, pulse 75, temperature 98.5 F (36.9 C), temperature source Oral, resp. rate 16, SpO2 98 %.   PHYSICAL EXAM: General:  Well nourished, well developed, in no acute distress HEENT: normal Lymph: no adenopathy Neck: no JVD Endocrine:  No thryomegaly Vascular: No carotid bruits; DP pulses 2+ bilaterally   Cardiac:  normal S1, S2; RRR; no murmur  Lungs:  clear to auscultation bilaterally, no wheezing, rhonchi or rales  Abd: soft, nontender, no hepatomegaly  Ext: no edema Musculoskeletal:  No deformities, BUE and BLE strength normal and equal Skin: warm and dry  Neuro:  CNs 2-12 intact, no focal abnormalities noted Psych:  Normal affect   EKG:  NSR with TWI inferior (more prominent as compared to 04-09-16 tracings); new TWI in V3. No STE  Labs: No results for input(s): CKTOTAL, CKMB, TROPONINI in the last 72 hours. No results for input(s): TROPIPOC in the last 72 hours.  Lab Results  Component Value Date   WBC 6.6 10/01/2018   HGB 12.3 10/01/2018   HCT 36.6 10/01/2018   MCV 87.1 10/01/2018    PLT 254 10/01/2018   Recent Labs  Lab 10/01/18 10-Apr-2055  NA 136  K 3.5  CL 103  CO2 22  BUN 15  CREATININE 0.75  CALCIUM 9.1  GLUCOSE 154*   Lab Results  Component Value Date   CHOL 195 05/06/2017   HDL 71 05/06/2017   LDLCALC 101 (H) 05/06/2017   TRIG 115 05/06/2017   Lab Results  Component Value Date   DDIMER 1.59 (H) 12/19/2015    Radiology/Studies:  Dg Chest 2 View  Result Date: 10/01/2018 CLINICAL DATA:  Pt reports she began having nausea, chest pain and high bp yesterday while at her neuro doctor, states she tried to see her PCP but they cannot see her until April 10, 2022. States pain died down last night but today it got worse again and she is having jaw pain and R arm pain today EXAM: CHEST - 2 VIEW COMPARISON:  12/20/2015 FINDINGS: Cardiac silhouette is normal in size. No mediastinal or hilar masses. No evidence of adenopathy. Clear lungs.  No pleural effusion or pneumothorax. Skeletal structures are intact. IMPRESSION: No active cardiopulmonary disease. Electronically Signed   By: Lajean Manes M.D.   On: 10/01/2018 21:20   12-19-15 TTE Left ventricle: The cavity size was normal. Systolic function was   vigorous. The estimated ejection fraction was in the range of 65%   to 70%. Wall motion was normal; there were no regional wall   motion abnormalities. Doppler parameters are consistent with   abnormal left ventricular relaxation (grade 1 diastolic   dysfunction). - Right ventricle: The cavity size was normal. Wall thickness was   normal. Systolic function was normal. -No hemodynamically significant valvular pathology noted  ASSESSMENT AND PLAN:  1. CP/NSTEMI: sx are typical for angina and there are subtle new EKG changes and abnormal troponin suggestive of NSTEMI. Would cont to cycle trops, cont heparin IV gtt. Will plan for [facility] for further evaluation. Get updated TTE as well.  2. HTN: BP uncontrolled; would start BB given ACS (could  start w/ metoprolol 25mg  bid and  uptitrate as needed); could also add low-dose ACEi if tolerated.  3. Risk assessment: would check FLP, A1c, TSH for screening. Pt lists atorvastatin as "allergy"; consider alternative statin if LDL significantly elevated and/or CAD is diagnosed  4. Chronic pain: on chronic opioid therapy. Mgmt as per primary medicine team  Thank you for the opportunity to participate in the care of this patient.  For questions or updates, please contact [place] Please consult [email or web address] for contact info under   Signed, Rudean Curt, MD, [facility]  10/02/2018 4:39 AM

## 2018-10-02 NOTE — Progress Notes (Addendum)
   Briefly seen and examined- please refer to consult by Dr. Alveta Heimlich this morning. 63 yo female with family history of premature CAD in mother's side, but few other cardiac risk factors - does have migraines. Has had 2 days of left/center chest pain, radiating to jaw and left arm - this was associated with headache and she was seeing her neurologist. BP is also noted to be elevated - apparently she had a diagnosis of hypertension, but has not been on medication. She does take chronic pain medication. Mild EKG changes noted - troponin minimally elevated but flat -could represent NSTEMI, but not typical for ACS. Suspect there could be CAD, but may also be coronary vasospasm given history of migraine. Plan for LHC today - she understands risks, benefits and alternatives and is agreeable to proceed.  Pixie Casino, MD, [facility], [place] Director of the [facility] Diplomate of the American Board of Clinical Lipidology Attending Cardiologist  Direct Dial: [phone]  Fax: [phone]  Website:  [email or web address]

## 2018-10-02 NOTE — Plan of Care (Signed)
  Problem: Education: Goal: Knowledge of General Education information will improve Description: Including pain rating scale, medication(s)/side effects and non-pharmacologic comfort measures Outcome: Progressing   Problem: Health Behavior/Discharge Planning: Goal: Ability to manage health-related needs will improve Outcome: Progressing   Problem: Clinical Measurements: Goal: Will remain free from infection Outcome: Progressing Goal: Cardiovascular complication will be avoided Outcome: Progressing   Problem: Nutrition: Goal: Adequate nutrition will be maintained Outcome: Progressing   Problem: Coping: Goal: Level of anxiety will decrease Outcome: Progressing   Problem: Pain Managment: Goal: General experience of comfort will improve Outcome: Progressing

## 2018-10-02 NOTE — Interval H&P Note (Signed)
History and Physical Interval Note:  10/02/2018 12:50 PM  Sherri [name]  has presented today for cardiac catheterization, with the diagnosis of NSTEMI.  The various methods of treatment have been discussed with the patient and family. After consideration of risks, benefits and other options for treatment, the patient has consented to  Procedure(s): LEFT HEART CATH AND CORONARY ANGIOGRAPHY (N/A) as a surgical intervention.  The patient's history has been reviewed, patient examined, no change in status, stable for surgery.  I have reviewed the patient's chart and labs.  Questions were answered to the patient's satisfaction.    Cath Lab Visit (complete for each Cath Lab visit)  Clinical Evaluation Leading to the Procedure:   ACS: Yes.    Non-ACS:  N/A  Jarrod Mcenery

## 2018-10-02 NOTE — ED Provider Notes (Signed)
[facility] EMERGENCY DEPARTMENT Provider Note   CSN: [id number] Arrival date & time: 10/01/18  2046     History   Chief Complaint Chief Complaint  Patient presents with  . Chest Pain    HPI Sherri [name] is a 63 y.o. female.     HPI  This is a 63 year old female with a history of breast cancer, hypertension, migraines who presents with chest pain.  Patient reports onset of chest pain on Wednesday.  She reports that she was at her neurologist office.  She reports pressure in her chest.  At that time it was nonradiating.  There was not exertional component.  She attempted to see her primary physician as she was notably hypertensive.  She was unable to get into her primary physician.  She states throughout the day on Thursday she continued to have anterior chest pain that then began to radiate into her left jaw and left arm.  She describes it as pressure.  She reports associated nausea.  No association with food.  Denies shortness of breath, cough, fevers.  No known sick contacts or COVID exposures.  Currently she rates her pain at 8 out of 10.  She took a baby aspirin prior to arrival.  Patient is a non-smoker.  Reports early family history of heart disease.  Past Medical History:  Diagnosis Date  . Anxiety   . Arthritis   . Breast pain, right   . Cancer [facility])    ? breast, cervical   . Closed left ankle fracture 12/17/2015  . Depression    situational  . Hypertension    not taken atenolol or lisinopril in >23mos, no money  . Migraines   . Pneumonia   . Ruptured disk     Patient Active Problem List   Diagnosis Date Noted  . Hyperlipidemia 06/10/2017  . Prediabetes 06/10/2017  . Ruptured right breast implant 05/27/2017  . Chronic bilateral thoracic back pain 05/27/2017  . Primary localized osteoarthritis of right hip 10/15/2016  . Spondylolysis of cervical region 09/03/2016  . Lumbar spondylosis 09/03/2016  . Displacement of cervical intervertebral  disc 09/03/2016  . Chest pain 12/19/2015  . Essential (primary) hypertension 09/23/2013  . Obesity, unspecified 09/23/2013  . Intractable chronic migraine without aura 02/15/2011  . Obesity 02/14/2011  . 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 Blindness of right eye 02/14/2011  . Depression 02/14/2011    Past Surgical History:  Procedure Laterality Date  . AUGMENTATION MAMMAPLASTY Right    1979  . BREAST SURGERY     lumpectomy in 20s for benign mass with implant placement (subglandular)  . CARPAL TUNNEL RELEASE     right wrist  . CARPAL TUNNEL RELEASE Right 09/02/2014   Procedure: RIGHT CARPAL TUNNEL RELEASE;  Surgeon: Melrose Nakayama, MD;  Location: [facility];  Service: Orthopedics;  Laterality: Right;  . CERVICAL CONE BIOPSY    . COLONOSCOPY    . KNEE ARTHROSCOPY Left 07/1996  . KNEE ARTHROSCOPY Right 1998  . ORIF ANKLE FRACTURE Left 12/17/2015   Procedure: OPEN REDUCTION INTERNAL FIXATION (ORIF) ANKLE FRACTURE;  Surgeon: Milly Jakob, MD;  Location: [facility];  Service: Orthopedics;  Laterality: Left;  . SPINE SURGERY  72011   L2-L5 stabilization  . TOTAL HIP ARTHROPLASTY Right 10/15/2016  . TOTAL HIP ARTHROPLASTY Right 10/15/2016   Procedure: TOTAL HIP ARTHROPLASTY ANTERIOR APPROACH;  Surgeon: Melrose Nakayama, MD;  Location: [facility];  Service: Orthopedics;  Laterality: Right;  OB History   No obstetric history on file.      Home Medications    Prior to Admission medications   Medication Sig Start Date End Date Taking? Authorizing Provider  amLODipine (NORVASC) 5 MG tablet Take 1 tablet (5 mg total) by mouth daily. Patient taking differently: Take 10 mg by mouth daily.  05/06/17   Harrison Mons, PA  aspirin EC 325 MG EC tablet Take 1 tablet (325 mg total) by mouth 2 (two) times daily after a meal. 10/18/16   Loni Dolly, PA-C  bisacodyl (DULCOLAX) 5 MG EC tablet Take 1 tablet (5 mg total) by mouth daily as needed for moderate  constipation. 10/18/16   Loni Dolly, PA-C  buPROPion (WELLBUTRIN XL) 150 MG 24 hr tablet Take 1 tablet (150 mg total) by mouth daily. 05/06/17   Harrison Mons, PA  Butalbital-APAP-Caffeine 50-325-40 MG capsule Take by mouth. 05/12/17   [provider]  docusate sodium (COLACE) 100 MG capsule Take 1 capsule (100 mg total) by mouth 2 (two) times daily. 10/18/16   Loni Dolly, PA-C  DOXYLAMINE SUCCINATE PO Take 0.25 mg by mouth as needed (for sleep).    [provider]  lidocaine (LIDODERM) 5 % Place 1 patch onto the skin daily. Remove & Discard patch within 12 hours or as directed by MD    [provider]  Liniments (SALONPAS EX) Apply 3 patches topically daily.    [provider]  meloxicam (MOBIC) 15 MG tablet Take 15 mg by mouth daily.    [provider]  metaxalone (SKELAXIN) 800 MG tablet Take 1 tablet (800 mg total) by mouth 3 (three) times daily as needed for pain. 02/27/12   Harrison Mons, PA  OVER THE COUNTER MEDICATION Take 0.25 each by mouth See admin instructions. [employer] CBD-Rich Hemp Oil 750-CBD - Take 1/4 dropper by mouth twice daily    [provider]  oxyCODONE-acetaminophen (PERCOCET) 10-325 MG tablet Take 1-2 tablets by mouth every 4 (four) hours as needed for pain. 10/18/16   Loni Dolly, PA-C  promethazine (PHENERGAN) 25 MG tablet Take 1 tablet (25 mg total) by mouth every 6 (six) hours as needed for nausea. 10/18/16   Loni Dolly, PA-C  valACYclovir (VALTREX) 500 MG tablet Take 1 PO QD for suppression; INCREASE to BID x 3 days PRN outbreak 05/06/17   Harrison Mons, PA    Family History Family History  Problem Relation Age of Onset  . Diabetes Mother   . Glaucoma Mother   . Cancer Mother        breast, ovarian  . Heart disease Mother   . Breast cancer Mother 62  . Migraines Mother   . Gout Father   . Cancer Sister        ovarian, uterine cancer  . Migraines Sister   . Migraines Brother   .  Migraines Maternal Grandmother     Social History Social History   Tobacco Use  . Smoking status: Former Smoker    Years: 1.00    Types: Cigarettes    Quit date: 01/08/1976    Years since quitting: 42.7  . Smokeless tobacco: Never Used  Substance Use Topics  . Alcohol use: No    Alcohol/week: 0.0 standard drinks  . Drug use: No     Allergies   Effexor [venlafaxine hydrochloride], Iodine, Shellfish allergy, Codeine, Flexeril [cyclobenzaprine hcl], Hydromorphone, Nubain [nalbuphine hcl], Atorvastatin, and Erythromycin   Review of Systems Review of Systems  Constitutional: Negative for fever.  Respiratory: Negative for cough and shortness of breath.   Cardiovascular: Positive for chest pain. Negative for leg swelling.  Gastrointestinal: Positive for nausea. Negative for abdominal pain and vomiting.  Genitourinary: Negative for dysuria.  Neurological: Negative for weakness and numbness.  All other systems reviewed and are negative.    Physical Exam Updated Vital Signs BP (!) 174/73 (BP Location: Left Arm)   Pulse 77   Temp 98.5 F (36.9 C) (Oral)   Resp 16   SpO2 99%   Physical Exam Vitals signs and nursing note reviewed.  Constitutional:      Appearance: She is well-developed. She is obese. She is not ill-appearing.  HENT:     Head: Normocephalic and atraumatic.  Eyes:     Pupils: Pupils are equal, round, and reactive to light.  Neck:     Musculoskeletal: Neck supple.  Cardiovascular:     Rate and Rhythm: Normal rate and regular rhythm.     Heart sounds: Normal heart sounds.  Pulmonary:     Effort: Pulmonary effort is normal. No respiratory distress.     Breath sounds: No wheezing.  Abdominal:     General: Bowel sounds are normal.     Palpations: Abdomen is soft.  Musculoskeletal:     Right lower leg: No edema.     Left lower leg: No edema.  Skin:    General: Skin is warm and dry.  Neurological:     Mental Status: She is alert and oriented to person,  place, and time.  Psychiatric:        Mood and Affect: Mood is anxious.      ED Treatments / Results  Labs (all labs ordered are listed, but only abnormal results are displayed) Labs Reviewed  BASIC METABOLIC PANEL - Abnormal; Notable for the following components:      Result Value   Glucose, Bld 154 (*)    All other components within normal limits  TROPONIN I (HIGH SENSITIVITY) - Abnormal; Notable for the following components:   Troponin I (High Sensitivity) 47 (*)    All other components within normal limits  TROPONIN I (HIGH SENSITIVITY) - Abnormal; Notable for the following components:   Troponin I (High Sensitivity) 62 (*)    All other components within normal limits  SARS CORONAVIRUS 2 (HOSPITAL ORDER, [facility] [facility])  CBC  HEPARIN LEVEL (UNFRACTIONATED)    EKG EKG Interpretation  Date/Time:  Thursday October 01 2018 20:55:48 EDT Ventricular Rate:  105 PR Interval:  172 QRS Duration: 82 QT Interval:  324 QTC Calculation: 428 R Axis:   22 Text Interpretation:  Sinus tachycardia Cannot rule out Anterior infarct , age undetermined T wave abnormality, consider inferior ischemia Abnormal ECG When compared with ECG of 10/16/2016, HEART RATE has increased Premature ventricular complexes are no longer present Confirmed by Delora Fuel ([id number]) on 10/01/2018 11:33:07 PM   Radiology Dg Chest 2 View  Result Date: 10/01/2018 CLINICAL DATA:  Pt reports she began having nausea, chest pain and high bp yesterday while at her neuro doctor, states she tried to see her PCP but they cannot see her until 04-16-2022. States pain died down last night but today it got worse again and she is having jaw pain and R arm pain today EXAM: CHEST - 2 VIEW COMPARISON:  12/20/2015 FINDINGS: Cardiac silhouette is normal in size. No mediastinal or hilar masses. No evidence of adenopathy. Clear lungs.  No pleural effusion or pneumothorax. Skeletal structures are intact. IMPRESSION: No  active cardiopulmonary disease. Electronically Signed   By: Lajean Manes M.D.   On: 10/01/2018 21:20    Procedures Procedures (including critical care time)  CRITICAL CARE Performed by: Merryl Hacker   Total critical care time: 35 minutes  Critical care time was exclusive of separately billable procedures and treating other patients.  Critical care was necessary to treat or prevent imminent or life-threatening deterioration.  Critical care was time spent personally by me on the following activities: development of treatment plan with patient and/or surrogate as well as nursing, discussions with consultants, evaluation of patient's response to treatment, examination of patient, obtaining history from patient or surrogate, ordering and performing treatments and interventions, ordering and review of laboratory studies, ordering and review of radiographic studies, pulse oximetry and re-evaluation of patient's condition.   Medications Ordered in ED Medications  sodium chloride flush (NS) 0.9 % injection 3 mL (has no administration in time range)  aspirin chewable tablet 324 mg (has no administration in time range)  nitroGLYCERIN 50 mg in dextrose 5 % 250 mL (0.2 mg/mL) infusion (has no administration in time range)  morphine 2 MG/ML injection 2 mg (has no administration in time range)  heparin bolus via infusion 4,000 Units (has no administration in time range)  heparin ADULT infusion 100 units/mL (25000 units/24mL sodium chloride 0.45%) (has no administration in time range)     Initial Impression / Assessment and Plan / ED Course  I have reviewed the triage vital signs and the nursing notes.  Pertinent labs & imaging results that were available during my care of the patient were reviewed by me and considered in my medical decision making (see chart for details).        Patient presents with chest pain.  Ongoing and worsening since Wednesday.  She reports an exertional  component.  She is overall nontoxic-appearing.  Vital signs notable for blood pressure of 174/73.  Patient does have risk factors for ACS including age, obesity, hypertension, early family history.  Her heart score is 6.  Initial EKG does not show any ST elevation but she has some nonspecific changes.  Troponin is 47.  Patient was given aspirin, nitroglycerin IV, heparin IV and morphine with concerns for primary ACS.  Low suspicion for blood clots.  Repeat troponin noted to be 62.  I discussed patient with the cardiology fellow, Dr. Hassell Done.  She recommends keeping the patient n.p.o. for possible catheterization.  Admit to medicine.  Heart score 6 for age, history (highly suspicious), risk factors (3), and initial troponin.  Final Clinical Impressions(s) / ED Diagnoses   Final diagnoses:  ACS (acute coronary syndrome) [facility])    ED Discharge Orders    None       Dina Rich, Barbette Hair, MD 10/02/18 [phone]

## 2018-10-02 NOTE — Progress Notes (Signed)
ANTICOAGULATION CONSULT NOTE - Initial Consult  Pharmacy Consult for Heparin  Indication: chest pain/ACS  Allergies  Allergen Reactions  . Effexor [Venlafaxine Hydrochloride] Shortness Of Breath, Rash and Other (See Comments)    HALLUCINATIONS  . Iodine Anaphylaxis  . Shellfish Allergy Anaphylaxis and Other (See Comments)  . Codeine Swelling and Rash    SWELLING REACTION UNSPECIFIED   . Flexeril [Cyclobenzaprine Hcl] Other (See Comments)    HALLUCINATIONS AND "OUT OF CONTROL"  . Hydromorphone Other (See Comments)    HALLUCINATIONS  . Nubain [Nalbuphine Hcl] Other (See Comments)    HALLUCINATIONS  . Atorvastatin Other (See Comments)    Severe joint pain  . Erythromycin Rash    Vital Signs: Temp: 98.5 F (36.9 C) (09/25 0235) Temp Source: Oral (09/25 0235) BP: 174/73 (09/25 0235) Pulse Rate: 77 (09/25 0235)  Labs: Recent Labs    10/01/18 2057 10/01/18 2325  HGB 12.3  --   HCT 36.6  --   PLT 254  --   CREATININE 0.75  --   TROPONINIHS 47* 62*    Estimated Creatinine Clearance: 88.6 mL/min (by C-G formula based on SCr of 0.75 mg/dL).   Medical History: Past Medical History:  Diagnosis Date  . Anxiety   . Arthritis   . Breast pain, right   . Cancer [facility])    ? breast, cervical   . Closed left ankle fracture 12/17/2015  . Depression    situational  . Hypertension    not taken atenolol or lisinopril in >66mos, no money  . Migraines   . Pneumonia   . Ruptured disk    Assessment: 63 y/o F to start heparin for chest pain. Troponin elevated. CBC/renal function OK. PTA meds reviewed.   Goal of Therapy:  Heparin level 0.3-0.7 units/ml Monitor platelets by anticoagulation protocol: Yes   Plan:  Heparin 4000 units BOLUS Start heparin drip at 950 units/hr 1300 HL Daily CBC/HL Monitor for bleeding   Narda Bonds 10/02/2018,4:18 AM

## 2018-10-02 NOTE — Progress Notes (Signed)
PROGRESS NOTE    Sherri [name]  [id number] DOB: [birth year] DOA: 10/01/2018 PCP: Harrison Mons, PA   Brief Narrative:  Sherri [name] is Sherri [name] 63 y.o. female with medical history significant for migraine headaches, hypertension, hyperlipidemia with reported statin intolerance, depression, and chronic back pain, presenting to the emergency department for evaluation of chest pain.  Patient was at an outpatient neurology appointment on 09/30/2018 when she developed some mild nausea and chest discomfort.  She was noted to have elevated blood pressure at that time as well.  Symptoms seem to ease off before coming back yesterday evening, no with radiation to the right arm and jaw.  She seems to feel better with rest, but the chest discomfort has been fairly constant since yesterday evening.  She may have had some mild dyspnea associated with this, but no significant cough and she also denies fevers.  She denies any lower extremity swelling or tenderness.  She has never had these symptoms previously.  ED Course: Upon arrival to the ED, patient is found to be afebrile, saturating well on room air, slightly tachycardic, and with blood pressure 185/95.  EKG features sinus tachycardia with rate 105 and nonspecific T wave abnormalities.  Chest x-ray is negative for acute cardiopulmonary disease.  Chemistry panel and CBC are unremarkable.  High-sensitivity troponin is elevated to 47, then increase to 62 2-1/2 hours later.  Patient was given 324 mg of aspirin, morphine, started on heparin infusion, and started on nitroglycerin infusion in the ED.  ED physician discussed the case with cardiology who recommended Sherri [name] medical admission and for patient to be n.p.o. for possible catheterization.  Assessment & Plan:   Principal Problem:   NSTEMI (non-ST elevated myocardial infarction) ([place]) Active Problems:   Depression   Essential (primary) hypertension   Chronic bilateral thoracic back pain   Migraines   1.  NSTEMI  - Presents with chest pain since 9/23, becoming more severe last night, and found to have non-specific T-wave abnormalities on EKG, unremarkable CXR - troponin from 47 ->62 -> 85 - She was treated with ASA 324 mg in ED and started on IV heparin and NTG infusions  - Cardiology was consulted and pt has now undergone catheterization with severe single vessel CAD with 99% mid LAD stenosis.  S/p successful PCI to the mid LAD.  Continue aspirin and ticagrelor x 12 months. - continue metoprolol.  Start atorvastatin.  - a1c is 5.7, ldl 136, HDL 72 - she's not tolerated atorvastatin before.  Will discuss further.  Consider zetia. - continue metoprolol  2. Hypertension  - BP elevated to 185/95 in ED, improved some with NTG but still elevated  - Start metoprolol, continue Norvasc    # Prediabetes: follow with PCP outpatient # HLD: will need to discuss further as noted above  DVT prophylaxis: lovenox Code Status: full Family Communication: none at bedside Disposition Plan: pending cards s/o.  Inpatient given NSTEMI with need for cath.   Consultants:   cardiology  Procedures:  Conclusions: 1. Severe single vessel coronary artery disease with 99% mid LAD stenosis. 2. Mild, non-obstructive coronary artery disease involving the LCx and RCA. 3. Apical hypokinesis with otherwise preserved left ventricular systolic function. 4. Successful PCI to the mid LAD using overlapping Resolute Onyx 3.5 x 12 mm (proximal) and 3.0 x 18 mm (distal) drug-eluting stents with 0% residual stenosis and TIMI-3 flow.  Second stent was placed proximally due to focal hazy stenosis of proximal edge of the more distal stent concerning  for plaque shift or small dissection.  D2 was jailed by the more proximal stent without significant change in mild ostial stenosis.  Recommendations: 1. Dual antiplatelet therapy with aspirin and ticagrelor for at least 12 months. 2. Aggressive secondary prevention.   Antimicrobials:  Anti-infectives (From admission, onward)   None     Subjective: C/o persistent, but slightly improved CP on my exam this AM  Objective: Vitals:   10/02/18 1349 10/02/18 1354 10/02/18 1359 10/02/18 1438  BP: (!) 144/84 133/79 128/82 130/67  Pulse: 68 69 81 77  Resp: 14 14 12    Temp:    98.4 F (36.9 C)  TempSrc:    Oral  SpO2: 98% 98% 98% 98%  Weight:    96.6 kg  Height:    5\' 8"  (1.727 m)   No intake or output data in the 24 hours ending 10/02/18 1654 Filed Weights   10/02/18 0558 10/02/18 1438  Weight: 97.7 kg 96.6 kg    Examination:  General exam: Appears calm and comfortable  Respiratory system: Clear to auscultation. Respiratory effort normal. Cardiovascular system: S1 & S2 heard, RRR. Gastrointestinal system: Abdomen is nondistended, soft and nontender. Central nervous system: Alert and oriented. No focal neurological deficits. Extremities: no LEE Skin: No rashes, lesions or ulcers Psychiatry: Judgement and insight appear normal. Mood & affect appropriate.     Data Reviewed: I have personally reviewed following labs and imaging studies  CBC: Recent Labs  Lab 10/01/18 2057  WBC 6.6  HGB 12.3  HCT 36.6  MCV 87.1  PLT 0000000   Basic Metabolic Panel: Recent Labs  Lab 10/01/18 2057 10/02/18 0520  NA 136 138  K 3.5 3.5  CL 103 103  CO2 22 25  GLUCOSE 154* 119*  BUN 15 12  CREATININE 0.75 0.65  CALCIUM 9.1 8.9   GFR: Estimated Creatinine Clearance: 88.6 mL/min (by C-G formula based on SCr of 0.65 mg/dL). Liver Function Tests: No results for input(s): AST, ALT, ALKPHOS, BILITOT, PROT, ALBUMIN in the last 168 hours. No results for input(s): LIPASE, AMYLASE in the last 168 hours. No results for input(s): AMMONIA in the last 168 hours. Coagulation Profile: No results for input(s): INR, PROTIME in the last 168 hours. Cardiac Enzymes: No results for input(s): CKTOTAL, CKMB, CKMBINDEX, TROPONINI in the last 168 hours. BNP (last 3  results) No results for input(s): PROBNP in the last 8760 hours. HbA1C: Recent Labs    10/02/18 0520  HGBA1C 5.7*   CBG: No results for input(s): GLUCAP in the last 168 hours. Lipid Profile: Recent Labs    10/02/18 0521  CHOL 215*  HDL 72  LDLCALC 136*  TRIG 35  CHOLHDL 3.0   Thyroid Function Tests: No results for input(s): TSH, T4TOTAL, FREET4, T3FREE, THYROIDAB in the last 72 hours. Anemia Panel: No results for input(s): VITAMINB12, FOLATE, FERRITIN, TIBC, IRON, RETICCTPCT in the last 72 hours. Sepsis Labs: No results for input(s): PROCALCITON, LATICACIDVEN in the last 168 hours.  Recent Results (from the past 240 hour(s))  SARS Coronavirus 2 [facility] order, Performed in [facility] hospital lab) Nasopharyngeal Nasopharyngeal Swab     Status: None   Collection Time: 10/02/18  5:07 AM   Specimen: Nasopharyngeal Swab  Result Value Ref Range Status   SARS Coronavirus 2 NEGATIVE NEGATIVE Final    Comment: (NOTE) If result is NEGATIVE SARS-CoV-2 target nucleic acids are NOT DETECTED. The SARS-CoV-2 RNA is generally detectable in upper and lower  respiratory specimens during the acute phase of infection. The  lowest  concentration of SARS-CoV-2 viral copies this assay can detect is 250  copies / mL. Daune Divirgilio negative result does not preclude SARS-CoV-2 infection  and should not be used as the sole basis for treatment or other  patient management decisions.  Marsalis Beaulieu negative result may occur with  improper specimen collection / handling, submission of specimen other  than nasopharyngeal swab, presence of viral mutation(s) within the  areas targeted by this assay, and inadequate number of viral copies  (<250 copies / mL). Keyshun Elpers negative result must be combined with clinical  observations, patient history, and epidemiological information. If result is POSITIVE SARS-CoV-2 target nucleic acids are DETECTED. The SARS-CoV-2 RNA is generally detectable in upper and lower  respiratory specimens  dur ing the acute phase of infection.  Positive  results are indicative of active infection with SARS-CoV-2.  Clinical  correlation with patient history and other diagnostic information is  necessary to determine patient infection status.  Positive results do  not rule out bacterial infection or co-infection with other viruses. If result is PRESUMPTIVE POSTIVE SARS-CoV-2 nucleic acids MAY BE PRESENT.   Toddy Boyd presumptive positive result was obtained on the submitted specimen  and confirmed on repeat testing.  While 2017-04-05 novel coronavirus  (SARS-CoV-2) nucleic acids may be present in the submitted sample  additional confirmatory testing may be necessary for epidemiological  and / or clinical management purposes  to differentiate between  SARS-CoV-2 and other Sarbecovirus currently known to infect humans.  If clinically indicated additional testing with an alternate test  methodology [phone]) is advised. The SARS-CoV-2 RNA is generally  detectable in upper and lower respiratory sp ecimens during the acute  phase of infection. The expected result is Negative. Fact Sheet for Patients:  StrictlyIdeas.no Fact Sheet for Healthcare Providers: BankingDealers.co.za This test is not yet approved or cleared by the [place] FDA and has been authorized for detection and/or diagnosis of SARS-CoV-2 by FDA under an Emergency Use Authorization (EUA).  This EUA will remain in effect (meaning this test can be used) for the duration of the COVID-19 declaration under Section 564(b)(1) of the Act, 21 U.S.C. section 360bbb-3(b)(1), unless the authorization is terminated or revoked sooner. Performed at [facility], [place] [address], [place], [place] [address]          Radiology Studies: Dg Chest 2 View  Result Date: 10/01/2018 CLINICAL DATA:  Pt reports she began having nausea, chest pain and high bp yesterday while at her neuro doctor, states she  tried to see her PCP but they cannot see her until April 06, 2022. States pain died down last night but today it got worse again and she is having jaw pain and R arm pain today EXAM: CHEST - 2 VIEW COMPARISON:  12/20/2015 FINDINGS: Cardiac silhouette is normal in size. No mediastinal or hilar masses. No evidence of adenopathy. Clear lungs.  No pleural effusion or pneumothorax. Skeletal structures are intact. IMPRESSION: No active cardiopulmonary disease. Electronically Signed   By: Lajean Manes M.D.   On: 10/01/2018 21:20        Scheduled Meds: . [START ON 10/03/2018] amLODipine  10 mg Oral Daily  . [START ON 10/03/2018] aspirin EC  81 mg Oral Daily  . [START ON 10/03/2018] enoxaparin (LOVENOX) injection  40 mg Subcutaneous Q24H  . [START ON 10/03/2018] influenza vac split quadrivalent PF  0.5 mL Intramuscular Tomorrow-1000  . metoprolol tartrate  25 mg Oral BID  . sodium chloride flush  3 mL Intravenous Once  .  sodium chloride flush  3 mL Intravenous Q12H  . ticagrelor  90 mg Oral BID   Continuous Infusions: . sodium chloride 75 mL/hr at 10/02/18 1534  . sodium chloride       LOS: 0 days    Time spent: over 30 min    Fayrene Helper, MD [facility] Pager AMION  If 7PM-7AM, please contact night-coverage [email or web address] Password [facility] 10/02/2018, 4:54 PM

## 2018-10-02 NOTE — Consult Note (Signed)
Cardiology Consultation   Patient ID: Sherri [name]; YE:[id number]; Nov 06, 1955   Admit date: 10/01/2018 Date of Consult: 10/02/2018  Referring Provider:  Thayer Jew MD  Primary Care Provider: Harrison Mons, [place] Cardiologist: NA Electrophysiologist:  NA  Reason for Consultation: CP  History of Present Illness: Sherri [name] is a 63 y.o. female w/ h/o breast CA, HTN, migraines and chronic pain managed w/ chronic opioid therapy who is being seen today for the evaluation of CP at the request of [facility] ED. Pt has no prior h/o CAD, MI, HF or any other significant cardiac hx, although she does have +FHx early CAD.  Pt states pain began about 2 days ago. She first noted it while at her neurologist's office being seen for a migraine. She describes it as a "pressure" in the center and left side of her chest that has gradually gotten worse through the day Wednesday and yesterday, eventually radiating into the L side of her jaw and L arm. There is associated nausea but no SOB, diaphoresis, palpitations or other associated symptoms. When she came in to the ED tonight the pain was 8/10. Onset of pain at rest; it does not seem to be associated or made worse by exertion.  Pt has HTN and her BP has been running high the past few days. Prior notes from various other physicians state that the patient has been non-compliant in the past w/ her anti-HTN medications due to cost issues. It does not appear she has been on any recent anti-HTN therapy. She does take chronic opioids for chronic pain (percocet). Her EKG shows TWI inferiorly, slightly more prominent than prior tracings, and TWI in V3 that appears new compared to prior. Her HS troponin is abnormal. She has been started on heparin IV gtt in the ED. At the time of my exam, pt still c/o 8/10 CP, but she had just been started on NTG gtt (she had not received any SL NTG up to that point) and had just received an initial dose of morphine.   Past Medical  History:  Diagnosis Date  . Anxiety   . Arthritis   . Breast pain, right   . Cancer [facility])    ? breast, cervical   . Closed left ankle fracture 12/17/2015  . Depression    situational  . Hypertension    not taken atenolol or lisinopril in >66mos, no money  . Migraines   . Pneumonia   . Ruptured disk     Past Surgical History:  Procedure Laterality Date  . AUGMENTATION MAMMAPLASTY Right    1979  . BREAST SURGERY     lumpectomy in 20s for benign mass with implant placement (subglandular)  . CARPAL TUNNEL RELEASE     right wrist  . CARPAL TUNNEL RELEASE Right 09/02/2014   Procedure: RIGHT CARPAL TUNNEL RELEASE;  Surgeon: Melrose Nakayama, MD;  Location: [place];  Service: Orthopedics;  Laterality: Right;  . CERVICAL CONE BIOPSY    . COLONOSCOPY    . KNEE ARTHROSCOPY Left 07/1996  . KNEE ARTHROSCOPY Right 1998  . ORIF ANKLE FRACTURE Left 12/17/2015   Procedure: OPEN REDUCTION INTERNAL FIXATION (ORIF) ANKLE FRACTURE;  Surgeon: Milly Jakob, MD;  Location: [facility];  Service: Orthopedics;  Laterality: Left;  . SPINE SURGERY  72011   L2-L5 stabilization  . TOTAL HIP ARTHROPLASTY Right 10/15/2016  . TOTAL HIP ARTHROPLASTY Right 10/15/2016   Procedure: TOTAL HIP ARTHROPLASTY ANTERIOR APPROACH;  Surgeon: Melrose Nakayama, MD;  Location: [place];  Service: Orthopedics;  Laterality: Right;      Current Medications: . aspirin  324 mg Oral Once  . heparin  4,000 Units Intravenous Once  .  morphine injection  2 mg Intravenous Once  . sodium chloride flush  3 mL Intravenous Once    Infused Medications: . heparin    . nitroGLYCERIN      PRN Medications:   No current facility-administered medications on file prior to encounter.    Current Outpatient Medications on File Prior to Encounter  Medication Sig Dispense Refill  . amLODipine (NORVASC) 5 MG tablet Take 1 tablet (5 mg total) by mouth daily. (Patient taking differently: Take 10 mg by mouth daily. ) 90 tablet 3  .  aspirin EC 325 MG EC tablet Take 1 tablet (325 mg total) by mouth 2 (two) times daily after a meal. 60 tablet 0  . bisacodyl (DULCOLAX) 5 MG EC tablet Take 1 tablet (5 mg total) by mouth daily as needed for moderate constipation. 10 tablet 0  . buPROPion (WELLBUTRIN XL) 150 MG 24 hr tablet Take 1 tablet (150 mg total) by mouth daily. 90 tablet 3  . Butalbital-APAP-Caffeine 50-325-40 MG capsule Take by mouth.    . docusate sodium (COLACE) 100 MG capsule Take 1 capsule (100 mg total) by mouth 2 (two) times daily. 30 capsule 0  . DOXYLAMINE SUCCINATE PO Take 0.25 mg by mouth as needed (for sleep).    Marland Kitchen lidocaine (LIDODERM) 5 % Place 1 patch onto the skin daily. Remove & Discard patch within 12 hours or as directed by MD    . Liniments (SALONPAS EX) Apply 3 patches topically daily.    . meloxicam (MOBIC) 15 MG tablet Take 15 mg by mouth daily.    . metaxalone (SKELAXIN) 800 MG tablet Take 1 tablet (800 mg total) by mouth 3 (three) times daily as needed for pain. 90 tablet 3  . OVER THE COUNTER MEDICATION Take 0.25 each by mouth See admin instructions. [employer] CBD-Rich Hemp Oil 750-CBD - Take 1/4 dropper by mouth twice daily    . oxyCODONE-acetaminophen (PERCOCET) 10-325 MG tablet Take 1-2 tablets by mouth every 4 (four) hours as needed for pain. 40 tablet 0  . promethazine (PHENERGAN) 25 MG tablet Take 1 tablet (25 mg total) by mouth every 6 (six) hours as needed for nausea. 30 tablet 1  . valACYclovir (VALTREX) 500 MG tablet Take 1 PO QD for suppression; INCREASE to BID x 3 days PRN outbreak 100 tablet 3     Allergies:    Allergies  Allergen Reactions  . Effexor [Venlafaxine Hydrochloride] Shortness Of Breath, Rash and Other (See Comments)    HALLUCINATIONS  . Iodine Anaphylaxis  . Shellfish Allergy Anaphylaxis and Other (See Comments)  . Codeine Swelling and Rash    SWELLING REACTION UNSPECIFIED   . Flexeril [Cyclobenzaprine Hcl] Other (See Comments)    HALLUCINATIONS AND  "OUT OF CONTROL"  . Hydromorphone Other (See Comments)    HALLUCINATIONS  . Nubain [Nalbuphine Hcl] Other (See Comments)    HALLUCINATIONS  . Atorvastatin Other (See Comments)    Severe joint pain  . Erythromycin Rash    Social History:   The patient  reports that she quit smoking about 42 years ago. Her smoking use included cigarettes. She quit after 1.00 year of use. She has never used smokeless tobacco. She reports that she does not drink alcohol or use drugs.    Family History:   The patient's family history includes  Breast cancer (age of onset: 71) in her mother; Cancer in her mother and sister; Diabetes in her mother; Glaucoma in her mother; Gout in her father; Heart disease in her mother; Migraines in her brother, maternal grandmother, mother, and sister.   ROS:  Please see the history of present illness.  All other ROS reviewed and negative.     Vital Signs: Blood pressure (!) 161/74, pulse 75, temperature 98.5 F (36.9 C), temperature source Oral, resp. rate 16, SpO2 98 %.   PHYSICAL EXAM: General:  Well nourished, well developed, in no acute distress HEENT: normal Lymph: no adenopathy Neck: no JVD Endocrine:  No thryomegaly Vascular: No carotid bruits; DP pulses 2+ bilaterally   Cardiac:  normal S1, S2; RRR; no murmur  Lungs:  clear to auscultation bilaterally, no wheezing, rhonchi or rales  Abd: soft, nontender, no hepatomegaly  Ext: no edema Musculoskeletal:  No deformities, BUE and BLE strength normal and equal Skin: warm and dry  Neuro:  CNs 2-12 intact, no focal abnormalities noted Psych:  Normal affect   EKG:  NSR with TWI inferior (more prominent as compared to 04-04-16 tracings); new TWI in V3. No STE  Labs: No results for input(s): CKTOTAL, CKMB, TROPONINI in the last 72 hours. No results for input(s): TROPIPOC in the last 72 hours.  Lab Results  Component Value Date   WBC 6.6 10/01/2018   HGB 12.3 10/01/2018   HCT 36.6 10/01/2018   MCV 87.1 10/01/2018    PLT 254 10/01/2018   Recent Labs  Lab 10/01/18 2055-04-05  NA 136  K 3.5  CL 103  CO2 22  BUN 15  CREATININE 0.75  CALCIUM 9.1  GLUCOSE 154*   Lab Results  Component Value Date   CHOL 195 05/06/2017   HDL 71 05/06/2017   LDLCALC 101 (H) 05/06/2017   TRIG 115 05/06/2017   Lab Results  Component Value Date   DDIMER 1.59 (H) 12/19/2015    Radiology/Studies:  Dg Chest 2 View  Result Date: 10/01/2018 CLINICAL DATA:  Pt reports she began having nausea, chest pain and high bp yesterday while at her neuro doctor, states she tried to see her PCP but they cannot see her until 04/05/2022. States pain died down last night but today it got worse again and she is having jaw pain and R arm pain today EXAM: CHEST - 2 VIEW COMPARISON:  12/20/2015 FINDINGS: Cardiac silhouette is normal in size. No mediastinal or hilar masses. No evidence of adenopathy. Clear lungs.  No pleural effusion or pneumothorax. Skeletal structures are intact. IMPRESSION: No active cardiopulmonary disease. Electronically Signed   By: Lajean Manes M.D.   On: 10/01/2018 21:20   12-19-15 TTE Left ventricle: The cavity size was normal. Systolic function was   vigorous. The estimated ejection fraction was in the range of 65%   to 70%. Wall motion was normal; there were no regional wall   motion abnormalities. Doppler parameters are consistent with   abnormal left ventricular relaxation (grade 1 diastolic   dysfunction). - Right ventricle: The cavity size was normal. Wall thickness was   normal. Systolic function was normal. -No hemodynamically significant valvular pathology noted  ASSESSMENT AND PLAN:  1. CP/NSTEMI: sx are typical for angina and there are subtle new EKG changes and abnormal troponin suggestive of NSTEMI. Would cont to cycle trops, cont heparin IV gtt. Will plan for [facility] for further evaluation. Get updated TTE as well.  2. HTN: BP uncontrolled; would start BB given ACS (could  start w/ metoprolol 25mg  bid and  uptitrate as needed); could also add low-dose ACEi if tolerated.  3. Risk assessment: would check FLP, A1c, TSH for screening. Pt lists atorvastatin as "allergy"; consider alternative statin if LDL significantly elevated and/or CAD is diagnosed  4. Chronic pain: on chronic opioid therapy. Mgmt as per primary medicine team  Thank you for the opportunity to participate in the care of this patient.  For questions or updates, please contact [place] Please consult [email or web address] for contact info under   Signed, Rudean Curt, MD, [facility]  10/02/2018 4:39 AM

## 2018-10-03 DIAGNOSIS — G43711 Chronic migraine without aura, intractable, with status migrainosus: Secondary | ICD-10-CM | POA: Insufficient documentation

## 2018-10-03 LAB — BASIC METABOLIC PANEL
Anion gap: 8 (ref 5–15)
BUN: 9 mg/dL (ref 8–23)
CO2: 23 mmol/L (ref 22–32)
Calcium: 8.8 mg/dL — ABNORMAL LOW (ref 8.9–10.3)
Chloride: 107 mmol/L (ref 98–111)
Creatinine, Ser: 0.68 mg/dL (ref 0.44–1.00)
GFR calc Af Amer: >60 mL/min (ref >60)
GFR calc non Af Amer: >60 mL/min (ref >60)
Glucose, Bld: 115 mg/dL — ABNORMAL HIGH (ref 70–99)
Potassium: 3.4 mmol/L — ABNORMAL LOW (ref 3.5–5.1)
Sodium: 138 mmol/L (ref 135–145)

## 2018-10-03 LAB — CBC
HCT: 33.9 % — ABNORMAL LOW (ref 36.0–46.0)
Hemoglobin: 11.2 g/dL — ABNORMAL LOW (ref 12.0–15.0)
MCH: 29.1 pg (ref 26.0–34.0)
MCHC: 33 g/dL (ref 30.0–36.0)
MCV: 88.1 fL (ref 80.0–100.0)
Platelets: 222 10*3/uL (ref 150–400)
RBC: 3.85 MIL/uL — ABNORMAL LOW (ref 3.87–5.11)
RDW: 13 % (ref 11.5–15.5)
WBC: 6.9 10*3/uL (ref 4.0–10.5)
nRBC: 0 % (ref 0.0–0.2)

## 2018-10-03 LAB — MAGNESIUM: Magnesium: 2.1 mg/dL (ref 1.7–2.4)

## 2018-10-03 MED ORDER — LISINOPRIL 10 MG PO TABS
10.0000 mg | ORAL_TABLET | Freq: Every day | ORAL | Status: DC
  Administered 2018-10-03: 10 mg via ORAL
  Filled 2018-10-03: qty 1

## 2018-10-03 MED ORDER — NITROGLYCERIN 0.4 MG SL SUBL: 0.4000 mg | SUBLINGUAL_TABLET | SUBLINGUAL | 0 refills | Status: AC | PRN

## 2018-10-03 MED ORDER — PRAVASTATIN SODIUM 20 MG PO TABS: 20.0000 mg | ORAL_TABLET | Freq: Every day | ORAL | 0 refills | Status: DC

## 2018-10-03 MED ORDER — AMLODIPINE BESYLATE 10 MG PO TABS: 10.0000 mg | ORAL_TABLET | Freq: Every day | ORAL | 0 refills | Status: DC

## 2018-10-03 MED ORDER — METOPROLOL TARTRATE 25 MG PO TABS: 25.0000 mg | ORAL_TABLET | Freq: Two times a day (BID) | ORAL | 0 refills | Status: DC

## 2018-10-03 MED ORDER — PRAVASTATIN SODIUM 10 MG PO TABS: 20.0000 mg | ORAL_TABLET | Freq: Every day | ORAL | Status: DC

## 2018-10-03 MED ORDER — ASPIRIN 81 MG PO TBEC: 81.0000 mg | DELAYED_RELEASE_TABLET | Freq: Every day | ORAL | 2 refills | Status: AC

## 2018-10-03 MED ORDER — TICAGRELOR 90 MG PO TABS: 90.0000 mg | ORAL_TABLET | Freq: Two times a day (BID) | ORAL | 0 refills | Status: DC

## 2018-10-03 MED ORDER — LISINOPRIL 10 MG PO TABS: 10.0000 mg | ORAL_TABLET | Freq: Every day | ORAL | 0 refills | Status: DC

## 2018-10-03 MED ORDER — POTASSIUM CHLORIDE CRYS ER 20 MEQ PO TBCR
40.0000 meq | EXTENDED_RELEASE_TABLET | Freq: Once | ORAL | Status: AC
  Administered 2018-10-03: 09:00:00 40 meq via ORAL
  Filled 2018-10-03: qty 2

## 2018-10-03 MED ORDER — LISINOPRIL 2.5 MG PO TABS: 2.5000 mg | ORAL_TABLET | Freq: Every day | ORAL | Status: DC

## 2018-10-03 MED ORDER — NITROGLYCERIN 0.4 MG SL SUBL: 0.4000 mg | SUBLINGUAL_TABLET | SUBLINGUAL | Status: DC | PRN

## 2018-10-03 NOTE — Progress Notes (Signed)
Progress Note  Patient Name: Sherri [name] Date of Encounter: 10/03/2018  Primary Cardiologist: New  Subjective   Some headache this morning. No chest pain.   Inpatient Medications    Scheduled Meds: . amLODipine  10 mg Oral Daily  . aspirin EC  81 mg Oral Daily  . buPROPion  150 mg Oral Daily  . docusate sodium  100 mg Oral BID  . enoxaparin (LOVENOX) injection  40 mg Subcutaneous Q24H  . influenza vac split quadrivalent PF  0.5 mL Intramuscular Tomorrow-1000  . metoprolol tartrate  25 mg Oral BID  . potassium chloride  40 mEq Oral Once  . sodium chloride flush  3 mL Intravenous Q12H  . ticagrelor  90 mg Oral BID  . valACYclovir  500 mg Oral Daily   Continuous Infusions: . sodium chloride     PRN Meds: sodium chloride, acetaminophen, butalbital-acetaminophen-caffeine, metaxalone, ondansetron (ZOFRAN) IV, oxyCODONE-acetaminophen **AND** oxyCODONE, sodium chloride flush   Vital Signs    Vitals:   10/02/18 1919 10/02/18 14-Apr-2001 10/02/18 2114-04-15 10/03/18 0618  BP: (!) 144/84 (!) 155/75 (!) 148/72 (!) 156/81  Pulse: 99 89 86 78  Resp:   14 16  Temp:   99.2 F (37.3 C) 98.6 F (37 C)  TempSrc:   Oral Oral  SpO2: 99% 98% 100% 100%  Weight:    95.4 kg  Height:        Intake/Output Summary (Last 24 hours) at 10/03/2018 0834 Last data filed at 10/02/2018 2128-04-14 Gross per 24 hour  Intake 579.12 ml  Output -  Net 579.12 ml   Last 3 Weights 10/03/2018 10/02/2018 10/02/2018  Weight (lbs) 210 lb 4.8 oz 213 lb 215 lb 4.8 oz  Weight (kg) 95.391 kg 96.616 kg 97.659 kg      Telemetry    SR, occasionasl PVCs - Personally Reviewed  ECG    n/a  Physical Exam   GEN: No acute distress.   Neck: No JVD Cardiac: RRR, no murmurs, rubs, or gallops.  Respiratory: Clear to auscultation bilaterally. GI: Soft, nontender, non-distended  MS: No edema; No deformity. Neuro:  Nonfocal  Psych: Normal affect   Labs    High Sensitivity Troponin:   Recent Labs  Lab 10/01/18 2055/04/15  10/01/18 2325 10/02/18 0520 10/02/18 0651  TROPONINIHS 47* 62* 85* 83*      Chemistry Recent Labs  Lab 10/01/18 Apr 15, 2055 10/02/18 0520 10/03/18 0251  NA 136 138 138  K 3.5 3.5 3.4*  CL 103 103 107  CO2 22 25 23   GLUCOSE 154* 119* 115*  BUN 15 12 9   CREATININE 0.75 0.65 0.68  CALCIUM 9.1 8.9 8.8*  GFRNONAA >60 >60 >60  GFRAA >60 >60 >60  ANIONGAP 11 10 8      Hematology Recent Labs  Lab 10/01/18 04/15/55 10/03/18 0251  WBC 6.6 6.9  RBC 4.20 3.85*  HGB 12.3 11.2*  HCT 36.6 33.9*  MCV 87.1 88.1  MCH 29.3 29.1  MCHC 33.6 33.0  RDW 13.2 13.0  PLT 254 222    BNPNo results for input(s): BNP, PROBNP in the last 168 hours.   DDimer No results for input(s): DDIMER in the last 168 hours.   Radiology    Dg Chest 2 View  Result Date: 10/01/2018 CLINICAL DATA:  Pt reports she began having nausea, chest pain and high bp yesterday while at her neuro doctor, states she tried to see her PCP but they cannot see her until 04-15-2022. States pain died down last night but today  it got worse again and she is having jaw pain and R arm pain today EXAM: CHEST - 2 VIEW COMPARISON:  12/20/2015 FINDINGS: Cardiac silhouette is normal in size. No mediastinal or hilar masses. No evidence of adenopathy. Clear lungs.  No pleural effusion or pneumothorax. Skeletal structures are intact. IMPRESSION: No active cardiopulmonary disease. Electronically Signed   By: Lajean Manes M.D.   On: 10/01/2018 21:20    Cardiac Studies    Patient Profile     Sherri [name] is a 63 y.o. female w/ h/o breast CA, HTN, migraines and chronic pain managed w/ chronic opioid therapy who is being seen today for the evaluation of CP at the request of [facility]. Pt has no prior h/o CAD, MI, HF or any other significant cardiac hx, although she does have +FHx early CAD.Admitted with chest pain.   Assessment & Plan    1. Chest pain/NSTEMI - cath this admit with 99% mid LAD, s/p DES x 2 - relatively mild trop elevation of 85 -  09/2018 echo LVEF 65-70% LV gram mentioned some apical hypokniesis but not seen on echo - medical therapy with ASA 81, lopressor 25mg  bid, brillinta 90mg  bid. Severe joint patins on atorvastatin before, try low dose pravastatin 20mg  daily. Start low dose lisinopril 10 mg daily.  - normal right radial site, healing well  2. Hyperlipidemia - LDL 136 this admit - severe joint pains on atorvastatin, she does not believe she has tried alternative statin before - trial of pravastatin 20, if tolerated could further titrate  3. HTN - if tolerates lisiniopril, could titrate over f/u and possibly come off amlodopine to simplify her regimen  4.Hypokalemia - already given 65mEq x 1 today   [place] for discharge from cardiac standpoint. Would recommend note clearing her from work for 3 weeks until her cardiology follow up.     For questions or updates, please contact [place] Please consult [email or web address] for contact info under        Signed, Carlyle Dolly, MD  10/03/2018, 8:34 AM

## 2018-10-03 NOTE — Progress Notes (Signed)
CARDIAC REHAB PHASE I   PRE:  Rate/Rhythm: Sinus Rhythm  BP:  Supine: 150/72     SaO2: 97% RA  MODE:  Ambulation: 200 ft   POST:  Rate/Rhythem: 76  BP:  Supine: 147/67     SaO2: 99% RA 0800-0900 Patient ambulated in the hallway with assistance times one. Complained about chronic back pain 8/10 and said that she developed a headache and felt a little dizzy upon return to the room. Denied having any chest pain.Tolerated ambulation fair.  Reviewed exercise guidelines will refer to phase 2 cardiac rehab in [place]. 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 Heart healthy diet, Mi booklet reviewed. Discussed importance of taking brillinta as prescribed. Asked nurse to notify case management to give patient coupon book. Reviewed use of sublingual nitroglycerin and when to call 911. Has stent card in wallet.  Harrell Gave RN BSN

## 2018-10-03 NOTE — Discharge Instructions (Signed)
Heart Attack °The heart is a muscle that needs oxygen to survive. A heart attack is a condition that occurs when your heart does not get enough oxygen. When this happens, the heart muscle begins to die. This can cause permanent damage if not treated right away. A heart attack is a medical emergency. °This condition may be called a myocardial infarction, or MI. It is also known as acute coronary syndrome (ACS). ACS is a term used to describe a group of conditions that affect blood flow to the heart. °What are the causes? °This condition may be caused by: °· Atherosclerosis. This occurs when a fatty substance called plaque builds up in the arteries and blocks or reduces blood supply to the heart. °· A blood clot. A blood clot can develop suddenly when plaque breaks up within an artery and blocks blood flow to the heart. °· Low blood pressure. °· An abnormal heartbeat (arrhythmia). °· Conditions that cause a decrease of oxygen to the heart, such as anemiaorrespiratory failure. °· A spasm, or severe tightening, of a blood vessel that cuts off blood flow to the heart. °· Tearing of a coronary artery (spontaneous coronary artery dissection). °· High blood pressure. °What increases the risk? °The following factors may make you more likely to develop this condition: °· Aging. The older you are, the higher your risk. °· Having a personal or family history of chest pain, heart attack, stroke, or narrowing of the arteries in the legs, arms, head, or stomach (peripheral artery disease). °· Being female. °· Smoking. °· Not getting regular exercise. °· Being overweight or obese. °· Having high blood pressure. °· Having high cholesterol (hypercholesterolemia). °· Having diabetes. °· Drinking too much alcohol. °· Using illegal drugs, such as cocaine or methamphetamine. °What are the signs or symptoms? °Symptoms of this condition may vary, depending on factors like gender and age. Symptoms may include: °· Chest pain. It may feel  like: °? Crushing or squeezing. °? Tightness, pressure, fullness, or heaviness. °· Pain in the arm, neck, jaw, back, or upper body. °· Shortness of breath. °· Heartburn or upset stomach. °· Nausea. °· Sudden cold sweats. °· Feeling tired. °· Sudden light-headedness. °How is this diagnosed? °This condition may be diagnosed through tests, such as: °· Electrocardiogram (ECG) to measure the electrical activity of your heart. °· Blood tests to check for cardiac markers. These chemicals are released by a damaged heart muscle. °· A test to evaluate blood flow and heart function (coronary angiogram). °· CT scan to see the heart more clearly. °· A test to evaluate the pumping action of the heart (echocardiogram). °How is this treated? °A heart attack must be treated as soon as possible. Treatment may include: °· Medicines to: °? Break up or dissolve blood clots (fibrinolytic therapy). °? Thin blood and help prevent blood clots. °? Treat blood pressure. °? Improve blood flow to the heart. °? Reduce pain. °? Reduce cholesterol. °· Angioplasty and stent placement. These are procedures to widen a blocked artery and keep it open. °· Coronary artery bypass graft, CABG, or open heart surgery. This enables blood to flow to the heart by going around the blocked part of the artery. °· Oxygen therapy if needed. °· Cardiac rehabilitation. This improves your health and well-being through exercise, education, and counseling. °Follow these instructions at home: °Medicines °· Take over-the-counter and prescription medicines only as told by your health care provider. °· Do not take the following medicines unless your health care provider says it is okay   to take them: °? NSAIDs, such as ibuprofen. °? Supplements that contain vitamin A, vitamin E, or both. °? Hormone replacement therapy that contains estrogen with or without progestin. °Lifestyle ° °· Do not use any products that contain nicotine or tobacco, such as cigarettes, e-cigarettes,  and chewing tobacco. If you need help quitting, ask your health care provider. °· Avoid secondhand smoke. °· Exercise regularly. Ask your health care provider about participating in a cardiac rehabilitation program that helps you start exercising safely after a heart attack. °· Eat a heart-healthy diet. Your health care provider will tell you what foods to eat. °· Maintain a healthy weight. °· Learn ways to manage stress. °· Do not use illegal drugs. °Alcohol use °· Do not drink alcohol if: °? Your health care provider tells you not to drink. °? You are pregnant, may be pregnant, or are planning to become pregnant. °· If you drink alcohol: °? Limit how much you use to: °§ 0-1 drink a day for women. °§ 0-2 drinks a day for men. °? Be aware of how much alcohol is in your drink. In the [place] one drink equals one 12 oz bottle of beer (355 mL), one 5 oz glass of wine (148 mL), or one 1½ oz glass of hard liquor (44 mL). °General instructions °· Work with your health care provider to manage any other conditions you have, such as high blood pressure or diabetes. These conditions affect your heart. °· Get screened for depression, and seek treatment if needed. °· Keep your vaccinations up to date. Get the flu vaccine every year. °· Keep all follow-up visits as told by your health care provider. This is important. °Contact a health care provider if: °· You feel overwhelmed or sad. °· You have trouble doing your daily activities. °Get help right away if: °· You have sudden, unexplained discomfort in your chest, arms, back, neck, jaw, or upper body. °· You have shortness of breath. °· You suddenly start to sweat or your skin gets clammy. °· You feel nauseous or you vomit. °· You have unexplained tiredness or weakness. °· You suddenly feel light-headed or dizzy. °· You notice your heart starts to beat fast or feels like it is skipping beats. °· You have blood pressure that is higher than 180/120. °These symptoms may represent a  serious problem that is an emergency. Do not wait to see if the symptoms will go away. Get medical help right away. Call your local emergency services (911 in the [place] Do not drive yourself to the hospital. °Summary °· A heart attack, also called myocardial infarction, is a condition that occurs when your heart does not get enough oxygen. This is caused by anything that blocks or reduces blood flow to the heart. °· Treatment is a combination of medicines and surgeries, if needed, to open the blocked arteries and restore blood flow to the heart. °· A heart attack is an emergency. Get help right away if you have sudden discomfort in your chest, arms, back, neck, jaw, or upper body. Seek help if you feel nauseous, you vomit, or you feel light-headed or dizzy. °This information is not intended to replace advice given to you by your health care provider. Make sure you discuss any questions you have with your health care provider. °Document Released: 12/24/2004 Document Revised: 04/02/2018 Document Reviewed: 04/06/2018 °Elsevier Patient Education © 2020 [employer] ° °Coronary Angiogram With Stent, Care After °This sheet gives you information about how to care for yourself after   your procedure. Your health care provider may also give you more specific instructions. If you have problems or questions, contact your health care provider. °What can I expect after the procedure? °After your procedure, it is common to have: °· Bruising in the area where a small, thin tube (catheter) was inserted. This usually fades within 1-2 weeks. °· Blood collecting in the tissue (hematoma) that may be painful to the touch. It should usually decrease in size and tenderness within 1-2 weeks. °Follow these instructions at home: °Insertion area care °· Do not take baths, swim, or use a hot tub until your health care provider approves. °· You may shower 24-48 hours after the procedure or as directed by your health care provider. °· Follow  instructions from your health care provider about how to take care of your incision. Make sure you: °? Wash your hands with soap and water before you change your bandage (dressing). If soap and water are not available, use hand sanitizer. °? Change your dressing as told by your health care provider. °? Leave stitches (sutures), skin glue, or adhesive strips in place. These skin closures may need to stay in place for 2 weeks or longer. If adhesive strip edges start to loosen and curl up, you may trim the loose edges. Do not remove adhesive strips completely unless your health care provider tells you to do that. °· Remove the bandage (dressing) and gently wash the catheter insertion site with plain soap and water. °· Pat the area dry with a clean towel. Do not rub the area, because that may cause bleeding. °· Do not apply powder or lotion to the incision area. °· Check your incision area every day for signs of infection. Check for: °? More redness, swelling, or pain. °? More fluid or blood. °? Warmth. °? Pus or a bad smell. °Activity °· Do not drive for 24 hours if you were given a medicine to help you relax (sedative). °· Do not lift anything that is heavier than 10 lb (4.5 kg) for 5 days after your procedure or as directed by your health care provider. °· Ask your health care provider when it is okay for you: °? To return to work or school. °? To resume usual physical activities or sports. °? To resume sexual activity. °Eating and drinking ° °· Eat a heart-healthy diet. This should include plenty of fresh fruits and vegetables. °· Avoid the following types of food: °? Food that is high in salt. °? Canned or highly processed food. °? Food that is high in saturated fat or sugar. °? Fried food. °· Limit alcohol intake to no more than 1 drink a day for non-pregnant women and 2 drinks a day for men. One drink equals 12 oz of beer, 5 oz of wine, or 1½ oz of hard liquor. °Lifestyle ° °· Do not use any products that contain  nicotine or tobacco, such as cigarettes and e-cigarettes. If you need help quitting, ask your health care provider. °· Take steps to manage and control your weight. °· Get regular exercise. °· Manage your blood pressure. °· Manage other health problems, such as diabetes. °General instructions °· Take over-the-counter and prescription medicines only as told by your health care provider. Blood thinners may be prescribed after your procedure to improve blood flow through the stent. °· If you need an MRI after your heart stent has been placed, be sure to tell the health care provider who orders the MRI that you have a heart   stent. °· Keep all follow-up visits as directed by your health care provider. This is important. °Contact a health care provider if: °· You have a fever. °· You have chills. °· You have increased bleeding from the catheter insertion area. Hold pressure on the area. °Get help right away if: °· You develop chest pain or shortness of breath. °· You feel faint or you pass out. °· You have unusual pain at the catheter insertion area. °· You have redness, warmth, or swelling at the catheter insertion area. °· You have drainage (other than a small amount of blood on the dressing) from the catheter insertion area. °· The catheter insertion area is bleeding, and the bleeding does not stop after 30 minutes of holding steady pressure on the area. °· You develop bleeding from any other place, such as from your rectum. There may be bright red blood in your urine or stool, or it may appear as black, tarry stool. °This information is not intended to replace advice given to you by your health care provider. Make sure you discuss any questions you have with your health care provider. °Document Released: 07/13/2004 Document Revised: 12/06/2016 Document Reviewed: 09/21/2015 °Elsevier Patient Education © 2020 [employer] ° °

## 2018-10-03 NOTE — Discharge Summary (Signed)
Physician Discharge Summary  Sherri [name] [id number] DOB: [birth year] DOA: 10/01/2018  PCP: Harrison Mons, PA  Admit date: 10/01/2018 Discharge date: 10/03/2018  Time spent: 40 minutes  Recommendations for Outpatient Follow-up:  1. Follow up outpatient CBC/CMP (started on lisinopril, needs repeat BMP within [name] few weeks) 2. Follow up with cardiology outpatient  3. Follow up tolerance of pravastatin, uptitrate as able 4. Prediabetes, follow with PCP outpatient 5. Follow BP with new BP agents   Discharge Diagnoses:  Principal Problem:   NSTEMI (non-ST elevated myocardial infarction) [name]) Active Problems:   Depression   Essential (primary) hypertension   Chronic bilateral thoracic back pain   Migraines   Discharge Condition: stable  Diet recommendation: heart healthy  Filed Weights   10/02/18 0558 10/02/18 1438 10/03/18 0618  Weight: 97.7 kg 96.6 kg 95.4 kg    History of present illness:  Sherri [name] 63 y.o.femalewith medical history significant formigraine headaches, hypertension, hyperlipidemia with reported statin intolerance, depression, and chronic back pain, presenting to the emergency department for evaluation of chest pain.Patient was at an outpatient neurology appointment on 09/30/2018 when she developed some mild nausea and chest discomfort. She was noted to have elevated blood pressure at that time as well. Symptoms seem to ease off before coming back yesterday evening, no with radiation to the right arm and jaw. She seems to feel better with rest, but the chest discomfort has been fairly constant since yesterday evening.She may have had some mild dyspnea associated with this, but no significant cough and she also denies fevers. She denies any lower extremity swelling or tenderness. She has never had these symptoms previously.  ED Course:Upon arrival to the ED, patient is found to be afebrile, saturating well on room air, slightly  tachycardic, and with blood pressure 185/95. EKG features sinus tachycardia with rate 105 and nonspecific T wave abnormalities. Chest x-ray is negative for acute cardiopulmonary disease. Chemistry panel and CBC are unremarkable. High-sensitivity troponin is elevated to 47, then increase to 62 2-1/2 hours later. Patient was given 324 mg of aspirin, morphine, started on heparin infusion, and started on nitroglycerin infusion in the ED. ED physician discussed the case with cardiology who recommended Aveah Castell medical admission and for patient to be n.p.o. for possible catheterization.  She was admitted for an NSTEMI.  She had cardiac cath and had DES x2 to LAD.  Started on aspirin and brilinta.  Discharged on 9/26 with plans for outpatient follow up.  Hospital Course:  1.NSTEMI -Presents with chest pain since 9/23, becoming more severe last night, and found to have non-specific T-wave abnormalities on EKG, unremarkable CXR - troponin from 47 ->62 -> 85 -She was treated with ASA 324 mg in ED and started on IV heparin and NTG infusions -Cardiology was consulted and pt has now undergone catheterization with severe single vessel CAD with 99% mid LAD stenosis.  S/p DES x2.  Continue aspirin and ticagrelor x 12 months. -continue metoprolol.  start pravastatin - follow for ability to tolerate as she hasn't started atorvastatin before.  Start on lisinopril.  - a1c is 5.7, ldl 136, HDL 72 - continue metoprolol  2.Hypertension -BP elevated to 185/95 in ED, improved some with NTG but still elevated -Start metoprolol, continue Norvasc, start lisinopril  # Prediabetes: follow with PCP outpatient # HLD: will need to discuss further as noted above - trial of pravastatin  Procedures: Conclusions: 1. Severe single vessel coronary artery disease with 99% mid LAD stenosis. 2. Mild, non-obstructive coronary artery  disease involving the LCx and RCA. 3. Apical hypokinesis with otherwise preserved left  ventricular systolic function. 4. Successful PCI to the mid LAD using overlapping Resolute Onyx 3.5 x 12 mm (proximal) and 3.0 x 18 mm (distal) drug-eluting stents with 0% residual stenosis and TIMI-3 flow.  Second stent was placed proximally due to focal hazy stenosis of proximal edge of the more distal stent concerning for plaque shift or small dissection.  D2 was jailed by the more proximal stent without significant change in mild ostial stenosis.  Recommendations: 1. Dual antiplatelet therapy with aspirin and ticagrelor for at least 12 months. 2. Aggressive secondary prevention.  Echo IMPRESSIONS    1. Left ventricular ejection fraction, by visual estimation, is 65 to 70%. The left ventricle has normal function. Normal left ventricular size. There is no left ventricular hypertrophy.  2. Left ventricular diastolic Doppler parameters are consistent with impaired relaxation pattern of LV diastolic filling.  3. Global right ventricle has normal systolic function.The right ventricular size is normal. No increase in right ventricular wall thickness. Consultations:  cardiology  Discharge Exam: Vitals:   10/03/18 0618 10/03/18 0847  BP: (!) 156/81 (!) 147/67  Pulse: 78 77  Resp: 16   Temp: 98.6 F (37 C)   SpO2: 100%    Feels better - CP resolved Discussed d/c plan  General: No acute distress. Cardiovascular: Heart sounds show [name] regular rate, and rhythm. Well appearing R radial site. Lungs: Clear to auscultation bilaterally Abdomen: Soft, nontender, nondistended  Neurological: Alert and oriented 3. Moves all extremities 4. Cranial nerves II through XII grossly intact. Skin: Warm and dry. No rashes or lesions. Extremities: No clubbing or cyanosis. No edema.    Discharge Instructions   Discharge Instructions    Amb Referral to Cardiac Rehabilitation   Complete by: As directed    Diagnosis:  Coronary Stents NSTEMI     After initial evaluation and assessments completed:  Virtual Based Care may be provided alone or in conjunction with Phase 2 Cardiac Rehab based on patient barriers.: Yes   Call MD for:  difficulty breathing, headache or visual disturbances   Complete by: As directed    Call MD for:  extreme fatigue   Complete by: As directed    Call MD for:  hives   Complete by: As directed    Call MD for:  persistant dizziness or light-headedness   Complete by: As directed    Call MD for:  persistant nausea and vomiting   Complete by: As directed    Call MD for:  redness, tenderness, or signs of infection (pain, swelling, redness, odor or green/yellow discharge around incision site)   Complete by: As directed    Call MD for:  severe uncontrolled pain   Complete by: As directed    Call MD for:  temperature >100.4   Complete by: As directed    Diet - low sodium heart healthy   Complete by: As directed    Discharge instructions   Complete by: As directed    You were seen for Anthany Thornhill heart attack.  You had Antonis Lor catheterization and stent placement.  You've been started on aspirin and brilinta.  These should continue uninterrupted for 12 months.  Please follow up with cardiology for refills.    You've been started on metoprolol, lisinopril, and amlodipine for your blood pressure.  Please follow up with cardiology or your PCP to help further adjust these medications as needed.  You've been started on pravastatin for your  cholesterol and to help prevent heart attacks and strokes.  Please follow up with cardiology to make sure you continue to tolerate this.  Please stop your meloxicam.    Return for new, recurrent, or worsening symptoms.  Please ask your PCP to request records from this hospitalization so they know what was done and what the next steps will be.   Increase activity slowly   Complete by: As directed      Allergies as of 10/03/2018      Reactions   Effexor [venlafaxine Hydrochloride] Shortness Of Breath, Rash, Other (See Comments)    HALLUCINATIONS   Iodine Anaphylaxis   Shellfish Allergy Anaphylaxis, Other (See Comments)   Codeine Swelling, Rash   SWELLING REACTION UNSPECIFIED    Flexeril [cyclobenzaprine Hcl] Other (See Comments)   HALLUCINATIONS AND "OUT OF CONTROL"   Hydromorphone Other (See Comments)   HALLUCINATIONS   Nubain [nalbuphine Hcl] Other (See Comments)   HALLUCINATIONS   Atorvastatin Other (See Comments)   Severe joint pain   Erythromycin Rash      Medication List    STOP taking these medications   meloxicam 15 MG tablet Commonly known as: MOBIC     TAKE these medications   AIMOVIG [place] Inject into the skin every 30 (thirty) days. Unknown dose Given at Neurologist   amLODipine 10 MG tablet Commonly known as: NORVASC Take 1 tablet (10 mg total) by mouth daily. Start taking on: October 04, 2018 What changed:   medication strength  how much to take   aspirin 81 MG EC tablet Take 1 tablet (81 mg total) by mouth daily. Start taking on: October 04, 2018 What changed:   medication strength  how much to take  when to take this   buPROPion 150 MG 24 hr tablet Commonly known as: WELLBUTRIN XL Take 1 tablet (150 mg total) by mouth daily.   Butalbital-APAP-Caffeine 50-325-40 MG capsule Take 1 capsule by mouth every 6 (six) hours as needed for headache.   docusate sodium 100 MG capsule Commonly known as: COLACE Take 1 capsule (100 mg total) by mouth 2 (two) times daily.   lisinopril 10 MG tablet Commonly known as: ZESTRIL Take 1 tablet (10 mg total) by mouth daily. Start taking on: October 04, 2018   metaxalone 800 MG tablet Commonly known as: SKELAXIN Take 1 tablet (800 mg total) by mouth 3 (three) times daily as needed for pain.   metoprolol tartrate 25 MG tablet Commonly known as: LOPRESSOR Take 1 tablet (25 mg total) by mouth 2 (two) times daily.   nitroGLYCERIN 0.4 MG SL tablet Commonly known as: NITROSTAT Place 1 tablet (0.4 mg total) under the tongue every 5  (five) minutes as needed for chest pain.   oxyCODONE-acetaminophen 10-325 MG tablet Commonly known as: PERCOCET Take 1-2 tablets by mouth every 4 (four) hours as needed for pain.   pravastatin 20 MG tablet Commonly known as: PRAVACHOL Take 1 tablet (20 mg total) by mouth daily at 6 PM.   ticagrelor 90 MG Tabs tablet Commonly known as: BRILINTA Take 1 tablet (90 mg total) by mouth 2 (two) times daily.   valACYclovir 500 MG tablet Commonly known as: VALTREX Take 1 PO QD for suppression; INCREASE to BID x 3 days PRN outbreak What changed:   how much to take  how to take this  when to take this  additional instructions      Allergies  Allergen Reactions  . Effexor [Venlafaxine Hydrochloride] Shortness Of Breath, Rash and Other (  See Comments)    HALLUCINATIONS  . Iodine Anaphylaxis  . Shellfish Allergy Anaphylaxis and Other (See Comments)  . Codeine Swelling and Rash    SWELLING REACTION UNSPECIFIED   . Flexeril [Cyclobenzaprine Hcl] Other (See Comments)    HALLUCINATIONS AND "OUT OF CONTROL"  . Hydromorphone Other (See Comments)    HALLUCINATIONS  . Nubain [Nalbuphine Hcl] Other (See Comments)    HALLUCINATIONS  . Atorvastatin Other (See Comments)    Severe joint pain  . Erythromycin Rash   Follow-up Information    Hilty, Nadean Corwin, MD Follow up.   Specialty: Cardiology Why: office will contact you for follow up in 2 weeks Contact information: [place] [address] [phone]            The results of significant diagnostics from this hospitalization (including imaging, microbiology, ancillary and laboratory) are listed below for reference.    Significant Diagnostic Studies: Dg Chest 2 View  Result Date: 10/01/2018 CLINICAL DATA:  Pt reports she began having nausea, chest pain and high bp yesterday while at her neuro doctor, states she tried to see her PCP but they cannot see her until 21-Apr-2022. States pain died down last  night but today it got worse again and she is having jaw pain and R arm pain today EXAM: CHEST - 2 VIEW COMPARISON:  12/20/2015 FINDINGS: Cardiac silhouette is normal in size. No mediastinal or hilar masses. No evidence of adenopathy. Clear lungs.  No pleural effusion or pneumothorax. Skeletal structures are intact. IMPRESSION: No active cardiopulmonary disease. Electronically Signed   By: Lajean Manes M.D.   On: 10/01/2018 21:20    Microbiology: Recent Results (from the past 240 hour(s))  SARS Coronavirus 2 [facility] order, Performed in [facility] hospital lab) Nasopharyngeal Nasopharyngeal Swab     Status: None   Collection Time: 10/02/18  5:07 AM   Specimen: Nasopharyngeal Swab  Result Value Ref Range Status   SARS Coronavirus 2 NEGATIVE NEGATIVE Final    Comment: (NOTE) If result is NEGATIVE SARS-CoV-2 target nucleic acids are NOT DETECTED. The SARS-CoV-2 RNA is generally detectable in upper and lower  respiratory specimens during the acute phase of infection. The lowest  concentration of SARS-CoV-2 viral copies this assay can detect is 250  copies / mL. Ciana Simmon negative result does not preclude SARS-CoV-2 infection  and should not be used as the sole basis for treatment or other  patient management decisions.  Telisa Ohlsen negative result may occur with  improper specimen collection / handling, submission of specimen other  than nasopharyngeal swab, presence of viral mutation(s) within the  areas targeted by this assay, and inadequate number of viral copies  (<250 copies / mL). Xaivier Malay negative result must be combined with clinical  observations, patient history, and epidemiological information. If result is POSITIVE SARS-CoV-2 target nucleic acids are DETECTED. The SARS-CoV-2 RNA is generally detectable in upper and lower  respiratory specimens dur ing the acute phase of infection.  Positive  results are indicative of active infection with SARS-CoV-2.  Clinical  correlation with patient history and  other diagnostic information is  necessary to determine patient infection status.  Positive results do  not rule out bacterial infection or co-infection with other viruses. If result is PRESUMPTIVE POSTIVE SARS-CoV-2 nucleic acids MAY BE PRESENT.   Deyana Wnuk presumptive positive result was obtained on the submitted specimen  and confirmed on repeat testing.  While 04/20/17 novel coronavirus  (SARS-CoV-2) nucleic acids may be present in the submitted sample  additional confirmatory testing may be necessary for epidemiological  and / or clinical management purposes  to differentiate between  SARS-CoV-2 and other Sarbecovirus currently known to infect humans.  If clinically indicated additional testing with an alternate test  methodology [phone]) is advised. The SARS-CoV-2 RNA is generally  detectable in upper and lower respiratory sp ecimens during the acute  phase of infection. The expected result is Negative. Fact Sheet for Patients:  StrictlyIdeas.no Fact Sheet for Healthcare Providers: BankingDealers.co.za This test is not yet approved or cleared by the [place] FDA and has been authorized for detection and/or diagnosis of SARS-CoV-2 by FDA under an Emergency Use Authorization (EUA).  This EUA will remain in effect (meaning this test can be used) for the duration of the COVID-19 declaration under Section 564(b)(1) of the Act, 21 U.S.C. section 360bbb-3(b)(1), unless the authorization is terminated or revoked sooner. Performed at [facility], [place] [address], [place], [place] [address]      Labs: Basic Metabolic Panel: Recent Labs  Lab 10/01/18 2057 10/02/18 0520 10/03/18 0251  NA 136 138 138  K 3.5 3.5 3.4*  CL 103 103 107  CO2 22 25 23   GLUCOSE 154* 119* 115*  BUN 15 12 9   CREATININE 0.75 0.65 0.68  CALCIUM 9.1 8.9 8.8*  MG  --   --  2.1   Liver Function Tests: No results for input(s): AST, ALT, ALKPHOS, BILITOT, PROT,  ALBUMIN in the last 168 hours. No results for input(s): LIPASE, AMYLASE in the last 168 hours. No results for input(s): AMMONIA in the last 168 hours. CBC: Recent Labs  Lab 10/01/18 2057 10/03/18 0251  WBC 6.6 6.9  HGB 12.3 11.2*  HCT 36.6 33.9*  MCV 87.1 88.1  PLT 254 222   Cardiac Enzymes: No results for input(s): CKTOTAL, CKMB, CKMBINDEX, TROPONINI in the last 168 hours. BNP: BNP (last 3 results) No results for input(s): BNP in the last 8760 hours.  ProBNP (last 3 results) No results for input(s): PROBNP in the last 8760 hours.  CBG: No results for input(s): GLUCAP in the last 168 hours.     Signed:  Fayrene Helper MD.  [facility] 10/03/2018, 11:52 AM

## 2018-10-05 ENCOUNTER — Telehealth (HOSPITAL_COMMUNITY): Payer: Self-pay

## 2018-10-05 NOTE — Telephone Encounter (Signed)
Per ph. 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

## 2018-10-06 ENCOUNTER — Telehealth: Payer: Self-pay | Admitting: *Deleted

## 2018-10-06 NOTE — Telephone Encounter (Signed)
Botox charge sheet completed, signed, and given to Andee Poles, Scientist, forensic.   Dx: [id number]

## 2018-10-12 NOTE — Telephone Encounter (Signed)
I called to schedule the patient for her Botox injection but she did not answer so I left a VM asking her to call me back.

## 2018-10-19 ENCOUNTER — Ambulatory Visit: Admitting: Physician Assistant

## 2018-10-19 NOTE — Telephone Encounter (Signed)
I called to schedule the patient for her Botox injections, she did not answer so I left a VM asking her to call us back. DW

## 2018-10-26 ENCOUNTER — Ambulatory Visit (INDEPENDENT_AMBULATORY_CARE_PROVIDER_SITE_OTHER): Admitting: Physician Assistant

## 2018-10-26 ENCOUNTER — Encounter: Payer: Self-pay | Admitting: Physician Assistant

## 2018-10-26 ENCOUNTER — Other Ambulatory Visit: Payer: Self-pay

## 2018-10-26 VITALS — BP 138/80 | HR 72 | Temp 97.5°F | Ht 68.0 in | Wt 211.0 lb

## 2018-10-26 DIAGNOSIS — I10 Essential (primary) hypertension: Secondary | ICD-10-CM | POA: Diagnosis not present

## 2018-10-26 DIAGNOSIS — I25119 Atherosclerotic heart disease of native coronary artery with unspecified angina pectoris: Secondary | ICD-10-CM | POA: Diagnosis not present

## 2018-10-26 DIAGNOSIS — E785 Hyperlipidemia, unspecified: Secondary | ICD-10-CM

## 2018-10-26 MED ORDER — TICAGRELOR 90 MG PO TABS: 90.0000 mg | ORAL_TABLET | Freq: Two times a day (BID) | ORAL | 3 refills | Status: DC

## 2018-10-26 MED ORDER — METOPROLOL TARTRATE 50 MG PO TABS: 50.0000 mg | ORAL_TABLET | Freq: Two times a day (BID) | ORAL | 3 refills | Status: DC

## 2018-10-26 MED ORDER — AMLODIPINE BESYLATE 10 MG PO TABS: 10.0000 mg | ORAL_TABLET | Freq: Every day | ORAL | 3 refills | Status: DC

## 2018-10-26 NOTE — Progress Notes (Signed)
Cardiology Office Note    Date:  10/28/2018   ID:  Sherri [name], DOB 1955/02/19, MRN [id number]  PCP:  Harrison Mons, [place]  Cardiologist:  Plan to set up with Dr. Debara Pickett  Chief Complaint  Patient presents with   Follow-up    Post hospital. Patient sayds that she feels a flutter in her chest at times.   Shortness of Breath   Nausea    History of Present Illness:  Sherri [name] is a 63 y.o. female with past medical history of breast cancer, hypertension, migraine and chronic pain management was opioid who presented to the hospital on 10/02/2018 was chest pain.  EKG showed T wave inversion in the inferior leads.  Troponin was minimally elevated.  Echocardiogram obtained on 10/02/2018 showed EF 65 to 70%, impaired relaxation, otherwise no significant valve issue.  Subsequent cardiac catheterization performed on the same day showed a mild nonobstructive disease in the left circumflex and RCA, severe single-vessel CAD with 99% mid LAD stenosis treated with resolute Onyx 3.5 x 12 mm and 3.0 x 18 mm DES.  D2 was jailed by the proximal stent without significant change in the mild ostial stenosis.  Patient was started on aspirin and Brilinta after the procedure.  She was also started on pravastatin due to inability to tolerate the Lipitor in the past.  Patient presents today for cardiology office visit.  Since starting on the pravastatin, she has been having significant myalgia.  I will take her off of the pravastatin and refer her to the lipid clinic.  I also plan to set her up with Dr. Debara Pickett as her primary cardiologist as both Dr. Harl Bowie and Dr. Saunders Revel who saw her in the hospital does not work in [place].  She does not have any lower extremity edema, orthopnea or PND since discharge.  We discussed the importance of compliance with aspirin and Brilinta.  Brilinta has very costly for her, we have initiated medication assistance program.  If she does not qualify for medication assistance program,  I plan to switch her to Plavix.  She is intolerant of lisinopril, I would discontinue the lisinopril and increase the metoprolol.   Past Medical History:  Diagnosis Date   Anxiety    Arthritis    Breast pain, right    Cancer ([place])    ? breast, cervical    Closed left ankle fracture 12/17/2015   Depression    situational   Hypertension    not taken atenolol or lisinopril in >17mos, no money   Migraines    Pneumonia    Ruptured disk     Past Surgical History:  Procedure Laterality Date   AUGMENTATION MAMMAPLASTY Right    1979   BREAST SURGERY     lumpectomy in 20s for benign mass with implant placement (subglandular)   CARPAL TUNNEL RELEASE     right wrist   CARPAL TUNNEL RELEASE Right 09/02/2014   Procedure: RIGHT CARPAL TUNNEL RELEASE;  Surgeon: Melrose Nakayama, MD;  Location: [facility];  Service: Orthopedics;  Laterality: Right;   CERVICAL CONE BIOPSY     COLONOSCOPY     CORONARY STENT INTERVENTION N/A 10/02/2018   Procedure: CORONARY STENT INTERVENTION;  Surgeon: Nelva Bush, MD;  Location: [facility];  Service: Cardiovascular;  Laterality: N/A;   KNEE ARTHROSCOPY Left 07/1996   KNEE ARTHROSCOPY Right 1998   LEFT HEART CATH AND CORONARY ANGIOGRAPHY N/A 10/02/2018   Procedure: LEFT HEART CATH AND CORONARY ANGIOGRAPHY;  Surgeon:  End, Harrell Gave, MD;  Location: [facility];  Service: Cardiovascular;  Laterality: N/A;   ORIF ANKLE FRACTURE Left 12/17/2015   Procedure: OPEN REDUCTION INTERNAL FIXATION (ORIF) ANKLE FRACTURE;  Surgeon: Milly Jakob, MD;  Location: [facility];  Service: Orthopedics;  Laterality: Left;   SPINE SURGERY  72011   L2-L5 stabilization   TOTAL HIP ARTHROPLASTY Right 10/15/2016   TOTAL HIP ARTHROPLASTY Right 10/15/2016   Procedure: TOTAL HIP ARTHROPLASTY ANTERIOR APPROACH;  Surgeon: Melrose Nakayama, MD;  Location: [facility];  Service: Orthopedics;  Laterality: Right;    Current Medications: Outpatient  Medications Prior to Visit  Medication Sig Dispense Refill   aspirin EC 81 MG EC tablet Take 1 tablet (81 mg total) by mouth daily. 30 tablet 2   buPROPion (WELLBUTRIN XL) 150 MG 24 hr tablet Take 1 tablet (150 mg total) by mouth daily. 90 tablet 3   Butalbital-APAP-Caffeine 50-325-40 MG capsule Take 1 capsule by mouth every 6 (six) hours as needed for headache.      docusate sodium (COLACE) 100 MG capsule Take 1 capsule (100 mg total) by mouth 2 (two) times daily. 30 capsule 0   Erenumab-aooe (AIMOVIG [place]) Inject into the skin every 30 (thirty) days. Unknown dose Given at Neurologist     metaxalone (SKELAXIN) 800 MG tablet Take 1 tablet (800 mg total) by mouth 3 (three) times daily as needed for pain. 90 tablet 3   nitroGLYCERIN (NITROSTAT) 0.4 MG SL tablet Place 1 tablet (0.4 mg total) under the tongue every 5 (five) minutes as needed for chest pain. 15 tablet 0   oxyCODONE-acetaminophen (PERCOCET) 10-325 MG tablet Take 1-2 tablets by mouth every 4 (four) hours as needed for pain. 40 tablet 0   valACYclovir (VALTREX) 500 MG tablet Take 1 PO QD for suppression; INCREASE to BID x 3 days PRN outbreak (Patient taking differently: Take 500 mg by mouth daily. ) 100 tablet 3   amLODipine (NORVASC) 10 MG tablet Take 1 tablet (10 mg total) by mouth daily. 30 tablet 0   lisinopril (ZESTRIL) 10 MG tablet Take 1 tablet (10 mg total) by mouth daily. 30 tablet 0   metoprolol tartrate (LOPRESSOR) 25 MG tablet Take 1 tablet (25 mg total) by mouth 2 (two) times daily. 60 tablet 0   pravastatin (PRAVACHOL) 20 MG tablet Take 1 tablet (20 mg total) by mouth daily at 6 PM. 30 tablet 0   ticagrelor (BRILINTA) 90 MG TABS tablet Take 1 tablet (90 mg total) by mouth 2 (two) times daily. 60 tablet 0   No facility-administered medications prior to visit.      Allergies:   Effexor [venlafaxine hydrochloride], Iodine, Shellfish allergy, Codeine, Flexeril [cyclobenzaprine hcl], Hydromorphone, Nubain  [nalbuphine hcl], Atorvastatin, and Erythromycin   Social History   Socioeconomic History   Marital status: Widowed    Spouse name: Patrick Jupiter   Number of children: 0   Years of education: 15   Highest education level: Some college, no degree  Occupational History    Employer: [employer]   Social Designer, fashion/clothing strain: Somewhat hard   Food insecurity    Worry: Sometimes true    Inability: Sometimes true   Transportation needs    Medical: Yes    Non-medical: Yes  Tobacco Use   Smoking status: Former Smoker    Years: 1.00    Types: Cigarettes    Quit date: 01/08/1976    Years since quitting: 42.8   Smokeless tobacco: Never Used  Substance and  Sexual Activity   Alcohol use: No    Alcohol/week: 0.0 standard drinks   Drug use: No   Sexual activity: Not on file  Lifestyle   Physical activity    Days per week: 0 days    Minutes per session: 0 min   Stress: Rather much  Relationships   Social connections    Talks on phone: More than three times a week    Gets together: Never    Attends religious service: Never    Active member of club or organization: Yes    Attends meetings of clubs or organizations: More than 4 times per year    Relationship status: Widowed  Other Topics Concern   Not on file  Social History Narrative   Husband died 2011-10-05 of end-stage COPD.  Adversarial relationship with his ex-wife who lives in a house he was paying for.  Attorneys for both parties are involved in attempt to settle the conflict and financial matters of his will.         Update 09/30/2018   Right handed   Caffeine: 1 cup of coffee/day   Lives alone     Family History:  The patient's family history includes Breast cancer (age of onset: 62) in her mother; Cancer in her mother and sister; Diabetes in her mother; Glaucoma in her mother; Gout in her father; Heart disease in her mother; Migraines in her brother, maternal grandmother, mother, and sister.   ROS:    Please see the history of present illness.    ROS All other systems reviewed and are negative.   PHYSICAL EXAM:   VS:  BP 138/80 (BP Location: Left Arm, Patient Position: Sitting, Cuff Size: Normal)    Pulse 72    Temp (!) 97.5 F (36.4 C)    Ht 5\' 8"  (1.727 m)    Wt 211 lb (95.7 kg)    BMI 32.08 kg/m    GEN: Well nourished, well developed, in no acute distress  HEENT: normal  Neck: no JVD, carotid bruits, or masses Cardiac: RRR; no murmurs, rubs, or gallops,no edema  Respiratory:  clear to auscultation bilaterally, normal work of breathing GI: soft, nontender, nondistended, + BS MS: no deformity or atrophy  Skin: warm and dry, no rash Neuro:  Alert and Oriented x 3, Strength and sensation are intact Psych: euthymic mood, full affect  Wt Readings from Last 3 Encounters:  10/26/18 211 lb (95.7 kg)  2018-10-05 210 lb 4.8 oz (95.4 kg)  09/30/18 213 lb (96.6 kg)      Studies/Labs Reviewed:   EKG:  EKG is ordered today.  The ekg ordered today demonstrates normal sinus rhythm, nonspecific T wave changes  Recent Labs: 2018/10/05: BUN 9; Creatinine, Ser 0.68; Hemoglobin 11.2; Magnesium 2.1; Platelets 222; Potassium 3.4; Sodium 138   Lipid Panel    Component Value Date/Time   CHOL 215 (H) 10/02/2018 0521   CHOL 195 05/06/2017 1343   TRIG 35 10/02/2018 0521   HDL 72 10/02/2018 0521   HDL 71 05/06/2017 1343   CHOLHDL 3.0 10/02/2018 0521   VLDL 7 10/02/2018 0521   LDLCALC 136 (H) 10/02/2018 0521   LDLCALC 101 (H) 05/06/2017 1343    Additional studies/ records that were reviewed today include:  Echo 10/02/2018 IMPRESSIONS    1. Left ventricular ejection fraction, by visual estimation, is 65 to 70%. The left ventricle has normal function. Normal left ventricular size. There is no left ventricular hypertrophy.  2. Left ventricular diastolic Doppler parameters are  consistent with impaired relaxation pattern of LV diastolic filling.  3. Global right ventricle has normal systolic  function.The right ventricular size is normal. No increase in right ventricular wall thickness.   Cath 10/02/2018 Conclusions: 1. Severe single vessel coronary artery disease with 99% mid LAD stenosis. 2. Mild, non-obstructive coronary artery disease involving the LCx and RCA. 3. Apical hypokinesis with otherwise preserved left ventricular systolic function. 4. Successful PCI to the mid LAD using overlapping Resolute Onyx 3.5 x 12 mm (proximal) and 3.0 x 18 mm (distal) drug-eluting stents with 0% residual stenosis and TIMI-3 flow.  Second stent was placed proximally due to focal hazy stenosis of proximal edge of the more distal stent concerning for plaque shift or small dissection.  D2 was jailed by the more proximal stent without significant change in mild ostial stenosis.  Recommendations: 1. Dual antiplatelet therapy with aspirin and ticagrelor for at least 12 months. 2. Aggressive secondary prevention.   ASSESSMENT:    1. Coronary artery disease involving native coronary artery of native heart with angina pectoris ([place])   2. Essential (primary) hypertension   3. Hyperlipidemia, unspecified hyperlipidemia type      PLAN:  In order of problems listed above:  5. CAD: We discussed the importance of compliance with aspirin and Brilinta.  I have filled out medication assistance form for her Brilinta.  However you she cannot get any medication assistance, I plan to switch her to Plavix instead.  6. Hypertension: She is intolerant of the lisinopril, will increase metoprolol to 50 mg twice daily.  7. Hyperlipidemia: She was started on pravastatin in the hospital, however developed significant myalgia.  She is intolerant of the Lipitor as well.  I will refer her to lipid clinic with Dr. Debara Pickett for management of cholesterol.  Dr. Debara Pickett will also be her primary cardiologist since Dr. Harl Bowie and Dr. Saunders Revel who saw her in the hospital does not work in [place] area.    Medication  Adjustments/Labs and Tests Ordered: Current medicines are reviewed at length with the patient today.  Concerns regarding medicines are outlined above.  Medication changes, Labs and Tests ordered today are listed in the Patient Instructions below. Patient Instructions   Medication Instructions:   INCREASE METOPROLOL TARTRATE TO 50 MG 2 TIMES A DAY  DISCONTINUE LISINOPROL  DISCONTINUE PRAVASTATIN   *If you need a refill on your cardiac medications before your next appointment, please call your pharmacy*  Lab Work: NONE ordered at this time of appointment   If you have labs (blood work) drawn today and your tests are completely normal, you will receive your results only by:  Ethel (if you have MyChart) OR  A paper copy in the mail If you have any lab test that is abnormal or we need to change your treatment, we will call you to review the results.  Testing/Procedures: NONE ordered at this time of appointment   Follow-Up: At [facility], you and your health needs are our priority.  As part of our continuing mission to provide you with exceptional heart care, we have created designated Provider Care Teams.  These Care Teams include your primary Cardiologist (physician) and Advanced Practice Providers (APPs -  Physician Assistants and Nurse Practitioners) who all work together to provide you with the care you need, when you need it.  Your next appointment:   Follow up in 2-3 weeks with Almyra Deforest, PA-C Follow up with Dr. Debara Pickett in 6 weeks for Lipid Clinic  The format for your  next appointment:   In Person  Provider:   You may see Pixie Casino, MD and Almyra Deforest, PA-C   Other Instructions      Signed, Almyra Deforest, [place]  10/28/2018 8:49 AM    [facility] [address], [place], [place]  [address] Phone: [phone]; Fax: [phone]

## 2018-10-26 NOTE — Patient Instructions (Addendum)
  Medication Instructions:   INCREASE METOPROLOL TARTRATE TO 50 MG 2 TIMES A DAY  DISCONTINUE LISINOPROL  DISCONTINUE PRAVASTATIN   *If you need a refill on your cardiac medications before your next appointment, please call your pharmacy*  Lab Work: NONE ordered at this time of appointment   If you have labs (blood work) drawn today and your tests are completely normal, you will receive your results only by: Marland Kitchen MyChart Message (if you have MyChart) OR . A paper copy in the mail If you have any lab test that is abnormal or we need to change your treatment, we will call you to review the results.  Testing/Procedures: NONE ordered at this time of appointment   Follow-Up: At [facility], you and your health needs are our priority.  As part of our continuing mission to provide you with exceptional heart care, we have created designated Provider Care Teams.  These Care Teams include your primary Cardiologist (physician) and Advanced Practice Providers (APPs -  Physician Assistants and Nurse Practitioners) who all work together to provide you with the care you need, when you need it.  Your next appointment:   Follow up in 2-3 weeks with Almyra Deforest, PA-C Follow up with Dr. Debara Pickett in 6 weeks for Lipid Clinic  The format for your next appointment:   In Person  Provider:   You may see Pixie Casino, MD and Almyra Deforest, PA-C   Other Instructions

## 2018-10-28 ENCOUNTER — Encounter: Payer: Self-pay | Admitting: Physician Assistant

## 2018-11-10 ENCOUNTER — Ambulatory Visit: Admitting: Physician Assistant

## 2018-11-12 ENCOUNTER — Ambulatory Visit (INDEPENDENT_AMBULATORY_CARE_PROVIDER_SITE_OTHER): Admitting: Cardiology

## 2018-11-12 ENCOUNTER — Other Ambulatory Visit: Payer: Self-pay

## 2018-11-12 ENCOUNTER — Encounter: Payer: Self-pay | Admitting: Cardiology

## 2018-11-12 ENCOUNTER — Ambulatory Visit: Admitting: Medical

## 2018-11-12 DIAGNOSIS — E785 Hyperlipidemia, unspecified: Secondary | ICD-10-CM | POA: Diagnosis not present

## 2018-11-12 DIAGNOSIS — I214 Non-ST elevation (NSTEMI) myocardial infarction: Secondary | ICD-10-CM

## 2018-11-12 DIAGNOSIS — Z9861 Coronary angioplasty status: Secondary | ICD-10-CM

## 2018-11-12 DIAGNOSIS — I10 Essential (primary) hypertension: Secondary | ICD-10-CM

## 2018-11-12 DIAGNOSIS — I2510 Atherosclerotic heart disease of native coronary artery without angina pectoris: Secondary | ICD-10-CM

## 2018-11-12 NOTE — Patient Instructions (Signed)
Medication Instructions:  Your physician recommends that you continue on your current medications as directed. Please refer to the Current Medication list given to you today. *If you need a refill on your cardiac medications before your next appointment, please call your pharmacy*  Lab Work: None  If you have labs (blood work) drawn today and your tests are completely normal, you will receive your results only by: Marland Kitchen MyChart Message (if you have MyChart) OR . A paper copy in the mail If you have any lab test that is abnormal or we need to change your treatment, we will call you to review the results.  Testing/Procedures: None   Follow-Up: At [facility], you and your health needs are our priority.  As part of our continuing mission to provide you with exceptional heart care, we have created designated Provider Care Teams.  These Care Teams include your primary Cardiologist (physician) and Advanced Practice Providers (APPs -  Physician Assistants and Nurse Practitioners) who all work together to provide you with the care you need, when you need it.  Your next appointment:   FOLLOW UP AS SCHEDULED   The format for your next appointment:   In Person  Provider:   K. Mali Hilty, MD  Other Instructions Erasmo Downer our office pharmacist will be in touch with you tomorrow or Monday at the latest.

## 2018-11-12 NOTE — Assessment & Plan Note (Signed)
Statin intolerant- refer to our pharmacist for consideration of PCSK9

## 2018-11-12 NOTE — Assessment & Plan Note (Signed)
Repeat B/P by me 142/82- she will continue to monitor her home B/P

## 2018-11-12 NOTE — Assessment & Plan Note (Signed)
Recent NSTEMI Sept 2020-Rx's with LAD PCI

## 2018-11-12 NOTE — Assessment & Plan Note (Signed)
mLAD PCI with DES 10/02/2018

## 2018-11-12 NOTE — Progress Notes (Signed)
Cardiology Office Note:    Date:  11/12/2018   ID:  Sherri [name], DOB October 14, 1955, MRN [id number]  PCP:  Harrison Mons, [place]  Cardiologist:  Pixie Casino, MD  Electrophysiologist:  None   Referring MD: [facility] Mons, PA   No chief complaint on file. F/u from [place] 10/26/2018  History of Present Illness:    Sherri [name] is a 63 y.o. female with a hx of   breast CA, HTN, migraines and chronic pain managed w/ chronic opioid therapywho was seen 10/03/2018 for the evaluation ofCPat the request of [facility]. Pt has no prior h/o CAD, MI, HF or any other significant cardiac hx, although she does have +FHx early CAD. She ruled in for a NSTEMI.  Cath done 10/02/2018 showed high grade mLAD which was treated with PCI and DES.  Echo showed her LVEF to be 65-70% LV gram mentioned some apical hypokniesis but not seen on echo.  She has a history of statin intolerance- severe joint pain with atorvastatin and intolerance to other statins.  She was discharged on Pravastatin but had N&V and stopped this.   She was seen by Octaviano Batty 10/26/2018.  The patient was intolerant to Lisinopril and he increased her Metoprolol.  Since that visit she says she has done well.  She has obtained a home cuff and says her B/P at home is running 120 range.    Past Medical History:  Diagnosis Date  . Anxiety   . Arthritis   . Breast pain, right   . Cancer [facility])    ? breast, cervical   . Closed left ankle fracture 12/17/2015  . Depression    situational  . Hypertension    not taken atenolol or lisinopril in >67mos, no money  . Migraines   . Pneumonia   . Ruptured disk     Past Surgical History:  Procedure Laterality Date  . AUGMENTATION MAMMAPLASTY Right    1979  . BREAST SURGERY     lumpectomy in 20s for benign mass with implant placement (subglandular)  . CARPAL TUNNEL RELEASE     right wrist  . CARPAL TUNNEL RELEASE Right 09/02/2014   Procedure: RIGHT CARPAL TUNNEL RELEASE;  Surgeon: Melrose Nakayama,  MD;  Location: [place];  Service: Orthopedics;  Laterality: Right;  . CERVICAL CONE BIOPSY    . COLONOSCOPY    . CORONARY STENT INTERVENTION N/A 10/02/2018   Procedure: CORONARY STENT INTERVENTION;  Surgeon: Nelva Bush, MD;  Location: [place] [facility];  Service: Cardiovascular;  Laterality: N/A;  . KNEE ARTHROSCOPY Left 07/1996  . KNEE ARTHROSCOPY Right 1998  . LEFT HEART CATH AND CORONARY ANGIOGRAPHY N/A 10/02/2018   Procedure: LEFT HEART CATH AND CORONARY ANGIOGRAPHY;  Surgeon: Nelva Bush, MD;  Location: [place] [facility];  Service: Cardiovascular;  Laterality: N/A;  . ORIF ANKLE FRACTURE Left 12/17/2015   Procedure: OPEN REDUCTION INTERNAL FIXATION (ORIF) ANKLE FRACTURE;  Surgeon: Milly Jakob, MD;  [facility];  Service: Orthopedics;  Laterality: Left;  . SPINE SURGERY  72011   L2-L5 stabilization  . TOTAL HIP ARTHROPLASTY Right 10/15/2016  . TOTAL HIP ARTHROPLASTY Right 10/15/2016   Procedure: TOTAL HIP ARTHROPLASTY ANTERIOR APPROACH;  Surgeon: Melrose Nakayama, MD;  Location: [place];  Service: Orthopedics;  Laterality: Right;    Current Medications: Current Meds  Medication Sig  . amLODipine (NORVASC) 10 MG tablet Take 1 tablet (10 mg total) by mouth daily.  Marland Kitchen buPROPion (WELLBUTRIN XL) 150 MG 24 hr  tablet Take 1 tablet (150 mg total) by mouth daily.  . Butalbital-APAP-Caffeine 50-325-40 MG capsule Take 1 capsule by mouth every 6 (six) hours as needed for headache.   . docusate sodium (COLACE) 100 MG capsule Take 1 capsule (100 mg total) by mouth 2 (two) times daily.  Eduard Roux (AIMOVIG Walker) Inject into the skin every 30 (thirty) days. Unknown dose Given at Neurologist  . metaxalone (SKELAXIN) 800 MG tablet Take 1 tablet (800 mg total) by mouth 3 (three) times daily as needed for pain.  . metoprolol tartrate (LOPRESSOR) 50 MG tablet Take 1 tablet (50 mg total) by mouth 2 (two) times daily.  . nitroGLYCERIN (NITROSTAT) 0.4 MG SL tablet Place 1  tablet (0.4 mg total) under the tongue every 5 (five) minutes as needed for chest pain.  Marland Kitchen oxyCODONE-acetaminophen (PERCOCET) 10-325 MG tablet Take 1-2 tablets by mouth every 4 (four) hours as needed for pain.  . ticagrelor (BRILINTA) 90 MG TABS tablet Take 1 tablet (90 mg total) by mouth 2 (two) times daily.  . valACYclovir (VALTREX) 500 MG tablet Take 1 PO QD for suppression; INCREASE to BID x 3 days PRN outbreak (Patient taking differently: Take 500 mg by mouth daily. )     Allergies:   Effexor [venlafaxine hydrochloride], Iodine, Shellfish allergy, Codeine, Flexeril [cyclobenzaprine hcl], Hydromorphone, Nubain [nalbuphine hcl], Atorvastatin, and Erythromycin   Social History   Socioeconomic History  . Marital status: Widowed    Spouse name: Patrick Jupiter  . Number of children: 0  . Years of education: 23  . Highest education level: Some college, no degree  Occupational History    Employer: [employer]   Social Needs  . Financial resource strain: Somewhat hard  . Food insecurity    Worry: Sometimes true    Inability: Sometimes true  . Transportation needs    Medical: Yes    Non-medical: Yes  Tobacco Use  . Smoking status: Former Smoker    Years: 1.00    Types: Cigarettes    Quit date: 01/08/1976    Years since quitting: 42.8  . Smokeless tobacco: Never Used  Substance and Sexual Activity  . Alcohol use: No    Alcohol/week: 0.0 standard drinks  . Drug use: No  . Sexual activity: Not on file  Lifestyle  . Physical activity    Days per week: 0 days    Minutes per session: 0 min  . Stress: Rather much  Relationships  . Social connections    Talks on phone: More than three times a week    Gets together: Never    Attends religious service: Never    Active member of club or organization: Yes    Attends meetings of clubs or organizations: More than 4 times per year    Relationship status: Widowed  Other Topics Concern  . Not on file  Social History Narrative   Husband died  2011/10/27 of end-stage COPD.  Adversarial relationship with his ex-wife who lives in a house he was paying for.  Attorneys for both parties are involved in attempt to settle the conflict and financial matters of his will.         Update 09/30/2018   Right handed   Caffeine: 1 cup of coffee/day   Lives alone     Family History: The patient's family history includes Breast cancer (age of onset: 54) in her mother; Cancer in her mother and sister; Diabetes in her mother; Glaucoma in her mother; Gout in her father; Heart disease  in her mother; Migraines in her brother, maternal grandmother, mother, and sister.  ROS:   Please see the history of present illness.     All other systems reviewed and are negative.  EKGs/Labs/Other Studies Reviewed:    The following studies were reviewed today: Cath/ PCI 10/02/2018  Recent Labs: 10/03/2018: BUN 9; Creatinine, Ser 0.68; Hemoglobin 11.2; Magnesium 2.1; Platelets 222; Potassium 3.4; Sodium 138  Recent Lipid Panel    Component Value Date/Time   CHOL 215 (H) 10/02/2018 0521   CHOL 195 05/06/2017 1343   TRIG 35 10/02/2018 0521   HDL 72 10/02/2018 0521   HDL 71 05/06/2017 1343   CHOLHDL 3.0 10/02/2018 0521   VLDL 7 10/02/2018 0521   LDLCALC 136 (H) 10/02/2018 0521   LDLCALC 101 (H) 05/06/2017 1343    Physical Exam:    VS:  BP 138/76   Pulse 86   Temp (!) 97.2 F (36.2 C)   Ht 5\' 8"  (1.727 m)   Wt 217 lb 1.9 oz (98.5 kg)   SpO2 97%   BMI 33.01 kg/m     Wt Readings from Last 3 Encounters:  11/12/18 217 lb 1.9 oz (98.5 kg)  10/26/18 211 lb (95.7 kg)  10/03/18 210 lb 4.8 oz (95.4 kg)     GEN: Well nourished, well developed in no acute distress HEENT: Normal NECK: No JVD; No carotid bruits LYMPHATICS: No lymphadenopathy CARDIAC: RRR, no murmurs, rubs, gallops RESPIRATORY:  Clear to auscultation without rales, wheezing or rhonchi  ABDOMEN: Soft, non-tender, non-distended MUSCULOSKELETAL:  No edema; No deformity  SKIN: Warm and  dry NEUROLOGIC:  Alert and oriented x 3 PSYCHIATRIC:  Normal affect   ASSESSMENT:    NSTEMI (non-ST elevated myocardial infarction) [facility]) Recent NSTEMI Sept 2020-Rx's with LAD PCI  CAD S/P percutaneous coronary angioplasty mLAD PCI with DES 10/02/2018  Dyslipidemia, goal LDL below 70 Statin intolerant- refer to our pharmacist for consideration of PCSK9  Essential (primary) hypertension Repeat B/P by me 142/82- she will continue to monitor her home B/P  PLAN:    Same Rx- refer to pharmacy for consideration of PCSK9.  Keep f/u with Dr Debara Pickett as scheduled.    Medication Adjustments/Labs and Tests Ordered: Current medicines are reviewed at length with the patient today.  Concerns regarding medicines are outlined above.  No orders of the defined types were placed in this encounter.  No orders of the defined types were placed in this encounter.   Patient Instructions  Medication Instructions:  Your physician recommends that you continue on your current medications as directed. Please refer to the Current Medication list given to you today. *If you need a refill on your cardiac medications before your next appointment, please call your pharmacy*  Lab Work: None  If you have labs (blood work) drawn today and your tests are completely normal, you will receive your results only by: Marland Kitchen MyChart Message (if you have MyChart) OR . A paper copy in the mail If you have any lab test that is abnormal or we need to change your treatment, we will call you to review the results.  Testing/Procedures: None   Follow-Up: At [facility], you and your health needs are our priority.  As part of our continuing mission to provide you with exceptional heart care, we have created designated Provider Care Teams.  These Care Teams include your primary Cardiologist (physician) and Advanced Practice Providers (APPs -  Physician Assistants and Nurse Practitioners) who all work together to provide you with  the care you need, when you need it.  Your next appointment:   FOLLOW UP AS SCHEDULED   The format for your next appointment:   In Person  Provider:   K. Mali Hilty, MD  Other Instructions Erasmo Downer our office pharmacist will be in touch with you tomorrow or Monday at the latest.     Signed, Kerin Ransom, PA-C  11/12/2018 3:24 PM    [place]

## 2018-11-30 ENCOUNTER — Telehealth: Payer: Self-pay | Admitting: Pharmacist Clinician (PhC)/ Clinical Pharmacy Specialist

## 2018-11-30 NOTE — Telephone Encounter (Signed)
LMOM to return call regarding start of PCSK-9 inhibitor

## 2018-11-30 NOTE — Telephone Encounter (Signed)
-----   Message from Harold Hedge, [place] sent at 11/12/2018  3:16 PM EST ----- Regarding: possible candidate for PCSK9 Geraldo Docker,  This patient saw Lurena Joiner and  he wanted you to follow up with patient in regards to a pcsk-9   Thanks,  Tee

## 2018-12-14 ENCOUNTER — Other Ambulatory Visit: Payer: Self-pay | Admitting: Nurse Practitioner

## 2018-12-14 DIAGNOSIS — M4306 Spondylolysis, lumbar region: Secondary | ICD-10-CM

## 2018-12-22 ENCOUNTER — Ambulatory Visit: Admitting: Internal Medicine

## 2019-01-02 ENCOUNTER — Other Ambulatory Visit: Payer: Self-pay

## 2019-01-02 ENCOUNTER — Ambulatory Visit
Admission: RE | Admit: 2019-01-02 | Discharge: 2019-01-02 | Disposition: A | Discharge status: Home / Self Care | Source: Ambulatory Visit | Attending: Nurse Practitioner | Admitting: Nurse Practitioner

## 2019-01-02 DIAGNOSIS — M4306 Spondylolysis, lumbar region: Secondary | ICD-10-CM

## 2019-01-02 MED ORDER — GADOBENATE DIMEGLUMINE 529 MG/ML IV SOLN
20.0000 mL | Freq: Once | INTRAVENOUS | Status: AC | PRN
  Administered 2019-01-02: 20 mL via INTRAVENOUS

## 2019-03-05 ENCOUNTER — Other Ambulatory Visit: Payer: Self-pay | Admitting: Physician Assistant

## 2019-03-11 ENCOUNTER — Other Ambulatory Visit: Payer: Self-pay

## 2019-03-11 ENCOUNTER — Encounter: Payer: Self-pay | Admitting: Internal Medicine

## 2019-03-11 ENCOUNTER — Ambulatory Visit (INDEPENDENT_AMBULATORY_CARE_PROVIDER_SITE_OTHER): Admitting: Internal Medicine

## 2019-03-11 VITALS — BP 148/80 | HR 69 | Temp 95.9°F | Ht 68.0 in | Wt 223.6 lb

## 2019-03-11 DIAGNOSIS — I2510 Atherosclerotic heart disease of native coronary artery without angina pectoris: Secondary | ICD-10-CM | POA: Diagnosis not present

## 2019-03-11 DIAGNOSIS — I25119 Atherosclerotic heart disease of native coronary artery with unspecified angina pectoris: Secondary | ICD-10-CM

## 2019-03-11 DIAGNOSIS — T466X5A Adverse effect of antihyperlipidemic and antiarteriosclerotic drugs, initial encounter: Secondary | ICD-10-CM

## 2019-03-11 DIAGNOSIS — E785 Hyperlipidemia, unspecified: Secondary | ICD-10-CM

## 2019-03-11 DIAGNOSIS — Z9861 Coronary angioplasty status: Secondary | ICD-10-CM

## 2019-03-11 DIAGNOSIS — I10 Essential (primary) hypertension: Secondary | ICD-10-CM

## 2019-03-11 DIAGNOSIS — M791 Myalgia, unspecified site: Secondary | ICD-10-CM | POA: Diagnosis not present

## 2019-03-11 NOTE — Progress Notes (Signed)
OFFICE NOTE  Chief Complaint:  Follow-up  Primary Care Physician: [facility] Mons, PA  HPI:  Sherri [name] is a 64 y.o. female with family history of premature CAD in mother's side, but few other cardiac risk factors - does have migraines. Has had 2 days of left/center chest pain, radiating to jaw and left arm - this was associated with headache and she was seeing her neurologist. BP is also noted to be elevated - apparently she had a diagnosis of hypertension, but has not been on medication. She does take chronic pain medication. Mild EKG changes noted - troponin minimally elevated but flat -could represent NSTEMI, but not typical for ACS. Suspect there could be CAD, but may also be coronary vasospasm given history of migraine. She underwent LHC and was found to have a 99% mid-LAD stenosis, s/p PCI with 3.5 x 12 mm and 3.0 x 18 mm DES overlapping. She tolerated this well. Has followed up with our APP's. BP was uncontrolled, however, her amlodipine was increased and it has improved.  Lipids remain above goal.  She initially was on atorvastatin but had significant joint pain with that.  It was discontinued and she was switched to pravastatin.  She says she continues to have issues with that.  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  She also had one episode where she was under significant stress and felt fluttering in her heart and discomfort she took a nitroglycerin and felt some improvement after 30 minutes.  PMHx:  Past Medical History:  Diagnosis Date  . Anxiety   . Arthritis   . Breast pain, right   . Cancer [facility])    ? breast, cervical   . Closed left ankle fracture 12/17/2015  . Depression    situational  . Hypertension    not taken atenolol or lisinopril in >37mos, no money  . Migraines   . Pneumonia   . Ruptured disk     Past Surgical History:  Procedure Laterality Date  . AUGMENTATION MAMMAPLASTY Right    1979  . BREAST SURGERY     lumpectomy in 20s  for benign mass with implant placement (subglandular)  . CARPAL TUNNEL RELEASE     right wrist  . CARPAL TUNNEL RELEASE Right 09/02/2014   Procedure: RIGHT CARPAL TUNNEL RELEASE;  Surgeon: Melrose Nakayama, MD;  Location: [place];  Service: Orthopedics;  Laterality: Right;  . CERVICAL CONE BIOPSY    . COLONOSCOPY    . CORONARY STENT INTERVENTION N/A 10/02/2018   Procedure: CORONARY STENT INTERVENTION;  Surgeon: Nelva Bush, MD;  Location: [place] [facility];  Service: Cardiovascular;  Laterality: N/A;  . KNEE ARTHROSCOPY Left 07/1996  . KNEE ARTHROSCOPY Right 1998  . LEFT HEART CATH AND CORONARY ANGIOGRAPHY N/A 10/02/2018   Procedure: LEFT HEART CATH AND CORONARY ANGIOGRAPHY;  Surgeon: Nelva Bush, MD;  Location: [place] [facility];  Service: Cardiovascular;  Laterality: N/A;  . ORIF ANKLE FRACTURE Left 12/17/2015   Procedure: OPEN REDUCTION INTERNAL FIXATION (ORIF) ANKLE FRACTURE;  Surgeon: Milly Jakob, MD;  [facility];  Service: Orthopedics;  Laterality: Left;  . SPINE SURGERY  72011   L2-L5 stabilization  . TOTAL HIP ARTHROPLASTY Right 10/15/2016  . TOTAL HIP ARTHROPLASTY Right 10/15/2016   Procedure: TOTAL HIP ARTHROPLASTY ANTERIOR APPROACH;  Surgeon: Melrose Nakayama, MD;  Location: [place];  Service: Orthopedics;  Laterality: Right;    FAMHx:  Family History  Problem Relation Age of Onset  .  Diabetes Mother   . Glaucoma Mother   . Cancer Mother        breast, ovarian  . Heart disease Mother   . Breast cancer Mother 34  . Migraines Mother   . Gout Father   . Cancer Sister        ovarian, uterine cancer  . Migraines Sister   . Migraines Brother   . Migraines Maternal Grandmother     SOCHx:   reports that she quit smoking about 43 years ago. Her smoking use included cigarettes. She quit after 1.00 year of use. She has never used smokeless tobacco. She reports that she does not drink alcohol or use drugs.  ALLERGIES:  Allergies  Allergen  Reactions  . Effexor [Venlafaxine Hydrochloride] Shortness Of Breath, Rash and Other (See Comments)    HALLUCINATIONS  . Iodine Anaphylaxis  . Shellfish Allergy Anaphylaxis and Other (See Comments)  . Codeine Swelling and Rash    SWELLING REACTION UNSPECIFIED   . Flexeril [Cyclobenzaprine Hcl] Other (See Comments)    HALLUCINATIONS AND "OUT OF CONTROL"  . Hydromorphone Other (See Comments)    HALLUCINATIONS  . Nubain [Nalbuphine Hcl] Other (See Comments)    HALLUCINATIONS  . Atorvastatin Other (See Comments)    Severe joint pain  . Erythromycin Rash    ROS: Pertinent items noted in HPI and remainder of comprehensive ROS otherwise negative.  HOME MEDS: Current Outpatient Medications on File Prior to Visit  Medication Sig Dispense Refill  . amLODipine (NORVASC) 10 MG tablet TAKE ONE TABLET BY MOUTH ONE TIME DAILY  30 tablet 7  . BRILINTA 90 MG TABS tablet TAKE ONE TABLET BY MOUTH TWICE DAILY  60 tablet 7  . buPROPion (WELLBUTRIN XL) 150 MG 24 hr tablet Take 1 tablet (150 mg total) by mouth daily. 90 tablet 3  . busPIRone (BUSPAR) 15 MG tablet TAKE ONE TABLET BY MOUTH TWICE DAILY    . Butalbital-APAP-Caffeine 50-325-40 MG capsule Take 1 capsule by mouth every 6 (six) hours as needed for headache.     . docusate sodium (COLACE) 100 MG capsule Take 1 capsule (100 mg total) by mouth 2 (two) times daily. 30 capsule 0  . Erenumab-aooe (AIMOVIG Hand) Inject into the skin every 30 (thirty) days. Unknown dose Given at Neurologist    . metaxalone (SKELAXIN) 800 MG tablet Take 1 tablet (800 mg total) by mouth 3 (three) times daily as needed for pain. 90 tablet 3  . metoprolol tartrate (LOPRESSOR) 50 MG tablet TAKE ONE TABLET BY MOUTH TWICE DAILY  60 tablet 7  . nitroGLYCERIN (NITROSTAT) 0.4 MG SL tablet Place 1 tablet (0.4 mg total) under the tongue every 5 (five) minutes as needed for chest pain. 15 tablet 0  . oxyCODONE-acetaminophen (PERCOCET) 10-325 MG tablet Take 1-2 tablets by mouth every 4  (four) hours as needed for pain. 40 tablet 0  . valACYclovir (VALTREX) 500 MG tablet Take 1 PO QD for suppression; INCREASE to BID x 3 days PRN outbreak (Patient taking differently: Take 500 mg by mouth daily. ) 100 tablet 3   No current facility-administered medications on file prior to visit.    LABS/IMAGING: No results found for this or any previous visit (from the past 48 hour(s)). No results found.  LIPID PANEL:    Component Value Date/Time   CHOL 215 (H) 10/02/2018 0521   CHOL 195 05/06/2017 1343   TRIG 35 10/02/2018 0521   HDL 72 10/02/2018 0521   HDL 71 05/06/2017 1343  CHOLHDL 3.0 10/02/2018 0521   VLDL 7 10/02/2018 0521   LDLCALC 136 (H) 10/02/2018 0521   LDLCALC 101 (H) 05/06/2017 1343     WEIGHTS: Wt Readings from Last 3 Encounters:  03/11/19 223 lb 9.6 oz (101.4 kg)  11/12/18 217 lb 1.9 oz (98.5 kg)  10/26/18 211 lb (95.7 kg)    VITALS: BP (!) 148/80   Pulse 69   Temp (!) 95.9 F (35.5 C)   Ht 5\' 8"  (1.727 m)   Wt 223 lb 9.6 oz (101.4 kg)   SpO2 97%   BMI 34.00 kg/m   EXAM: General appearance: alert and no distress Neck: no carotid bruit, no JVD and thyroid not enlarged, symmetric, no tenderness/mass/nodules Lungs: clear to auscultation bilaterally Heart: regular rate and rhythm Abdomen: soft, non-tender; bowel sounds normal; no masses,  no organomegaly Extremities: extremities normal, atraumatic, no cyanosis or edema Pulses: 2+ and symmetric Skin: Skin color, texture, turgor normal. No rashes or lesions Neurologic: Grossly normal Psych: Pleasant  EKG: Deferred  ASSESSMENT: 1. CAD with 99% mid LAD stenosis status post DES placement (10/02/2018) for NSTEMI 2. Hypertension 3. Dyslipidemia - statin intolerant, myalgias/arthralgias 4. Strong family history of premature coronary disease 5. Migraine headaches 6. Anxiety  PLAN: 1.   Ms. Stacer is doing well without any significant recurrent coronary disease.  She had one episode of chest  discomfort associated with palpitations that was mostly likely driven by stress and anxiety at work.  It sounds like she has a lot of stress there but needs her job.  She has some chronic back pain and was inquiring about back injections however due to being on Brilinta, this cannot happen electively at least until 6 months, however ideally more than 1 year out as I would not like to interrupt dual antiplatelet therapy.  Blood pressure is improved with recent increase in amlodipine.  It appears that she is statin intolerant causing arthralgias.  She has tried both atorvastatin and pravastatin.  At this point I think she is a good candidate for PCSK9 inhibitor.  We will repeat pursue that and repeat lipids in about 3 months.  Plan follow-up with me afterwards.  Pixie Casino, MD, [facility], [place] Director of the Advanced Lipid Disorders &  Cardiovascular Risk Reduction Clinic Diplomate of the American Board of Clinical Lipidology Attending Cardiologist  Direct Dial: [phone]  Fax: [phone]  Website:  [email or web address] 03/11/2019, 8:36 AM

## 2019-03-11 NOTE — Patient Instructions (Signed)
Medication Instructions:  Dr. Debara Pickett recommends Repatha 140mg /mL or Praluent 150mg /mL (PCSK9). This is an injectable cholesterol medication self-administered once every 14 days in an area of fatty tissue such as abdomen or outer thigh. This medication will need prior approval with your insurance company, which we will work on. If the medication is not approved initially, we may need to do an appeal with your insurance. We will keep you updated on this process. You will need a red sharps container for proper disposal and your pharmacy can direct you on this.   If you need co-pay assistance, please look into the program at healthwellfoundation.org >> disease funds >> hypercholesterolemia. This is an online application or you can call to complete.   If you need a co-pay card for Repatha: [email or web address]-moyer.[email or web address] >> paying for Repatha If you need a co-pay card for Praluent: WedMap.it >> starting & paying for Praluent  *If you need a refill on your cardiac medications before your next appointment, please call your pharmacy*   Lab Work: FASTING lab work in 3-4 months to check cholesterol This is to be completed about 1 week before your next lipid clinic appointment   If you have labs (blood work) drawn today and your tests are completely normal, you will receive your results only by: Marland Kitchen MyChart Message (if you have MyChart) OR . A paper copy in the mail If you have any lab test that is abnormal or we need to change your treatment, we will call you to review the results.   Testing/Procedures: NONE   Follow-Up: At [facility], you and your health needs are our priority.  As part of our continuing mission to provide you with exceptional heart care, we have created designated Provider Care Teams.  These Care Teams include your primary Cardiologist (physician) and Advanced Practice Providers (APPs -  Physician Assistants and Nurse Practitioners) who all work together to provide you with the care you need, when  you need it.  We recommend signing up for the patient portal called "MyChart".  Sign up information is provided on this After Visit Summary.  MyChart is used to connect with patients for Virtual Visits (Telemedicine).  Patients are able to view lab/test results, encounter notes, upcoming appointments, etc.  Non-urgent messages can be sent to your provider as well.   To learn more about what you can do with MyChart, go to [email or web address].    Your next appointment:   3-4 month(s) - lipid clinic  The format for your next appointment:   Either In Person or Virtual  Provider:   K. Mali Hilty, MD   Other Instructions

## 2019-03-15 ENCOUNTER — Encounter: Payer: Self-pay | Admitting: *Deleted

## 2019-03-16 ENCOUNTER — Telehealth: Payer: Self-pay | Admitting: Internal Medicine

## 2019-03-16 NOTE — Telephone Encounter (Signed)
Faxed letter of medical necessity & MD note to fax # listed below on 03/15/2019 ________________________  From: Fidel Levy, RN  Sent: 03/12/2019  1:34 PM EST  To: Fidel Levy, RN  Subject: RE: PCSK9 prior [place] Organ: [facility]  FaxZN:[id number]   Letter of medical necessity, treatment plan explanation  ----- Message -----  From: Fidel Levy, RN  Sent: 03/11/2019  8:52 AM EST  To: Fidel Levy, RN  Subject: PCSK9 prior Josem Kaufmann                 ID - social security #  BIN [phone]  PCN - [id number]  Phone: [phone]   Patient cell: [phone]

## 2019-03-26 MED ORDER — REPATHA SURECLICK 140 MG/ML ~~LOC~~ SOAJ: 1.0000 | SUBCUTANEOUS | 11 refills | Status: DC

## 2019-03-26 NOTE — Addendum Note (Signed)
Addended by: Fidel Levy on: 03/26/2019 01:19 PM   Modules accepted: Orders

## 2019-03-26 NOTE — Telephone Encounter (Signed)
ID: [id number]  Phone: [phone]   [facility] said no pre-auth is needed for medication  Phone: [phone] - [employer] pharmacy benefits Confirmed no PA is required - copay is $60.56/month

## 2019-03-26 NOTE — Telephone Encounter (Signed)
Left message for patient to call back - has she heard back from insurance on medication approval? Explained I am unable to check on status of medication as I do not have her complete SS#, which is required per insurance representative

## 2019-03-26 NOTE — Telephone Encounter (Signed)
Patient notified. Rx sent to [facility]

## 2019-06-15 LAB — LIPID PANEL
Chol/HDL Ratio: 2.7 ratio (ref 0.0–4.4)
Cholesterol, Total: 193 mg/dL (ref 100–199)
HDL: 72 mg/dL (ref >39)
LDL Chol Calc (NIH): 109 mg/dL — ABNORMAL HIGH (ref 0–99)
Triglycerides: 64 mg/dL (ref 0–149)
VLDL Cholesterol Cal: 12 mg/dL (ref 5–40)

## 2019-06-16 ENCOUNTER — Encounter: Payer: Self-pay | Admitting: Internal Medicine

## 2019-06-16 ENCOUNTER — Other Ambulatory Visit: Payer: Self-pay

## 2019-06-16 ENCOUNTER — Ambulatory Visit (INDEPENDENT_AMBULATORY_CARE_PROVIDER_SITE_OTHER): Admitting: Internal Medicine

## 2019-06-16 VITALS — BP 158/78 | HR 65 | Ht 68.0 in | Wt 229.4 lb

## 2019-06-16 DIAGNOSIS — E785 Hyperlipidemia, unspecified: Secondary | ICD-10-CM | POA: Diagnosis not present

## 2019-06-16 DIAGNOSIS — Z9861 Coronary angioplasty status: Secondary | ICD-10-CM

## 2019-06-16 DIAGNOSIS — I25119 Atherosclerotic heart disease of native coronary artery with unspecified angina pectoris: Secondary | ICD-10-CM | POA: Diagnosis not present

## 2019-06-16 DIAGNOSIS — I10 Essential (primary) hypertension: Secondary | ICD-10-CM

## 2019-06-16 DIAGNOSIS — I2510 Atherosclerotic heart disease of native coronary artery without angina pectoris: Secondary | ICD-10-CM | POA: Diagnosis not present

## 2019-06-16 DIAGNOSIS — M791 Myalgia, unspecified site: Secondary | ICD-10-CM

## 2019-06-16 DIAGNOSIS — T466X5A Adverse effect of antihyperlipidemic and antiarteriosclerotic drugs, initial encounter: Secondary | ICD-10-CM

## 2019-06-16 MED ORDER — EZETIMIBE 10 MG PO TABS: 10.0000 mg | ORAL_TABLET | Freq: Every day | ORAL | 3 refills | Status: DC

## 2019-06-16 NOTE — Patient Instructions (Signed)
Medication Instructions:  Your physician has recommended you make the following change in your medication: START zetia 10mg  daily Continue all other current medications  *If you need a refill on your cardiac medications before your next appointment, please call your pharmacy*   Lab Work: FASTING lipid panel in 3 months If you have labs (blood work) drawn today and your tests are completely normal, you will receive your results only by: Marland Kitchen MyChart Message (if you have MyChart) OR . A paper copy in the mail If you have any lab test that is abnormal or we need to change your treatment, we will call you to review the results.   Testing/Procedures: NONE   Follow-Up: At [facility], you and your health needs are our priority.  As part of our continuing mission to provide you with exceptional heart care, we have created designated Provider Care Teams.  These Care Teams include your primary Cardiologist (physician) and Advanced Practice Providers (APPs -  Physician Assistants and Nurse Practitioners) who all work together to provide you with the care you need, when you need it.  We recommend signing up for the patient portal called "MyChart".  Sign up information is provided on this After Visit Summary.  MyChart is used to connect with patients for Virtual Visits (Telemedicine).  Patients are able to view lab/test results, encounter notes, upcoming appointments, etc.  Non-urgent messages can be sent to your provider as well.   To learn more about what you can do with MyChart, go to [email or web address].    Your next appointment:   6 month(s)  The format for your next appointment:   In Person  Provider:   You may see Pixie Casino, MD or one of the following Advanced Practice Providers on your designated Care Team:    Almyra Deforest, PA-C  Fabian Sharp, PA-C or   Roby Lofts, [place]    Other Instructions

## 2019-06-16 NOTE — Progress Notes (Signed)
OFFICE NOTE  Chief Complaint:  Follow-up  Primary Care Physician: Harrison Mons, PA  HPI:  Sherri [name] is a 64 y.o. female with family history of premature CAD in mother's side, but few other cardiac risk factors - does have migraines. Has had 2 days of left/center chest pain, radiating to jaw and left arm - this was associated with headache and she was seeing her neurologist. BP is also noted to be elevated - apparently she had a diagnosis of hypertension, but has not been on medication. She does take chronic pain medication. Mild EKG changes noted - troponin minimally elevated but flat -could represent NSTEMI, but not typical for ACS. Suspect there could be CAD, but may also be coronary vasospasm given history of migraine. She underwent LHC and was found to have a 99% mid-LAD stenosis, s/p PCI with 3.5 x 12 mm and 3.0 x 18 mm DES overlapping. She tolerated this well. Has followed up with our APP's. BP was uncontrolled, however, her amlodipine was increased and it has improved.  Lipids remain above goal.  She initially was on atorvastatin but had significant joint pain with that.  It was discontinued and she was switched to pravastatin.  She says she continues to have issues with that.  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  She also had one episode where she was under significant stress and felt fluttering in her heart and discomfort she took a nitroglycerin and felt some improvement after 30 minutes.  06/16/2019  Sherri [name] returns today for follow-up.  Initially blood pressure was elevated, however came down to 138/78.  She does report some intermittent palpitations however EKG today shows sinus rhythm at 65.  She did have repeat lipids yesterday which showed total cholesterol 193, triglycerides 64, HDL 72 and LDL of 109.  Given coronary disease her target LDL is less than 70 and interestingly her HDL is 72 apparently is not cardioprotective given her known coronary disease.   Unfortunately she could not tolerate statins.  PMHx:  Past Medical History:  Diagnosis Date  . Anxiety   . Arthritis   . Breast pain, right   . Cancer [facility])    ? breast, cervical   . Closed left ankle fracture 12/17/2015  . Depression    situational  . Hypertension    not taken atenolol or lisinopril in >35mos, no money  . Migraines   . Pneumonia   . Ruptured disk     Past Surgical History:  Procedure Laterality Date  . AUGMENTATION MAMMAPLASTY Right    1979  . BREAST SURGERY     lumpectomy in 20s for benign mass with implant placement (subglandular)  . CARPAL TUNNEL RELEASE     right wrist  . CARPAL TUNNEL RELEASE Right 09/02/2014   Procedure: RIGHT CARPAL TUNNEL RELEASE;  Surgeon: Melrose Nakayama, MD;  Location: [place];  Service: Orthopedics;  Laterality: Right;  . CERVICAL CONE BIOPSY    . COLONOSCOPY    . CORONARY STENT INTERVENTION N/A 10/02/2018   Procedure: CORONARY STENT INTERVENTION;  Surgeon: Nelva Bush, MD;  Location: [place] CV LAB;  Service: Cardiovascular;  Laterality: N/A;  . KNEE ARTHROSCOPY Left 07/1996  . KNEE ARTHROSCOPY Right 1998  . LEFT HEART CATH AND CORONARY ANGIOGRAPHY N/A 10/02/2018   Procedure: LEFT HEART CATH AND CORONARY ANGIOGRAPHY;  Surgeon: Nelva Bush, MD;  Location: [place] CV [facility];  Service: Cardiovascular;  Laterality: N/A;  . ORIF ANKLE FRACTURE  Left 12/17/2015   Procedure: OPEN REDUCTION INTERNAL FIXATION (ORIF) ANKLE FRACTURE;  Surgeon: Milly Jakob, MD;  [facility];  Service: Orthopedics;  Laterality: Left;  . SPINE SURGERY  72011   L2-L5 stabilization  . TOTAL HIP ARTHROPLASTY Right 10/15/2016  . TOTAL HIP ARTHROPLASTY Right 10/15/2016   Procedure: TOTAL HIP ARTHROPLASTY ANTERIOR APPROACH;  Surgeon: Melrose Nakayama, MD;  Location: [place];  Service: Orthopedics;  Laterality: Right;    FAMHx:  Family History  Problem Relation Age of Onset  . Diabetes Mother   . Glaucoma Mother   . Cancer  Mother        breast, ovarian  . Heart disease Mother   . Breast cancer Mother 4  . Migraines Mother   . Gout Father   . Cancer Sister        ovarian, uterine cancer  . Migraines Sister   . Migraines Brother   . Migraines Maternal Grandmother     SOCHx:   reports that she quit smoking about 43 years ago. Her smoking use included cigarettes. She quit after 1.00 year of use. She has never used smokeless tobacco. She reports that she does not drink alcohol or use drugs.  ALLERGIES:  Allergies  Allergen Reactions  . Effexor [Venlafaxine Hydrochloride] Shortness Of Breath, Rash and Other (See Comments)    HALLUCINATIONS  . Iodine Anaphylaxis  . Shellfish Allergy Anaphylaxis and Other (See Comments)  . Codeine Swelling and Rash    SWELLING REACTION UNSPECIFIED   . Flexeril [Cyclobenzaprine Hcl] Other (See Comments)    HALLUCINATIONS AND "OUT OF CONTROL"  . Hydromorphone Other (See Comments)    HALLUCINATIONS  . Nubain [Nalbuphine Hcl] Other (See Comments)    HALLUCINATIONS  . Atorvastatin Other (See Comments)    Severe joint pain  . Pravastatin     Joint pain, myalgias  . Erythromycin Rash    ROS: Pertinent items noted in HPI and remainder of comprehensive ROS otherwise negative.  HOME MEDS: Current Outpatient Medications on File Prior to Visit  Medication Sig Dispense Refill  . amLODipine (NORVASC) 10 MG tablet TAKE ONE TABLET BY MOUTH ONE TIME DAILY  30 tablet 7  . aspirin EC 81 MG tablet Take 81 mg by mouth daily.    Marland Kitchen BRILINTA 90 MG TABS tablet TAKE ONE TABLET BY MOUTH TWICE DAILY  60 tablet 7  . buPROPion (WELLBUTRIN XL) 150 MG 24 hr tablet Take 1 tablet (150 mg total) by mouth daily. 90 tablet 3  . busPIRone (BUSPAR) 15 MG tablet TAKE ONE TABLET BY MOUTH TWICE DAILY    . Butalbital-APAP-Caffeine 50-325-40 MG capsule Take 1 capsule by mouth every 6 (six) hours as needed for headache.     . docusate sodium (COLACE) 100 MG capsule Take 1 capsule (100 mg total) by mouth  2 (two) times daily. 30 capsule 0  . Erenumab-aooe (AIMOVIG [place]) Inject into the skin every 30 (thirty) days. Unknown dose Given at Neurologist    . Evolocumab (REPATHA SURECLICK) 973 MG/ML SOAJ Inject 1 Dose into the skin every 14 (fourteen) days. 2 pen 11  . metaxalone (SKELAXIN) 800 MG tablet Take 1 tablet (800 mg total) by mouth 3 (three) times daily as needed for pain. 90 tablet 3  . metoprolol tartrate (LOPRESSOR) 50 MG tablet TAKE ONE TABLET BY MOUTH TWICE DAILY  60 tablet 7  . nitroGLYCERIN (NITROSTAT) 0.4 MG SL tablet Place 1 tablet (0.4 mg total) under the tongue every 5 (five) minutes as needed for chest  pain. 15 tablet 0  . oxyCODONE-acetaminophen (PERCOCET) 10-325 MG tablet Take 1-2 tablets by mouth every 4 (four) hours as needed for pain. 40 tablet 0  . valACYclovir (VALTREX) 500 MG tablet Take 1 PO QD for suppression; INCREASE to BID x 3 days PRN outbreak (Patient taking differently: Take 500 mg by mouth daily. ) 100 tablet 3   No current facility-administered medications on file prior to visit.    LABS/IMAGING: Results for orders placed or performed in visit on 03/11/19 (from the past 48 hour(s))  Lipid panel     Status: Abnormal   Collection Time: 06/15/19  8:50 AM  Result Value Ref Range   Cholesterol, Total 193 100 - 199 mg/dL   Triglycerides 64 0 - 149 mg/dL   HDL 72 >39 mg/dL   VLDL Cholesterol Cal 12 5 - 40 mg/dL   LDL Chol Calc (NIH) 109 (H) 0 - 99 mg/dL   Chol/HDL Ratio 2.7 0.0 - 4.4 ratio    Comment:                                   T. Chol/HDL Ratio                                             Men  Women                               1/2 Avg.Risk  3.4    3.3                                   Avg.Risk  5.0    4.4                                2X Avg.Risk  9.6    7.1                                3X Avg.Risk 23.4   11.0    No results found.  LIPID PANEL:    Component Value Date/Time   CHOL 193 06/15/2019 0850   TRIG 64 06/15/2019 0850   HDL 72  06/15/2019 0850   CHOLHDL 2.7 06/15/2019 0850   CHOLHDL 3.0 10/02/2018 0521   VLDL 7 10/02/2018 0521   LDLCALC 109 (H) 06/15/2019 0850     WEIGHTS: Wt Readings from Last 3 Encounters:  06/16/19 229 lb 6.4 oz (104.1 kg)  03/11/19 223 lb 9.6 oz (101.4 kg)  11/12/18 217 lb 1.9 oz (98.5 kg)    VITALS: BP (!) 158/78   Pulse 65   Ht 5\' 8"  (1.727 m)   Wt 229 lb 6.4 oz (104.1 kg)   BMI 34.88 kg/m   EXAM: General appearance: alert and no distress Neck: no carotid bruit, no JVD and thyroid not enlarged, symmetric, no tenderness/mass/nodules Lungs: clear to auscultation bilaterally Heart: regular rate and rhythm Abdomen: soft, non-tender; bowel sounds normal; no masses,  no organomegaly Extremities: extremities normal, atraumatic, no cyanosis or edema Pulses: 2+ and symmetric Skin: Skin color, texture, turgor normal. No rashes or  lesions Neurologic: Grossly normal Psych: Pleasant  EKG: Normal sinus rhythm at 65-personally reviewed  ASSESSMENT: 1. CAD with 99% mid LAD stenosis status post DES placement (10/02/2018) for NSTEMI 2. Hypertension 3. Dyslipidemia - statin intolerant, myalgias/arthralgias 4. Strong family history of premature coronary disease 5. Migraine headaches 6. Anxiety  PLAN: 1.   Sherri [name] seems to be doing well and is asymptomatic although has had some elevated blood pressures which she attributes to stress at work as well as some awareness of her heart.  Her cholesterol is still elevated with LDL 109 target LDL less than 70.  She cannot take statins.  I think she is a good candidate to add ezetimibe and may reach her target.  We will plan to repeat lipid profile in about 3 months and follow-up with me in 6 months.  Pixie Casino, MD, [facility], [place] Director of the Advanced Lipid Disorders &  Cardiovascular Risk Reduction Clinic Diplomate of the American Board of Clinical Lipidology Attending Cardiologist  Direct  Dial: [phone]  Fax: [phone]  Website:  [email or web address] Osgood Cassara Nida 06/16/2019, 9:24 AM

## 2019-12-24 ENCOUNTER — Other Ambulatory Visit: Payer: Self-pay | Admitting: Physician Assistant

## 2020-01-04 ENCOUNTER — Other Ambulatory Visit: Payer: Self-pay | Admitting: Physician Assistant

## 2020-01-13 ENCOUNTER — Other Ambulatory Visit: Payer: Self-pay | Admitting: *Deleted

## 2020-01-13 DIAGNOSIS — E785 Hyperlipidemia, unspecified: Secondary | ICD-10-CM

## 2020-01-14 ENCOUNTER — Telehealth: Payer: Self-pay | Admitting: Internal Medicine

## 2020-01-14 MED ORDER — REPATHA SURECLICK 140 MG/ML ~~LOC~~ SOAJ: 1.0000 | SUBCUTANEOUS | 3 refills | Status: AC

## 2020-01-14 MED ORDER — EZETIMIBE 10 MG PO TABS: 10.0000 mg | ORAL_TABLET | Freq: Every day | ORAL | 3 refills | Status: DC

## 2020-01-14 NOTE — Telephone Encounter (Signed)
Patient aware of MD advice/recommendations. Advised labs are ordered. Refilled meds per request

## 2020-01-14 NOTE — Telephone Encounter (Signed)
    Pt is returning call from [place].

## 2020-01-14 NOTE — Telephone Encounter (Signed)
Recommend working aggressively at low fat diet, repeating lipids in 2-3 months, just prior to follow-up in March   Dr Lemmie Evens

## 2020-02-11 ENCOUNTER — Other Ambulatory Visit: Payer: Self-pay | Admitting: Internal Medicine

## 2020-02-14 ENCOUNTER — Other Ambulatory Visit: Payer: Self-pay | Admitting: *Deleted

## 2020-02-14 DIAGNOSIS — E785 Hyperlipidemia, unspecified: Secondary | ICD-10-CM

## 2020-02-26 ENCOUNTER — Other Ambulatory Visit: Payer: Self-pay | Admitting: Internal Medicine

## 2020-03-14 LAB — LIPID PANEL
Chol/HDL Ratio: 2.4 ratio (ref 0.0–4.4)
Cholesterol, Total: 166 mg/dL (ref 100–199)
HDL: 70 mg/dL (ref >39)
LDL Chol Calc (NIH): 82 mg/dL (ref 0–99)
Triglycerides: 75 mg/dL (ref 0–149)
VLDL Cholesterol Cal: 14 mg/dL (ref 5–40)

## 2020-03-17 ENCOUNTER — Other Ambulatory Visit: Payer: Self-pay

## 2020-03-17 ENCOUNTER — Encounter: Payer: Self-pay | Admitting: Internal Medicine

## 2020-03-17 ENCOUNTER — Ambulatory Visit (INDEPENDENT_AMBULATORY_CARE_PROVIDER_SITE_OTHER): Admitting: Internal Medicine

## 2020-03-17 VITALS — BP 144/63 | HR 69 | Ht 69.0 in | Wt 224.0 lb

## 2020-03-17 DIAGNOSIS — T466X5D Adverse effect of antihyperlipidemic and antiarteriosclerotic drugs, subsequent encounter: Secondary | ICD-10-CM

## 2020-03-17 DIAGNOSIS — Z9861 Coronary angioplasty status: Secondary | ICD-10-CM

## 2020-03-17 DIAGNOSIS — T466X5A Adverse effect of antihyperlipidemic and antiarteriosclerotic drugs, initial encounter: Secondary | ICD-10-CM

## 2020-03-17 DIAGNOSIS — I2510 Atherosclerotic heart disease of native coronary artery without angina pectoris: Secondary | ICD-10-CM | POA: Diagnosis not present

## 2020-03-17 DIAGNOSIS — R079 Chest pain, unspecified: Secondary | ICD-10-CM

## 2020-03-17 DIAGNOSIS — E785 Hyperlipidemia, unspecified: Secondary | ICD-10-CM | POA: Diagnosis not present

## 2020-03-17 DIAGNOSIS — M791 Myalgia, unspecified site: Secondary | ICD-10-CM

## 2020-03-17 DIAGNOSIS — I10 Essential (primary) hypertension: Secondary | ICD-10-CM | POA: Diagnosis not present

## 2020-03-17 MED ORDER — ISOSORBIDE MONONITRATE ER 30 MG PO TB24: 15.0000 mg | ORAL_TABLET | Freq: Every day | ORAL | 3 refills | Status: DC

## 2020-03-17 NOTE — Patient Instructions (Signed)
Medication Instructions:  START isosorbide mononitrate 15mg  daily STOP brilinta  *If you need a refill on your cardiac medications before your next appointment, please call your pharmacy*  Follow-Up: At [facility], you and your health needs are our priority.  As part of our continuing mission to provide you with exceptional heart care, we have created designated Provider Care Teams.  These Care Teams include your primary Cardiologist (physician) and Advanced Practice Providers (APPs -  Physician Assistants and Nurse Practitioners) who all work together to provide you with the care you need, when you need it.  We recommend signing up for the patient portal called "MyChart".  Sign up information is provided on this After Visit Summary.  MyChart is used to connect with patients for Virtual Visits (Telemedicine).  Patients are able to view lab/test results, encounter notes, upcoming appointments, etc.  Non-urgent messages can be sent to your provider as well.   To learn more about what you can do with MyChart, go to [email or web address].    Your next appointment:   4 week(s)  The format for your next appointment:   In Person or Virtual  Provider:   You may see Pixie Casino, MD or one of the following Advanced Practice Providers on your designated Care Team:    Almyra Deforest, PA-C  Fabian Sharp, PA-C or   Roby Lofts, [place]    Other Instructions

## 2020-03-17 NOTE — Progress Notes (Signed)
OFFICE NOTE  Chief Complaint:  Routine follow-up  Primary Care Physician: Harrison Mons, PA  HPI:  Sherri [name] is a 65 y.o. female with family history of premature CAD in mother's side, but few other cardiac risk factors - does have migraines. Has had 2 days of left/center chest pain, radiating to jaw and left arm - this was associated with headache and she was seeing her neurologist. BP is also noted to be elevated - apparently she had a diagnosis of hypertension, but has not been on medication. She does take chronic pain medication. Mild EKG changes noted - troponin minimally elevated but flat -could represent NSTEMI, but not typical for ACS. Suspect there could be CAD, but may also be coronary vasospasm given history of migraine. She underwent LHC and was found to have a 99% mid-LAD stenosis, s/p PCI with 3.5 x 12 mm and 3.0 x 18 mm DES overlapping. She tolerated this well. Has followed up with our APP's. BP was uncontrolled, however, her amlodipine was increased and it has improved.  Lipids remain above goal.  She initially was on atorvastatin but had significant joint pain with that.  It was discontinued and she was switched to pravastatin.  She says she continues to have issues with that.  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  She also had one episode where she was under significant stress and felt fluttering in her heart and discomfort she took a nitroglycerin and felt some improvement after 30 minutes.  06/16/2019  Sherri [name] returns today for follow-up.  Initially blood pressure was elevated, however came down to 138/78.  She does report some intermittent palpitations however EKG today shows sinus rhythm at 65.  She did have repeat lipids yesterday which showed total cholesterol 193, triglycerides 64, HDL 72 and LDL of 109.  Given coronary disease her target LDL is less than 70 and interestingly her HDL is 72 apparently is not cardioprotective given her known coronary  disease.  Unfortunately she could not tolerate statins.  03/17/2020  Sherri [name] returns today for follow-up.  Blood pressure slightly elevated today.  She denies any chest pain or worsening shortness of breath.  She had labs a few days ago which showed total cholesterol 166, HDL 70, LDL 82 and triglycerides 75.  She still remains just above target LDL less than 70.  She is also still on aspirin and Brilinta having been now 2 years out of her stent.  She remains on Repatha and ezetimibe.  She does get some occasional chest discomfort.  She has tried nitroglycerin with some relief.  She thinks this is due to stress at her job since she works as a Psychiatric nurse.  PMHx:  Past Medical History:  Diagnosis Date  . Anxiety   . Arthritis   . Breast pain, right   . Cancer [facility])    ? breast, cervical   . Closed left ankle fracture 12/17/2015  . Depression    situational  . Hypertension    not taken atenolol or lisinopril in >56mos, no money  . Migraines   . Pneumonia   . Ruptured disk     Past Surgical History:  Procedure Laterality Date  . AUGMENTATION MAMMAPLASTY Right    1979  . BREAST SURGERY     lumpectomy in 20s for benign mass with implant placement (subglandular)  . CARPAL TUNNEL RELEASE     right wrist  . CARPAL TUNNEL RELEASE Right 09/02/2014   Procedure: RIGHT  CARPAL TUNNEL RELEASE;  Surgeon: Melrose Nakayama, MD;  Location: [place];  Service: Orthopedics;  Laterality: Right;  . CERVICAL CONE BIOPSY    . COLONOSCOPY    . CORONARY STENT INTERVENTION N/A 10/02/2018   Procedure: CORONARY STENT INTERVENTION;  Surgeon: Nelva Bush, MD;  Location: [place] CV [facility];  Service: Cardiovascular;  Laterality: N/A;  . KNEE ARTHROSCOPY Left 07/1996  . KNEE ARTHROSCOPY Right 1998  . LEFT HEART CATH AND CORONARY ANGIOGRAPHY N/A 10/02/2018   Procedure: LEFT HEART CATH AND CORONARY ANGIOGRAPHY;  Surgeon: Nelva Bush, MD;  Location: [place] [facility];  Service: Cardiovascular;   Laterality: N/A;  . ORIF ANKLE FRACTURE Left 12/17/2015   Procedure: OPEN REDUCTION INTERNAL FIXATION (ORIF) ANKLE FRACTURE;  Surgeon: Milly Jakob, MD;  [facility];  Service: Orthopedics;  Laterality: Left;  . SPINE SURGERY  72011   L2-L5 stabilization  . TOTAL HIP ARTHROPLASTY Right 10/15/2016  . TOTAL HIP ARTHROPLASTY Right 10/15/2016   Procedure: TOTAL HIP ARTHROPLASTY ANTERIOR APPROACH;  Surgeon: Melrose Nakayama, MD;  Location: [place];  Service: Orthopedics;  Laterality: Right;    FAMHx:  Family History  Problem Relation Age of Onset  . Diabetes Mother   . Glaucoma Mother   . Cancer Mother        breast, ovarian  . Heart disease Mother   . Breast cancer Mother 76  . Migraines Mother   . Gout Father   . Cancer Sister        ovarian, uterine cancer  . Migraines Sister   . Migraines Brother   . Migraines Maternal Grandmother     SOCHx:   reports that she quit smoking about 44 years ago. Her smoking use included cigarettes. She quit after 1.00 year of use. She has never used smokeless tobacco. She reports that she does not drink alcohol and does not use drugs.  ALLERGIES:  Allergies  Allergen Reactions  . Effexor [Venlafaxine Hydrochloride] Shortness Of Breath, Rash and Other (See Comments)    HALLUCINATIONS  . Iodine Anaphylaxis  . Shellfish Allergy Anaphylaxis and Other (See Comments)  . Codeine Swelling and Rash    SWELLING REACTION UNSPECIFIED   . Flexeril [Cyclobenzaprine Hcl] Other (See Comments)    HALLUCINATIONS AND "OUT OF CONTROL"  . Hydromorphone Other (See Comments)    HALLUCINATIONS  . Nubain [Nalbuphine Hcl] Other (See Comments)    HALLUCINATIONS  . Atorvastatin Other (See Comments)    Severe joint pain  . Pravastatin     Joint pain, myalgias  . Erythromycin Rash    ROS: Pertinent items noted in HPI and remainder of comprehensive ROS otherwise negative.  HOME MEDS: Current Outpatient Medications on File Prior to Visit  Medication Sig  Dispense Refill  . amLODipine (NORVASC) 10 MG tablet TAKE ONE TABLET BY MOUTH ONE TIME DAILY  30 tablet 7  . aspirin EC 81 MG tablet Take 81 mg by mouth daily.    Marland Kitchen BRILINTA 90 MG TABS tablet TAKE ONE TABLET BY MOUTH TWICE DAILY 60 tablet 4  . buPROPion (WELLBUTRIN XL) 150 MG 24 hr tablet Take 1 tablet (150 mg total) by mouth daily. 90 tablet 3  . busPIRone (BUSPAR) 15 MG tablet TAKE ONE TABLET BY MOUTH TWICE DAILY    . Butalbital-APAP-Caffeine 50-325-40 MG capsule Take 1 capsule by mouth every 6 (six) hours as needed for headache.     . docusate sodium (COLACE) 100 MG capsule Take 1 capsule (100 mg total) by mouth 2 (two)  times daily. 30 capsule 0  . Erenumab-aooe (AIMOVIG [place]) Inject into the skin every 30 (thirty) days. Unknown dose Given at Neurologist    . Evolocumab (REPATHA SURECLICK) 614 MG/ML SOAJ Inject 1 Dose into the skin every 14 (fourteen) days. 2 mL 3  . ezetimibe (ZETIA) 10 MG tablet Take 1 tablet (10 mg total) by mouth daily. 90 tablet 3  . metaxalone (SKELAXIN) 800 MG tablet Take 1 tablet (800 mg total) by mouth 3 (three) times daily as needed for pain. 90 tablet 3  . metoprolol tartrate (LOPRESSOR) 50 MG tablet TAKE ONE TABLET BY MOUTH TWICE DAILY 60 tablet 5  . nitroGLYCERIN (NITROSTAT) 0.4 MG SL tablet Place 1 tablet (0.4 mg total) under the tongue every 5 (five) minutes as needed for chest pain. 15 tablet 0  . oxyCODONE-acetaminophen (PERCOCET) 10-325 MG tablet Take 1-2 tablets by mouth every 4 (four) hours as needed for pain. 40 tablet 0  . valACYclovir (VALTREX) 500 MG tablet Take 1 PO QD for suppression; INCREASE to BID x 3 days PRN outbreak (Patient taking differently: Take 500 mg by mouth daily.) 100 tablet 3   No current facility-administered medications on file prior to visit.    LABS/IMAGING: No results found for this or any previous visit (from the past 48 hour(s)). No results found.  LIPID PANEL:    Component Value Date/Time   CHOL 166 03/14/2020 1019    TRIG 75 03/14/2020 1019   HDL 70 03/14/2020 1019   CHOLHDL 2.4 03/14/2020 1019   CHOLHDL 3.0 10/02/2018 0521   VLDL 7 10/02/2018 0521   LDLCALC 82 03/14/2020 1019     WEIGHTS: Wt Readings from Last 3 Encounters:  03/17/20 224 lb (101.6 kg)  06/16/19 229 lb 6.4 oz (104.1 kg)  03/11/19 223 lb 9.6 oz (101.4 kg)    VITALS: BP (!) 144/63   Pulse 69   Ht 5\' 9"  (1.753 m)   Wt 224 lb (101.6 kg)   SpO2 99%   BMI 33.08 kg/m   EXAM: General appearance: alert and no distress Neck: no carotid bruit, no JVD and thyroid not enlarged, symmetric, no tenderness/mass/nodules Lungs: clear to auscultation bilaterally Heart: regular rate and rhythm Abdomen: soft, non-tender; bowel sounds normal; no masses,  no organomegaly Extremities: extremities normal, atraumatic, no cyanosis or edema Pulses: 2+ and symmetric Skin: Skin color, texture, turgor normal. No rashes or lesions Neurologic: Grossly normal Psych: Pleasant  EKG: Deferred  ASSESSMENT: 1. Chest pain, ?stress-related 2. CAD with 99% mid LAD stenosis status post DES placement (10/02/2018) for NSTEMI 3. Hypertension 4. Dyslipidemia - statin intolerant, myalgias/arthralgias 5. Strong family history of premature coronary disease 6. Migraine headaches 7. Anxiety  PLAN: 1.   Ms. Mcenroe is describing what may be angina or stress related symptoms in the chest.  She has had some improvement with nitro.  Her LAD stenosis was successfully stented in September 2020.  At this point she does not need to continue with dual antiplatelet therapy.  I would recommend discontinuing Brilinta, however if her pain starts to come back then she should restart it and notify me.  It is possible she could have some small vessel disease.  We discussed options and ultimately decided low-dose isosorbide might be helpful.  We will start that.  She should be mindful of the fact that it might cause migraines which she has a history of.  Contact me with any issues.   Plan follow-up in 1 month to evaluate response to therapy.  Chrissie Noa  C. Debara Pickett, MD, [facility], [place] Director of the Advanced Lipid Disorders &  Cardiovascular Risk Reduction Clinic Diplomate of the American Board of Clinical Lipidology Attending Cardiologist  Direct Dial: [phone]  Fax: [phone]  Website:  [email or web address] 03/17/2020, 3:31 PM

## 2020-04-20 ENCOUNTER — Ambulatory Visit: Admitting: Internal Medicine

## 2020-05-12 ENCOUNTER — Other Ambulatory Visit: Payer: Self-pay | Admitting: Nurse Practitioner

## 2020-05-12 DIAGNOSIS — M545 Low back pain, unspecified: Secondary | ICD-10-CM

## 2020-07-12 ENCOUNTER — Other Ambulatory Visit: Payer: Self-pay

## 2020-07-12 ENCOUNTER — Encounter (HOSPITAL_BASED_OUTPATIENT_CLINIC_OR_DEPARTMENT_OTHER): Payer: Self-pay | Admitting: Emergency Medicine

## 2020-07-12 ENCOUNTER — Emergency Department (HOSPITAL_BASED_OUTPATIENT_CLINIC_OR_DEPARTMENT_OTHER)
Admission: EM | Admit: 2020-07-12 | Discharge: 2020-07-12 | Disposition: A | Discharge status: Home / Self Care | Attending: Emergency Medicine | Admitting: Emergency Medicine

## 2020-07-12 DIAGNOSIS — Z87891 Personal history of nicotine dependence: Secondary | ICD-10-CM | POA: Insufficient documentation

## 2020-07-12 DIAGNOSIS — Z85828 Personal history of other malignant neoplasm of skin: Secondary | ICD-10-CM | POA: Diagnosis not present

## 2020-07-12 DIAGNOSIS — I2510 Atherosclerotic heart disease of native coronary artery without angina pectoris: Secondary | ICD-10-CM | POA: Insufficient documentation

## 2020-07-12 DIAGNOSIS — Z96641 Presence of right artificial hip joint: Secondary | ICD-10-CM | POA: Insufficient documentation

## 2020-07-12 DIAGNOSIS — G43809 Other migraine, not intractable, without status migrainosus: Secondary | ICD-10-CM

## 2020-07-12 DIAGNOSIS — Z7982 Long term (current) use of aspirin: Secondary | ICD-10-CM | POA: Insufficient documentation

## 2020-07-12 DIAGNOSIS — Z853 Personal history of malignant neoplasm of breast: Secondary | ICD-10-CM | POA: Insufficient documentation

## 2020-07-12 DIAGNOSIS — I10 Essential (primary) hypertension: Secondary | ICD-10-CM | POA: Insufficient documentation

## 2020-07-12 DIAGNOSIS — Z79899 Other long term (current) drug therapy: Secondary | ICD-10-CM | POA: Diagnosis not present

## 2020-07-12 DIAGNOSIS — R519 Headache, unspecified: Secondary | ICD-10-CM | POA: Insufficient documentation

## 2020-07-12 LAB — BASIC METABOLIC PANEL
Anion gap: 9 (ref 5–15)
BUN: 16 mg/dL (ref 8–23)
CO2: 27 mmol/L (ref 22–32)
Calcium: 9.6 mg/dL (ref 8.9–10.3)
Chloride: 100 mmol/L (ref 98–111)
Creatinine, Ser: 0.82 mg/dL (ref 0.44–1.00)
GFR, Estimated: >60 mL/min (ref >60)
Glucose, Bld: 107 mg/dL — ABNORMAL HIGH (ref 70–99)
Potassium: 4.5 mmol/L (ref 3.5–5.1)
Sodium: 136 mmol/L (ref 135–145)

## 2020-07-12 LAB — CBC
HCT: 40.5 % (ref 36.0–46.0)
Hemoglobin: 12.8 g/dL (ref 12.0–15.0)
MCH: 27.2 pg (ref 26.0–34.0)
MCHC: 31.6 g/dL (ref 30.0–36.0)
MCV: 86 fL (ref 80.0–100.0)
Platelets: 362 10*3/uL (ref 150–400)
RBC: 4.71 MIL/uL (ref 3.87–5.11)
RDW: 13.3 % (ref 11.5–15.5)
WBC: 8.5 10*3/uL (ref 4.0–10.5)
nRBC: 0 % (ref 0.0–0.2)

## 2020-07-12 LAB — CBG MONITORING, ED: Glucose-Capillary: 101 mg/dL — ABNORMAL HIGH (ref 70–99)

## 2020-07-12 MED ORDER — KETOROLAC TROMETHAMINE 30 MG/ML IJ SOLN
15.0000 mg | Freq: Once | INTRAMUSCULAR | Status: AC
  Administered 2020-07-12: 15 mg via INTRAVENOUS
  Filled 2020-07-12: qty 1

## 2020-07-12 MED ORDER — DEXAMETHASONE SODIUM PHOSPHATE 10 MG/ML IJ SOLN
10.0000 mg | Freq: Once | INTRAMUSCULAR | Status: AC
  Administered 2020-07-12: 10 mg via INTRAVENOUS
  Filled 2020-07-12: qty 1

## 2020-07-12 MED ORDER — METOCLOPRAMIDE HCL 5 MG/ML IJ SOLN
5.0000 mg | Freq: Once | INTRAMUSCULAR | Status: AC
  Administered 2020-07-12: 5 mg via INTRAVENOUS
  Filled 2020-07-12: qty 2

## 2020-07-12 NOTE — ED Triage Notes (Signed)
Pt arrives to ED with c/o of headache, dizziness, and cold sweats. Pt reports headache posteriorly x4 days. Pt reports dizziness starting yesterday associated with flashing black dots. Today pt had episode of cold sweats that made her feel like she was going to pass out. Pt states she had COVID x2 weeks ago.

## 2020-07-12 NOTE — ED Provider Notes (Signed)
[facility] EMERGENC[facility] DEPT Provider Note   CSN: [id number] Arrival date & time: 07/12/20  1316     History Chief Complaint  Patient presents with   Dizziness    Sherri [name] is a 65 y.o. female.  65 year old female with history of migraines presents with several days of occipital headache consistent with her prior migraines.  Denies any fever or vomiting.  No neurological features.  States the headache became intense today she felt she is got a pass out.  Denies any chest pain or shortness of breath.  States that she has not been sensitive to light or sound.  Feels better at this time.  Did recently have COVID      Past Medical History:  Diagnosis Date   Anxiety    Arthritis    Breast pain, right    Cancer ([place])    ? breast, cervical    Closed left ankle fracture 12/17/2015   Depression    situational   Hypertension    not taken atenolol or lisinopril in >31mos, no money   Migraines    Pneumonia    Ruptured disk     Patient Active Problem List   Diagnosis Date Noted   CAD S/P percutaneous coronary angioplasty 11/12/2018   Chronic migraine without aura, with intractable migraine, so stated, with status migrainosus 10/03/2018   NSTEMI (non-ST elevated myocardial infarction) ([place]) 10/02/2018   Migraines    Dyslipidemia, goal LDL below 70 06/10/2017   Prediabetes 06/10/2017   Ruptured right breast implant 05/27/2017   Chronic bilateral thoracic back pain 05/27/2017   Primary localized osteoarthritis of right hip 10/15/2016   Spondylolysis of cervical region 09/03/2016   Lumbar spondylosis 09/03/2016   Displacement of cervical intervertebral disc 09/03/2016   Chest pain 12/19/2015   Essential (primary) hypertension 09/23/2013   Obesity, unspecified 09/23/2013   Intractable chronic migraine without aura 02/15/2011   Obesity 02/14/2011   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   Blindness of right eye 02/14/2011   Depression 02/14/2011     Past Surgical History:  Procedure Laterality Date   AUGMENTATION MAMMAPLASTY Right    1979   BREAST SURGERY     lumpectomy in 20s for benign mass with implant placement (subglandular)   CARPAL TUNNEL RELEASE     right wrist   CARPAL TUNNEL RELEASE Right 09/02/2014   Procedure: RIGHT CARPAL TUNNEL RELEASE;  Surgeon: Melrose Nakayama, MD;  Location: [facility];  Service: Orthopedics;  Laterality: Right;   CERVICAL CONE BIOPSY     COLONOSCOPY     CORONARY STENT INTERVENTION N/A 10/02/2018   Procedure: CORONARY STENT INTERVENTION;  Surgeon: Nelva Bush, MD;  Location: [facility];  Service: Cardiovascular;  Laterality: N/A;   KNEE ARTHROSCOPY Left 07/1996   KNEE ARTHROSCOPY Right 1998   LEFT HEART CATH AND CORONARY ANGIOGRAPHY N/A 10/02/2018   Procedure: LEFT HEART CATH AND CORONARY ANGIOGRAPHY;  Surgeon: Nelva Bush, MD;  Location: [facility];  Service: Cardiovascular;  Laterality: N/A;   ORIF ANKLE FRACTURE Left 12/17/2015   Procedure: OPEN REDUCTION INTERNAL FIXATION (ORIF) ANKLE FRACTURE;  Surgeon: Milly Jakob, MD;  Location: [facility];  Service: Orthopedics;  Laterality: Left;   SPINE SURGERY  72011   L2-L5 stabilization   TOTAL HIP ARTHROPLASTY Right 10/15/2016   TOTAL HIP ARTHROPLASTY Right 10/15/2016   Procedure: TOTAL HIP ARTHROPLASTY ANTERIOR APPROACH;  Surgeon: Melrose Nakayama, MD;  Location: [facility];  Service: Orthopedics;  Laterality: Right;  OB History   No obstetric history on file.     Family History  Problem Relation Age of Onset   Diabetes Mother    Glaucoma Mother    Cancer Mother        breast, ovarian   Heart disease Mother    Breast cancer Mother 51   Migraines Mother    Gout Father    Cancer Sister        ovarian, uterine cancer   Migraines Sister    Migraines Brother    Migraines Maternal Grandmother     Social History   Tobacco Use   Smoking status: Former    Years: 1.00    Pack years: 0.00    Types:  Cigarettes    Quit date: 01/08/1976    Years since quitting: 44.5   Smokeless tobacco: Never  Vaping Use   Vaping Use: Never used  Substance Use Topics   Alcohol use: No    Alcohol/week: 0.0 standard drinks   Drug use: No    Home Medications Prior to Admission medications   Medication Sig Start Date End Date Taking? Authorizing Provider  amLODipine (NORVASC) 10 MG tablet TAKE ONE TABLET BY MOUTH ONE TIME DAILY  03/05/19   Almyra Deforest, PA  aspirin EC 81 MG tablet Take 81 mg by mouth daily.    [provider]  buPROPion (WELLBUTRIN XL) 150 MG 24 hr tablet Take 1 tablet (150 mg total) by mouth daily. 05/06/17   Harrison Mons, PA  busPIRone (BUSPAR) 15 MG tablet TAKE ONE TABLET BY MOUTH TWICE DAILY 03/05/19   [provider]  Butalbital-APAP-Caffeine [phone] MG capsule Take 1 capsule by mouth every 6 (six) hours as needed for headache.  05/12/17   [provider]  docusate sodium (COLACE) 100 MG capsule Take 1 capsule (100 mg total) by mouth 2 (two) times daily. 10/18/16   Loni Dolly, PA-C  Erenumab-aooe (AIMOVIG [place]) Inject into the skin every 30 (thirty) days. Unknown dose Given at Neurologist    [provider]  Evolocumab (REPATHA SURECLICK) 675 MG/ML SOAJ Inject 1 Dose into the skin every 14 (fourteen) days. 01/14/20   Hilty, Nadean Corwin, MD  ezetimibe (ZETIA) 10 MG tablet Take 1 tablet (10 mg total) by mouth daily. 01/14/20 04/13/20  Pixie Casino, MD  isosorbide mononitrate (IMDUR) 30 MG 24 hr tablet Take 0.5 tablets (15 mg total) by mouth daily. 03/17/20 06/15/20  Pixie Casino, MD  metaxalone (SKELAXIN) 800 MG tablet Take 1 tablet (800 mg total) by mouth 3 (three) times daily as needed for pain. 02/27/12   Harrison Mons, PA  metoprolol tartrate (LOPRESSOR) 50 MG tablet TAKE ONE TABLET BY MOUTH TWICE DAILY 02/14/20   Hilty, Nadean Corwin, MD  nitroGLYCERIN (NITROSTAT) 0.4 MG SL tablet Place 1 tablet (0.4 mg total) under the tongue every 5 (five) minutes as needed  for chest pain. 10/03/18   Elodia Florence., MD  oxyCODONE-acetaminophen (PERCOCET) 10-325 MG tablet Take 1-2 tablets by mouth every 4 (four) hours as needed for pain. 10/18/16   Loni Dolly, PA-C  valACYclovir (VALTREX) 500 MG tablet Take 1 PO QD for suppression; INCREASE to BID x 3 days PRN outbreak Patient taking differently: Take 500 mg by mouth daily. 05/06/17   Harrison Mons, PA    Allergies    Effexor [venlafaxine hydrochloride], Iodine, Shellfish allergy, Codeine, Flexeril [cyclobenzaprine hcl], Hydromorphone, Nubain [nalbuphine hcl], Atorvastatin, Pravastatin, and Erythromycin  Review of Systems   Review of Systems  All  other systems reviewed and are negative.  Physical Exam Updated Vital Signs BP 136/67 (BP Location: Right Arm)   Pulse 73   Temp 98.3 F (36.8 C) (Oral)   Resp 16   Ht 1.727 m (5\' 8" )   Wt 103 kg   SpO2 99%   BMI 34.52 kg/m   Physical Exam Vitals and nursing note reviewed.  Constitutional:      General: She is not in acute distress.    Appearance: Normal appearance. She is well-developed. She is not toxic-appearing.  HENT:     Head: Normocephalic and atraumatic.  Eyes:     General: Lids are normal.     Conjunctiva/sclera: Conjunctivae normal.     Pupils: Pupils are equal, round, and reactive to light.  Neck:     Thyroid: No thyroid mass.     Trachea: No tracheal deviation.  Cardiovascular:     Rate and Rhythm: Normal rate and regular rhythm.     Heart sounds: Normal heart sounds. No murmur heard.   No gallop.  Pulmonary:     Effort: Pulmonary effort is normal. No respiratory distress.     Breath sounds: Normal breath sounds. No stridor. No decreased breath sounds, wheezing, rhonchi or rales.  Abdominal:     General: There is no distension.     Palpations: Abdomen is soft.     Tenderness: There is no abdominal tenderness. There is no rebound.  Musculoskeletal:        General: No tenderness. Normal range of motion.     Cervical back:  Normal range of motion and neck supple.  Skin:    General: Skin is warm and dry.     Findings: No abrasion or rash.  Neurological:     General: No focal deficit present.     Mental Status: She is alert and oriented to person, place, and time. Mental status is at baseline.     GCS: GCS eye subscore is 4. GCS verbal subscore is 5. GCS motor subscore is 6.     Cranial Nerves: Cranial nerves are intact. No cranial nerve deficit.     Sensory: No sensory deficit.     Motor: Motor function is intact.     Coordination: Coordination is intact.     Gait: Gait is intact.  Psychiatric:        Attention and Perception: Attention normal.        Speech: Speech normal.        Behavior: Behavior normal.    ED Results / Procedures / Treatments   Labs (all labs ordered are listed, but only abnormal results are displayed) Labs Reviewed  BASIC METABOLIC PANEL - Abnormal; Notable for the following components:      Result Value   Glucose, Bld 107 (*)    All other components within normal limits  CBC  URINALYSIS, ROUTINE W REFLEX MICROSCOPIC  CBG MONITORING, ED    EKG EKG Interpretation  Date/Time:  Wednesday July 12 2020 13:58:41 EDT Ventricular Rate:  73 PR Interval:  168 QRS Duration: 76 QT Interval:  414 QTC Calculation: 456 R Axis:   19 Text Interpretation: Sinus rhythm with occasional Premature ventricular complexes Anterior infarct , age undetermined Abnormal ECG Confirmed by Octaviano Glow [phone]) on 07/12/2020 2:34:29 PM  Radiology No results found.  Procedures Procedures   Medications Ordered in ED Medications  ketorolac (TORADOL) 30 MG/ML injection 15 mg (has no administration in time range)  metoCLOPramide (REGLAN) injection 5 mg (has no administration in  time range)  dexamethasone (DECADRON) injection 10 mg (has no administration in time range)    ED Course  I have reviewed the triage vital signs and the nursing notes.  Pertinent labs & imaging results that were  available during my care of the patient were reviewed by me and considered in my medical decision making (see chart for details).    MDM Rules/Calculators/A&P                          Patient treated for her migraine headache with headache cocktail and feels better.  Neurological exam is stable.  Will discharge Final Clinical Impression(s) / ED Diagnoses Final diagnoses:  None    Rx / DC Orders ED Discharge Orders     None        Lacretia Leigh, MD 07/12/20 1727

## 2020-08-03 ENCOUNTER — Other Ambulatory Visit: Payer: Self-pay | Admitting: Nurse Practitioner

## 2020-08-03 DIAGNOSIS — M545 Low back pain, unspecified: Secondary | ICD-10-CM

## 2020-08-22 ENCOUNTER — Encounter: Payer: Self-pay | Admitting: Internal Medicine

## 2020-08-22 ENCOUNTER — Other Ambulatory Visit: Payer: Self-pay

## 2020-08-22 ENCOUNTER — Ambulatory Visit (INDEPENDENT_AMBULATORY_CARE_PROVIDER_SITE_OTHER): Admitting: Internal Medicine

## 2020-08-22 VITALS — BP 140/80 | HR 81 | Ht 68.0 in | Wt 222.8 lb

## 2020-08-22 DIAGNOSIS — M791 Myalgia, unspecified site: Secondary | ICD-10-CM | POA: Diagnosis not present

## 2020-08-22 DIAGNOSIS — R079 Chest pain, unspecified: Secondary | ICD-10-CM

## 2020-08-22 DIAGNOSIS — E785 Hyperlipidemia, unspecified: Secondary | ICD-10-CM | POA: Diagnosis not present

## 2020-08-22 DIAGNOSIS — I2510 Atherosclerotic heart disease of native coronary artery without angina pectoris: Secondary | ICD-10-CM

## 2020-08-22 DIAGNOSIS — T466X5D Adverse effect of antihyperlipidemic and antiarteriosclerotic drugs, subsequent encounter: Secondary | ICD-10-CM

## 2020-08-22 DIAGNOSIS — Z9861 Coronary angioplasty status: Secondary | ICD-10-CM

## 2020-08-22 DIAGNOSIS — T466X5A Adverse effect of antihyperlipidemic and antiarteriosclerotic drugs, initial encounter: Secondary | ICD-10-CM

## 2020-08-22 NOTE — Patient Instructions (Signed)
  Follow-Up: At [facility], you and your health needs are our priority.  As part of our continuing mission to provide you with exceptional heart care, we have created designated Provider Care Teams.  These Care Teams include your primary Cardiologist (physician) and Advanced Practice Providers (APPs -  Physician Assistants and Nurse Practitioners) who all work together to provide you with the care you need, when you need it.  We recommend signing up for the patient portal called "MyChart".  Sign up information is provided on this After Visit Summary.  MyChart is used to connect with patients for Virtual Visits (Telemedicine).  Patients are able to view lab/test results, encounter notes, upcoming appointments, etc.  Non-urgent messages can be sent to your provider as well.   To learn more about what you can do with MyChart, go to [email or web address].    Your next appointment:   3 month(s)  The format for your next appointment:   In Person  Provider:   K. Mali Hilty, MD

## 2020-08-22 NOTE — Progress Notes (Signed)
OFFICE NOTE  Chief Complaint:  Routine follow-up  Primary Care Physician: Harrison Mons, PA  HPI:  Sherri [name] is a 65 y.o. female with family history of premature CAD in mother's side, but few other cardiac risk factors - does have migraines. Has had 2 days of left/center chest pain, radiating to jaw and left arm - this was associated with headache and she was seeing her neurologist. BP is also noted to be elevated - apparently she had a diagnosis of hypertension, but has not been on medication. She does take chronic pain medication. Mild EKG changes noted - troponin minimally elevated but flat -could represent NSTEMI, but not typical for ACS. Suspect there could be CAD, but may also be coronary vasospasm given history of migraine. She underwent LHC and was found to have a 99% mid-LAD stenosis, s/p PCI with 3.5 x 12 mm and 3.0 x 18 mm DES overlapping. She tolerated this well. Has followed up with our APP's. BP was uncontrolled, however, her amlodipine was increased and it has improved.  Lipids remain above goal.  She initially was on atorvastatin but had significant joint pain with that.  It was discontinued and she was switched to pravastatin.  She says she continues to have issues with that.  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  She also had one episode where she was under significant stress and felt fluttering in her heart and discomfort she took a nitroglycerin and felt some improvement after 30 minutes.  06/16/2019  Sherri [name] returns today for follow-up.  Initially blood pressure was elevated, however came down to 138/78.  She does report some intermittent palpitations however EKG today shows sinus rhythm at 65.  She did have repeat lipids yesterday which showed total cholesterol 193, triglycerides 64, HDL 72 and LDL of 109.  Given coronary disease her target LDL is less than 70 and interestingly her HDL is 72 apparently is not cardioprotective given her known coronary  disease.  Unfortunately she could not tolerate statins.  03/17/2020  Sherri [name] returns today for follow-up.  Blood pressure slightly elevated today.  She denies any chest pain or worsening shortness of breath.  She had labs a few days ago which showed total cholesterol 166, HDL 70, LDL 82 and triglycerides 75.  She still remains just above target LDL less than 70.  She is also still on aspirin and Brilinta having been now 2 years out of her stent.  She remains on Repatha and ezetimibe.  She does get some occasional chest discomfort.  She has tried nitroglycerin with some relief.  She thinks this is due to stress at her job since she works as a Psychiatric nurse.  08/23/2020  Sherri [name] is seen today in follow-up.  She does report some occasional chest discomfort but in general it sounds like she may have had some improvement after starting long-acting nitrates.  She does report being under quite a bit of stress.  Cholesterol numbers stable reasonably well-controlled as of March 2022 with LDL 82.  Unfortunately she is not currently taking her Repatha due to cost issues.  She had applied for a grant but that may run out.  She has also had a lot of other financial stressors that have taken precedence over her medications.  PMHx:  Past Medical History:  Diagnosis Date   Anxiety    Arthritis    Breast pain, right    Cancer ([place])    ? breast, cervical  Closed left ankle fracture 12/17/2015   Depression    situational   Hypertension    not taken atenolol or lisinopril in >65mo, no money   Migraines    Pneumonia    Ruptured disk     Past Surgical History:  Procedure Laterality Date   AUGMENTATION MAMMAPLASTY Right    1979   BREAST SURGERY     lumpectomy in 20s for benign mass with implant placement (subglandular)   CARPAL TUNNEL RELEASE     right wrist   CARPAL TUNNEL RELEASE Right 09/02/2014   Procedure: RIGHT CARPAL TUNNEL RELEASE;  Surgeon: PMelrose Nakayama MD;  [facility]   Service: Orthopedics;  Laterality: Right;   CERVICAL CONE BIOPSY     COLONOSCOPY     CORONARY STENT INTERVENTION N/A 10/02/2018   Procedure: CORONARY STENT INTERVENTION;  Surgeon: ENelva Bush MD;  [facility];  Service: Cardiovascular;  Laterality: N/A;   KNEE ARTHROSCOPY Left 07/1996   KNEE ARTHROSCOPY Right 1998   LEFT HEART CATH AND CORONARY ANGIOGRAPHY N/A 10/02/2018   Procedure: LEFT HEART CATH AND CORONARY ANGIOGRAPHY;  Surgeon: ENelva Bush MD;  [facility];  Service: Cardiovascular;  Laterality: N/A;   ORIF ANKLE FRACTURE Left 12/17/2015   Procedure: OPEN REDUCTION INTERNAL FIXATION (ORIF) ANKLE FRACTURE;  Surgeon: DMilly Jakob MD;  [facility];  Service: Orthopedics;  Laterality: Left;   SPINE SURGERY  72011   L2-L5 stabilization   TOTAL HIP ARTHROPLASTY Right 10/15/2016   TOTAL HIP ARTHROPLASTY Right 10/15/2016   Procedure: TOTAL HIP ARTHROPLASTY ANTERIOR APPROACH;  Surgeon: DMelrose Nakayama MD;  [facility]  Service: Orthopedics;  Laterality: Right;    FAMHx:  Family History  Problem Relation Age of Onset   Diabetes Mother    Glaucoma Mother    Cancer Mother        breast, ovarian   Heart disease Mother    Breast cancer Mother 735  Migraines Mother    Gout Father    Cancer Sister        ovarian, uterine cancer   Migraines Sister    Migraines Brother    Migraines Maternal Grandmother     SOCHx:   reports that she quit smoking about 44 years ago. Her smoking use included cigarettes. She has never used smokeless tobacco. She reports that she does not drink alcohol and does not use drugs.  ALLERGIES:  Allergies  Allergen Reactions   Effexor [Venlafaxine Hydrochloride] Shortness Of Breath, Rash and Other (See Comments)    HALLUCINATIONS   Iodine Anaphylaxis   Shellfish Allergy Anaphylaxis and Other (See Comments)   Codeine Swelling and Rash    SWELLING REACTION UNSPECIFIED    Flexeril [Cyclobenzaprine Hcl]  Other (See Comments)    HALLUCINATIONS AND "OUT OF CONTROL"   Hydromorphone Other (See Comments)    HALLUCINATIONS   Nubain [Nalbuphine Hcl] Other (See Comments)    HALLUCINATIONS   Atorvastatin Other (See Comments)    Severe joint pain   Pravastatin     Joint pain, myalgias   Erythromycin Rash    ROS: Pertinent items noted in HPI and remainder of comprehensive ROS otherwise negative.  HOME MEDS: Current Outpatient Medications on File Prior to Visit  Medication Sig Dispense Refill   amLODipine (NORVASC) 10 MG tablet TAKE ONE TABLET BY MOUTH ONE TIME DAILY  30 tablet 7   aspirin EC 81 MG tablet Take 81 mg by mouth daily.     buPROPion (  WELLBUTRIN XL) 150 MG 24 hr tablet Take 1 tablet (150 mg total) by mouth daily. 90 tablet 3   busPIRone (BUSPAR) 15 MG tablet TAKE ONE TABLET BY MOUTH TWICE DAILY     Butalbital-APAP-Caffeine 50-325-40 MG capsule Take 1 capsule by mouth every 6 (six) hours as needed for headache.      docusate sodium (COLACE) 100 MG capsule Take 1 capsule (100 mg total) by mouth 2 (two) times daily. 30 capsule 0   metaxalone (SKELAXIN) 800 MG tablet Take 1 tablet (800 mg total) by mouth 3 (three) times daily as needed for pain. 90 tablet 3   metoprolol tartrate (LOPRESSOR) 50 MG tablet TAKE ONE TABLET BY MOUTH TWICE DAILY 60 tablet 5   nitroGLYCERIN (NITROSTAT) 0.4 MG SL tablet Place 1 tablet (0.4 mg total) under the tongue every 5 (five) minutes as needed for chest pain. 15 tablet 0   oxyCODONE-acetaminophen (PERCOCET) 10-325 MG tablet Take 1-2 tablets by mouth every 4 (four) hours as needed for pain. 40 tablet 0   valACYclovir (VALTREX) 500 MG tablet Take 1 PO QD for suppression; INCREASE to BID x 3 days PRN outbreak (Patient taking differently: Take 500 mg by mouth daily.) 100 tablet 3   Erenumab-aooe (AIMOVIG ) Inject into the skin every 30 (thirty) days. Unknown dose Given at Neurologist (Patient not taking: Reported on 08/22/2020)     Evolocumab (REPATHA  SURECLICK) [id number] MG/ML SOAJ Inject 1 Dose into the skin every 14 (fourteen) days. (Patient not taking: Reported on 08/22/2020) 2 mL 3   ezetimibe (ZETIA) 10 MG tablet Take 1 tablet (10 mg total) by mouth daily. 90 tablet 3   isosorbide mononitrate (IMDUR) 30 MG 24 hr tablet Take 0.5 tablets (15 mg total) by mouth daily. 45 tablet 3   No current facility-administered medications on file prior to visit.    LABS/IMAGING: No results found for this or any previous visit (from the past 48 hour(s)). No results found.  LIPID PANEL:    Component Value Date/Time   CHOL 166 03/14/2020 1019   TRIG 75 03/14/2020 1019   HDL 70 03/14/2020 1019   CHOLHDL 2.4 03/14/2020 1019   CHOLHDL 3.0 10/02/2018 0521   VLDL 7 10/02/2018 0521   LDLCALC 82 03/14/2020 1019     WEIGHTS: Wt Readings from Last 3 Encounters:  08/22/20 222 lb 12.8 oz (101.1 kg)  07/12/20 227 lb (103 kg)  03/17/20 224 lb (101.6 kg)    VITALS: BP 140/80 (BP Location: Left Arm)   Pulse 81   Ht '5\' 8"'$  (1.727 m)   Wt 222 lb 12.8 oz (101.1 kg)   SpO2 98%   BMI 33.88 kg/m   EXAM: General appearance: alert and no distress Neck: no carotid bruit, no JVD and thyroid not enlarged, symmetric, no tenderness/mass/nodules Lungs: clear to auscultation bilaterally Heart: regular rate and rhythm Abdomen: soft, non-tender; bowel sounds normal; no masses,  no organomegaly Extremities: extremities normal, atraumatic, no cyanosis or edema Pulses: 2+ and symmetric Skin: Skin color, texture, turgor normal. No rashes or lesions Neurologic: Grossly normal Psych: Pleasant  EKG: Deferred  ASSESSMENT: Chest pain, ?stress-related CAD with 99% mid LAD stenosis status post DES placement (10/02/2018) for NSTEMI Hypertension Dyslipidemia - statin intolerant, myalgias/arthralgias Strong family history of premature coronary disease Migraine headaches Anxiety  PLAN: 1.   Ms. Wion gets some intermittent chest discomfort which may be stress  related.  She seems a little better on isosorbide.  She has not needed the short acting nitroglycerin.  She was doing well on Repatha but came off the medicine due to cost issues.  I encouraged her to consider trying the [employer] grant again.  Blood pressure was slightly elevated today.  Encourage more routine exercise and activity.  She has also suffered from some migraine headaches as well.  Follow-up in 3 months or sooner as necessary  Pixie Casino, MD, [facility], [facility], [facility], [place] Director of the [facility]  [facility] Diplomate of the American Board of Clinical Lipidology Attending Cardiologist  Direct Dial: [phone]  Fax: [phone]  Website:  [email or web address] 08/22/2020, 3:32 PM

## 2020-08-31 ENCOUNTER — Other Ambulatory Visit: Payer: Self-pay | Admitting: Nurse Practitioner

## 2020-08-31 DIAGNOSIS — M545 Low back pain, unspecified: Secondary | ICD-10-CM

## 2020-09-23 ENCOUNTER — Ambulatory Visit
Admission: RE | Admit: 2020-09-23 | Discharge: 2020-09-23 | Disposition: A | Discharge status: Home / Self Care | Source: Ambulatory Visit | Attending: Nurse Practitioner | Admitting: Nurse Practitioner

## 2020-09-23 ENCOUNTER — Other Ambulatory Visit: Payer: Self-pay

## 2020-09-23 DIAGNOSIS — G8929 Other chronic pain: Secondary | ICD-10-CM

## 2020-11-19 IMAGING — CR DG CHEST 2V
2 series · 2 of 2 positions shown · non-contrast
Comparison: 12/20/2015

CLINICAL DATA: Pt reports she began having nausea, chest pain and
high bp yesterday while at her neuro doctor, states she tried to see
her PCP but they cannot see her until [REDACTED]. States pain died down
last night but today it got worse again and she is having jaw pain
and R arm pain today

EXAM:
CHEST - 2 VIEW

[chest lat]
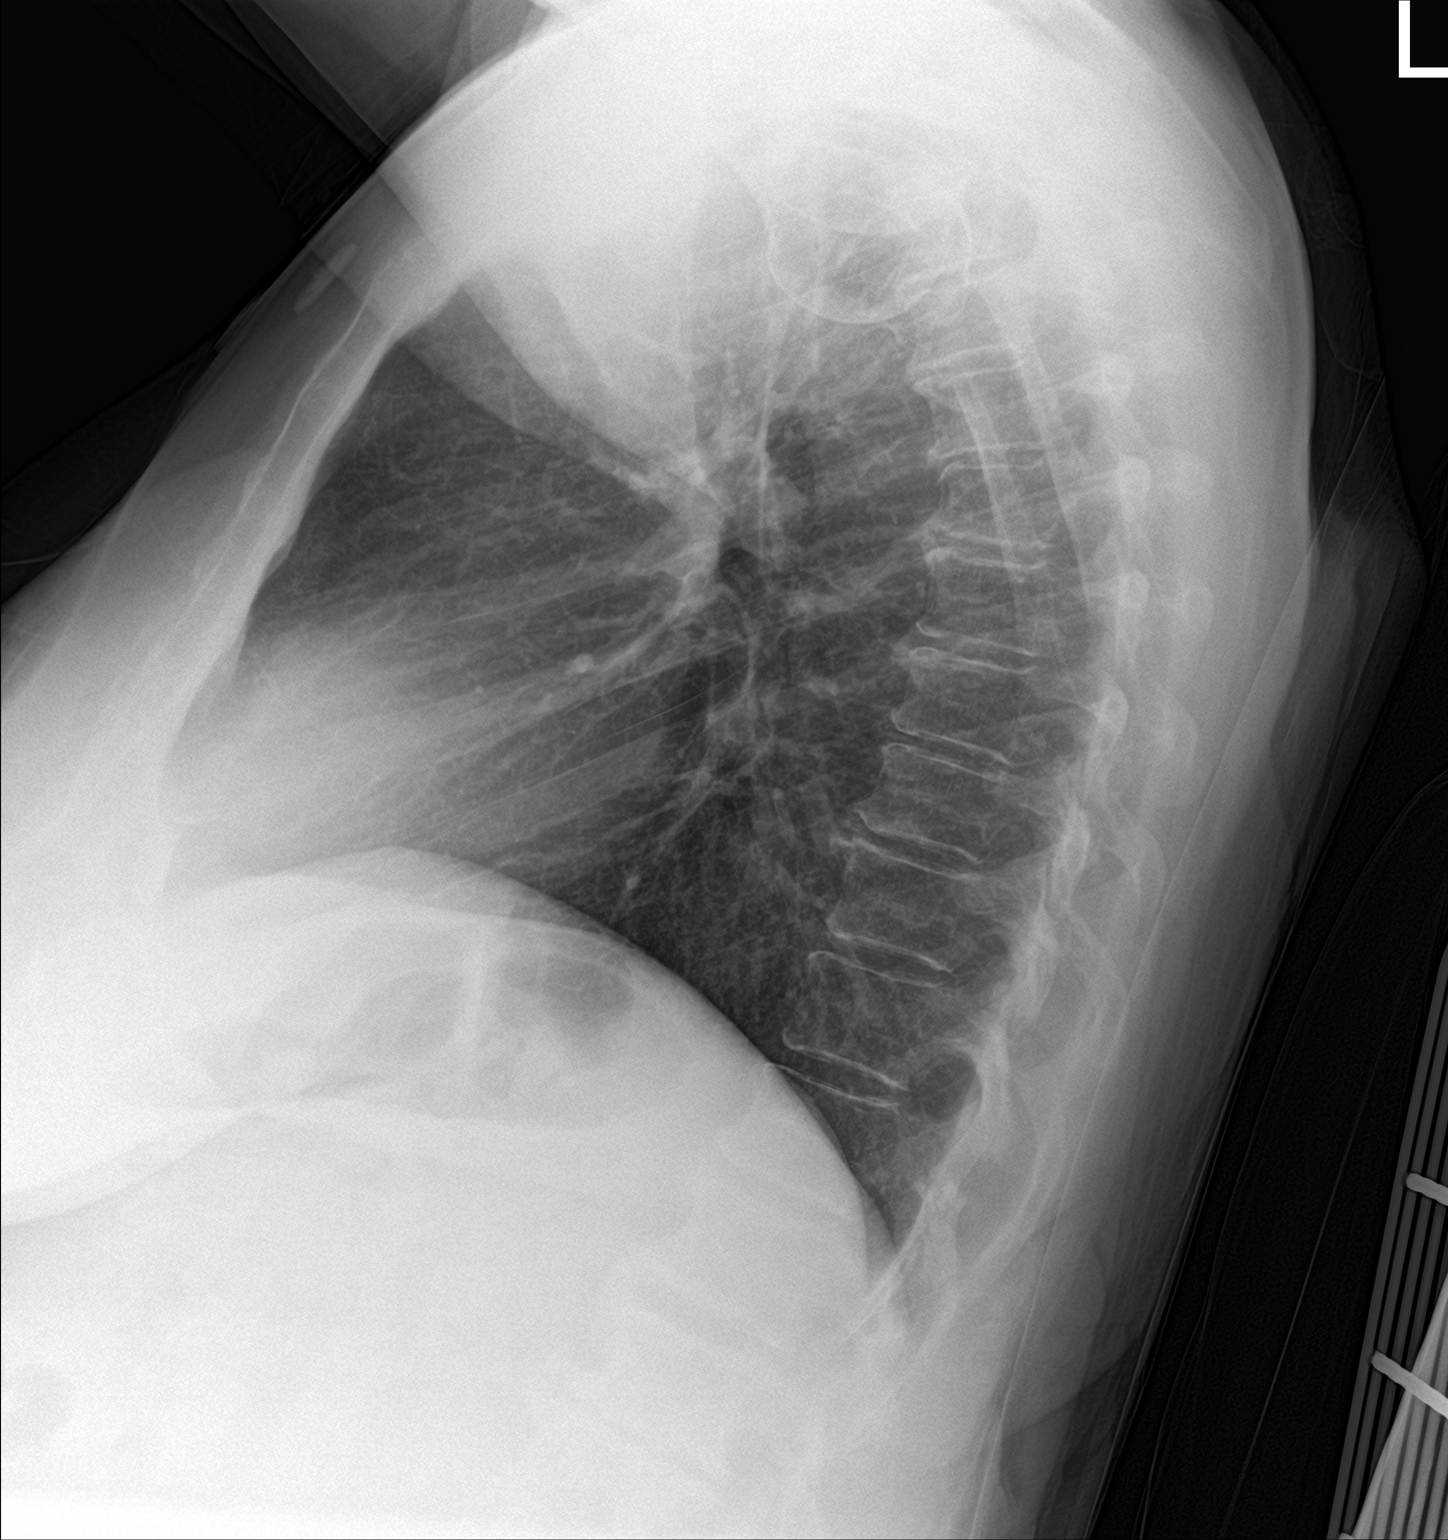

[chest ap]
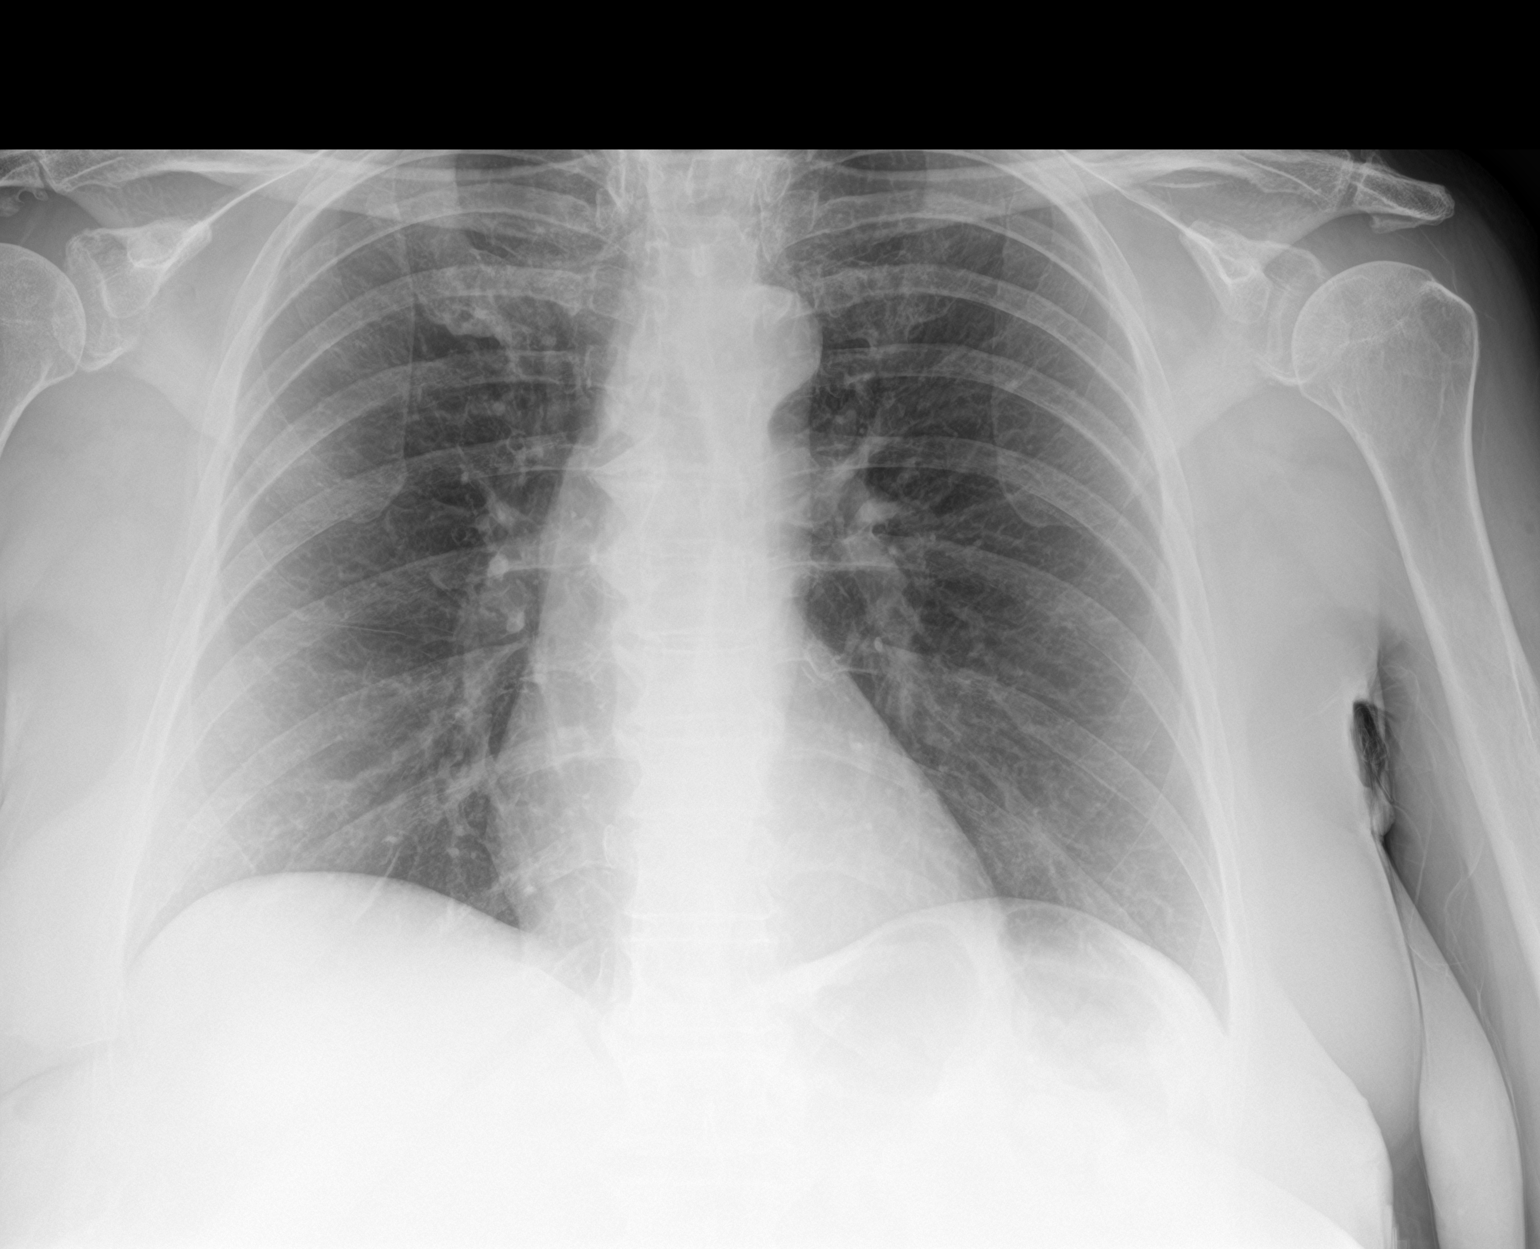

[2 of 2 positions shown; findings below may reference images not displayed]

FINDINGS: Cardiac silhouette is normal in size. No mediastinal or hilar
masses. No evidence of adenopathy.

Clear lungs.  No pleural effusion or pneumothorax.

Skeletal structures are intact.
IMPRESSION: No active cardiopulmonary disease.

## 2020-11-21 ENCOUNTER — Telehealth: Payer: Self-pay

## 2020-11-21 NOTE — Telephone Encounter (Signed)
Pt has a f/u with Dr. Debara Pickett 11/22.  Reviewed notes.  She was having some chest pain when he saw her last in Aug 2022 and plan was to f/u in 3 mos.  Please ask surgeon's office if procedure needs to be done prior to 11/22.  If not, clearance to proceed and hold ASA can be made at the OV with Dr. Debara Pickett.  Richardson Dopp, PA-C    11/21/2020 5:28 PM

## 2020-11-21 NOTE — Telephone Encounter (Signed)
   [facility] Pre-operative Risk Assessment    Patient Name: Sherri [name]  DOB: 09-Aug-1955 MRN: [id number]    Request for surgical clearance:  What type of surgery is being performed LESI  L2-L3  When is this surgery scheduled TBD  What type of clearance is required  Both  Are there any medications that need to be held prior to surgery and how long  Aspirin  Practice name and name of physician performing surgery [facility]   Dr.Dave Davy Pique  What is the office phone number  [phone]   7.   What is the office fax number  [phone]  8.   Anesthesia type  Not listed   Kathyrn Lass 11/21/2020, 9:27 AM  _________________________________________________________________   (provider comments below)

## 2020-11-22 NOTE — Telephone Encounter (Signed)
Left message for Sherri [name] with Dr. Dionne Ano office. Left message pt has appt 11/28/20 which at that time will be assessed for clearance. Pt had chest pain back in August. Pt will need to be seen before clearance can be sent. Once cleared our office will fax notes.

## 2020-11-22 NOTE — Telephone Encounter (Signed)
Left a detailed message for Janett Billow, surgery scheduler, asking her to call back and confirm if pt can get clearance at upcoming appt 11/22, or if it needs to be sooner.  Asked her to ask for the pre-op team.

## 2020-11-23 ENCOUNTER — Other Ambulatory Visit: Payer: Self-pay | Admitting: Internal Medicine

## 2020-11-28 ENCOUNTER — Ambulatory Visit (INDEPENDENT_AMBULATORY_CARE_PROVIDER_SITE_OTHER): Admitting: Internal Medicine

## 2020-11-28 ENCOUNTER — Other Ambulatory Visit: Payer: Self-pay

## 2020-11-28 ENCOUNTER — Encounter: Payer: Self-pay | Admitting: Internal Medicine

## 2020-11-28 VITALS — BP 133/76 | HR 67 | Ht 68.0 in | Wt 229.2 lb

## 2020-11-28 DIAGNOSIS — R079 Chest pain, unspecified: Secondary | ICD-10-CM | POA: Diagnosis not present

## 2020-11-28 DIAGNOSIS — M791 Myalgia, unspecified site: Secondary | ICD-10-CM | POA: Diagnosis not present

## 2020-11-28 DIAGNOSIS — Z9861 Coronary angioplasty status: Secondary | ICD-10-CM | POA: Diagnosis not present

## 2020-11-28 DIAGNOSIS — T466X5D Adverse effect of antihyperlipidemic and antiarteriosclerotic drugs, subsequent encounter: Secondary | ICD-10-CM

## 2020-11-28 DIAGNOSIS — I2510 Atherosclerotic heart disease of native coronary artery without angina pectoris: Secondary | ICD-10-CM

## 2020-11-28 DIAGNOSIS — T466X5A Adverse effect of antihyperlipidemic and antiarteriosclerotic drugs, initial encounter: Secondary | ICD-10-CM

## 2020-11-28 DIAGNOSIS — E785 Hyperlipidemia, unspecified: Secondary | ICD-10-CM | POA: Diagnosis not present

## 2020-11-28 MED ORDER — ISOSORBIDE MONONITRATE ER 30 MG PO TB24: 15.0000 mg | ORAL_TABLET | Freq: Every day | ORAL | 3 refills | Status: AC

## 2020-11-28 MED ORDER — METOPROLOL TARTRATE 50 MG PO TABS: 50.0000 mg | ORAL_TABLET | Freq: Two times a day (BID) | ORAL | 3 refills | Status: AC

## 2020-11-28 MED ORDER — EZETIMIBE 10 MG PO TABS: 10.0000 mg | ORAL_TABLET | Freq: Every day | ORAL | 3 refills | Status: AC

## 2020-11-28 MED ORDER — AMLODIPINE BESYLATE 10 MG PO TABS: 10.0000 mg | ORAL_TABLET | Freq: Every day | ORAL | 3 refills | Status: AC

## 2020-11-28 NOTE — Patient Instructions (Addendum)
Medication Instructions:  NO CHANGES  *If you need a refill on your cardiac medications before your next appointment, please call your pharmacy*  Testing/Procedures: Dr. Debara Pickett has ordered a Lexiscan Myocardial Perfusion Imaging Study. This is done at [address] [facility])  Please arrive 15 minutes prior to your appointment time for registration and insurance purposes.   The test will take approximately 3 to 4 hours to complete; you may bring reading material.  If someone comes with you to your appointment, they will need to remain in the main lobby due to limited space in the testing area. **If you are pregnant or breastfeeding, please notify the nuclear lab prior to your appointment**   How to prepare for your Myocardial Perfusion Test: Do not eat or drink 3 hours prior to your test, except you may have water. Do not consume products containing caffeine (regular or decaffeinated) 12 hours prior to your test. (ex: coffee, chocolate, sodas, tea). Do wear comfortable clothes (no dresses or overalls) and walking shoes, tennis shoes preferred (No heels or open toe shoes are allowed). Do NOT wear cologne, perfume, aftershave, or lotions (deodorant is allowed). If you use an inhaler, use it the AM of your test and bring it with you.  If you use a nebulizer, use it the AM of your test.  If these instructions are not followed, your test will have to be rescheduled.    Follow-Up: At [facility], you and your health needs are our priority.  As part of our continuing mission to provide you with exceptional heart care, we have created designated Provider Care Teams.  These Care Teams include your primary Cardiologist (physician) and Advanced Practice Providers (APPs -  Physician Assistants and Nurse Practitioners) who all work together to provide you with the care you need, when you need it.  We recommend signing up for the patient portal called "MyChart".  Sign up  information is provided on this After Visit Summary.  MyChart is used to connect with patients for Virtual Visits (Telemedicine).  Patients are able to view lab/test results, encounter notes, upcoming appointments, etc.  Non-urgent messages can be sent to your provider as well.   To learn more about what you can do with MyChart, go to [email or web address].    Your next appointment:   6 months with Dr. Debara Pickett

## 2020-11-28 NOTE — Progress Notes (Signed)
OFFICE NOTE  Chief Complaint:  Routine follow-up  Primary Care Physician: Harrison Mons, PA  HPI:  Sherri [name] is a 65 y.o. female with family history of premature CAD in mother's side, but few other cardiac risk factors - does have migraines. Has had 2 days of left/center chest pain, radiating to jaw and left arm - this was associated with headache and she was seeing her neurologist. BP is also noted to be elevated - apparently she had a diagnosis of hypertension, but has not been on medication. She does take chronic pain medication. Mild EKG changes noted - troponin minimally elevated but flat -could represent NSTEMI, but not typical for ACS. Suspect there could be CAD, but may also be coronary vasospasm given history of migraine. She underwent LHC and was found to have a 99% mid-LAD stenosis, s/p PCI with 3.5 x 12 mm and 3.0 x 18 mm DES overlapping. She tolerated this well. Has followed up with our APP's. BP was uncontrolled, however, her amlodipine was increased and it has improved.  Lipids remain above goal.  She initially was on atorvastatin but had significant joint pain with that.  It was discontinued and she was switched to pravastatin.  She says she continues to have issues with that.  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  She also had one episode where she was under significant stress and felt fluttering in her heart and discomfort she took a nitroglycerin and felt some improvement after 30 minutes.  06/16/2019  Sherri [name] returns today for follow-up.  Initially blood pressure was elevated, however came down to 138/78.  She does report some intermittent palpitations however EKG today shows sinus rhythm at 65.  She did have repeat lipids yesterday which showed total cholesterol 193, triglycerides 64, HDL 72 and LDL of 109.  Given coronary disease her target LDL is less than 70 and interestingly her HDL is 72 apparently is not cardioprotective given her known coronary  disease.  Unfortunately she could not tolerate statins.  03/17/2020  Sherri [name] returns today for follow-up.  Blood pressure slightly elevated today.  She denies any chest pain or worsening shortness of breath.  She had labs a few days ago which showed total cholesterol 166, HDL 70, LDL 82 and triglycerides 75.  She still remains just above target LDL less than 70.  She is also still on aspirin and Brilinta having been now 2 years out of her stent.  She remains on Repatha and ezetimibe.  She does get some occasional chest discomfort.  She has tried nitroglycerin with some relief.  She thinks this is due to stress at her job since she works as a Psychiatric nurse.  08/23/2020  Sherri [name] is seen today in follow-up.  She does report some occasional chest discomfort but in general it sounds like she may have had some improvement after starting long-acting nitrates.  She does report being under quite a bit of stress.  Cholesterol numbers stable reasonably well-controlled as of March 2022 with LDL 82.  Unfortunately she is not currently taking her Repatha due to cost issues.  She had applied for a grant but that may run out.  She has also had a lot of other financial stressors that have taken precedence over her medications.  11/28/2020  Sherri [name] is seen today for preprocedural visit.  She has been struggling with low back pain.  She requires some injections.  When I saw her last 3 months ago she was  having some chest pain.  This continues to occur but it seems to be mostly with exertional stress or situational episodes at work.  She noticed also she gets symptoms sometimes when carrying heavy buckets of flowers.  She is on isosorbide.  She ran out of short acting nitro.  These episodes may be again stress or blood pressure related however given her history of coronary disease, and the fact that she is already on a long-acting nitrate, this could represent some progressive coronary artery disease.    PMHx:  Past Medical History:   Diagnosis Date   Anxiety    Arthritis    Breast pain, right    Cancer ([place])    ? breast, cervical    Closed left ankle fracture 12/17/2015   Depression    situational   Hypertension    not taken atenolol or lisinopril in >72mos, no money   Migraines    Pneumonia    Ruptured disk     Past Surgical History:  Procedure Laterality Date   AUGMENTATION MAMMAPLASTY Right    1979   BREAST SURGERY     lumpectomy in 20s for benign mass with implant placement (subglandular)   CARPAL TUNNEL RELEASE     right wrist   CARPAL TUNNEL RELEASE Right 09/02/2014   Procedure: RIGHT CARPAL TUNNEL RELEASE;  Surgeon: Melrose Nakayama, MD;  Location: [facility];  Service: Orthopedics;  Laterality: Right;   CERVICAL CONE BIOPSY     COLONOSCOPY     CORONARY STENT INTERVENTION N/A 10/02/2018   Procedure: CORONARY STENT INTERVENTION;  Surgeon: Nelva Bush, MD;  Location: [facility] [facility];  Service: Cardiovascular;  Laterality: N/A;   KNEE ARTHROSCOPY Left 07/1996   KNEE ARTHROSCOPY Right 1998   LEFT HEART CATH AND CORONARY ANGIOGRAPHY N/A 10/02/2018   Procedure: LEFT HEART CATH AND CORONARY ANGIOGRAPHY;  Surgeon: Nelva Bush, MD;  Location: [facility];  Service: Cardiovascular;  Laterality: N/A;   ORIF ANKLE FRACTURE Left 12/17/2015   Procedure: OPEN REDUCTION INTERNAL FIXATION (ORIF) ANKLE FRACTURE;  Surgeon: Milly Jakob, MD;  Location: [facility];  Service: Orthopedics;  Laterality: Left;   SPINE SURGERY  72011   L2-L5 stabilization   TOTAL HIP ARTHROPLASTY Right 10/15/2016   TOTAL HIP ARTHROPLASTY Right 10/15/2016   Procedure: TOTAL HIP ARTHROPLASTY ANTERIOR APPROACH;  Surgeon: Melrose Nakayama, MD;  Location: [facility];  Service: Orthopedics;  Laterality: Right;    FAMHx:  Family History  Problem Relation Age of Onset   Diabetes Mother    Glaucoma Mother    Cancer Mother        breast, ovarian   Heart disease Mother    Breast cancer Mother 9   Migraines Mother     Gout Father    Cancer Sister        ovarian, uterine cancer   Migraines Sister    Migraines Brother    Migraines Maternal Grandmother     SOCHx:   reports that she quit smoking about 44 years ago. Her smoking use included cigarettes. She has never used smokeless tobacco. She reports that she does not drink alcohol and does not use drugs.  ALLERGIES:  Allergies  Allergen Reactions   Effexor [Venlafaxine Hydrochloride] Shortness Of Breath, Rash and Other (See Comments)    HALLUCINATIONS   Iodine Anaphylaxis   Shellfish Allergy Anaphylaxis and Other (See Comments)   Codeine Swelling and Rash    SWELLING REACTION UNSPECIFIED    Flexeril [Cyclobenzaprine Hcl] Other (See Comments)  HALLUCINATIONS AND "OUT OF CONTROL"   Hydromorphone Other (See Comments)    HALLUCINATIONS   Nubain [Nalbuphine Hcl] Other (See Comments)    HALLUCINATIONS   Atorvastatin Other (See Comments)    Severe joint pain   Pravastatin     Joint pain, myalgias   Erythromycin Rash    ROS: Pertinent items noted in HPI and remainder of comprehensive ROS otherwise negative.  HOME MEDS: Current Outpatient Medications on File Prior to Visit  Medication Sig Dispense Refill   amLODipine (NORVASC) 10 MG tablet TAKE ONE TABLET BY MOUTH ONE TIME DAILY  30 tablet 7   aspirin EC 81 MG tablet Take 81 mg by mouth daily.     buPROPion (WELLBUTRIN XL) 150 MG 24 hr tablet Take 1 tablet (150 mg total) by mouth daily. 90 tablet 3   busPIRone (BUSPAR) 15 MG tablet TAKE ONE TABLET BY MOUTH TWICE DAILY     Butalbital-APAP-Caffeine 50-325-40 MG capsule Take 1 capsule by mouth every 6 (six) hours as needed for headache.      docusate sodium (COLACE) 100 MG capsule Take 1 capsule (100 mg total) by mouth 2 (two) times daily. 30 capsule 0   Erenumab-aooe (AIMOVIG [place]) Inject into the skin every 30 (thirty) days. Unknown dose Given at Neurologist     Evolocumab (REPATHA SURECLICK) 416 MG/ML SOAJ Inject 1 Dose into the skin every 14  (fourteen) days. 2 mL 3   isosorbide mononitrate (IMDUR) 30 MG 24 hr tablet Take one-half tablet by mouth daily 45 tablet 0   metaxalone (SKELAXIN) 800 MG tablet Take 1 tablet (800 mg total) by mouth 3 (three) times daily as needed for pain. 90 tablet 3   metoprolol tartrate (LOPRESSOR) 50 MG tablet TAKE ONE TABLET BY MOUTH TWICE DAILY **NEEDS APPOINTMENT FOR FUTURE REFILLS** 60 tablet 0   Multiple Vitamin tablet      nitroGLYCERIN (NITROSTAT) 0.4 MG SL tablet Place 1 tablet (0.4 mg total) under the tongue every 5 (five) minutes as needed for chest pain. 15 tablet 0   oxyCODONE-acetaminophen (PERCOCET) 10-325 MG tablet Take 1-2 tablets by mouth every 4 (four) hours as needed for pain. 40 tablet 0   valACYclovir (VALTREX) 500 MG tablet Take 1 PO QD for suppression; INCREASE to BID x 3 days PRN outbreak (Patient taking differently: Take 500 mg by mouth daily.) 100 tablet 3   ezetimibe (ZETIA) 10 MG tablet Take 1 tablet (10 mg total) by mouth daily. 90 tablet 3   No current facility-administered medications on file prior to visit.    LABS/IMAGING: No results found for this or any previous visit (from the past 48 hour(s)). No results found.  LIPID PANEL:    Component Value Date/Time   CHOL 166 03/14/2020 1019   TRIG 75 03/14/2020 1019   HDL 70 03/14/2020 1019   CHOLHDL 2.4 03/14/2020 1019   CHOLHDL 3.0 10/02/2018 0521   VLDL 7 10/02/2018 0521   LDLCALC 82 03/14/2020 1019     WEIGHTS: Wt Readings from Last 3 Encounters:  11/28/20 229 lb 3.2 oz (104 kg)  08/22/20 222 lb 12.8 oz (101.1 kg)  07/12/20 227 lb (103 kg)    VITALS: BP 133/76   Pulse 67   Ht 5\' 8"  (1.727 m)   Wt 229 lb 3.2 oz (104 kg)   SpO2 98%   BMI 34.85 kg/m   EXAM: General appearance: alert and no distress Neck: no carotid bruit, no JVD and thyroid not enlarged, symmetric, no tenderness/mass/nodules Lungs: clear to  auscultation bilaterally Heart: regular rate and rhythm Abdomen: soft, non-tender; bowel sounds  normal; no masses,  no organomegaly Extremities: extremities normal, atraumatic, no cyanosis or edema Pulses: 2+ and symmetric Skin: Skin color, texture, turgor normal. No rashes or lesions Neurologic: Grossly normal Psych: Pleasant  EKG: Normal sinus rhythm at 67-personally reviewed  ASSESSMENT: Chest pain, ?stress-related CAD with 99% mid LAD stenosis status post DES placement (10/02/2018) for NSTEMI Hypertension Dyslipidemia - statin intolerant, myalgias/arthralgias Strong family history of premature coronary disease Migraine headaches Anxiety  PLAN: 1.   Ms. Aro continues to have episodes of chest pain which are sporadic and seem to be associated with work stress but sometimes worse when carrying heavy buckets of flowers.  She does have coronary disease and has been on a long-acting nitrate but is having symptoms despite that.  This could represent some progressive coronary disease.  I recommend a Lexiscan Myoview.  If this is negative then she could proceed with her spinal injections.  She is on aspirin and if her Myoview is low risk then would be okay to stop it for up to a week prior to those injections.  Follow-up with me in 6 months.  Pixie Casino, MD, [facility], [place] Director of the Advanced Lipid Disorders &  Cardiovascular Risk Reduction Clinic Diplomate of the American Board of Clinical Lipidology Attending Cardiologist  Direct Dial: [phone]  Fax: [phone]  Website:  [email or web address] 11/28/2020, 10:44 AM

## 2020-12-07 ENCOUNTER — Telehealth (HOSPITAL_COMMUNITY): Payer: Self-pay | Admitting: Internal Medicine

## 2020-12-07 NOTE — Telephone Encounter (Signed)
Patient called and cancelled Myoview for reason below:  12/07/2020 2:59 PM [id number], LESLIE O  Cancel Rsn: Patient (pt can not be out of work right now she will callback in 2023 to r/s)  Order will be removed from the [facility] wq and when pt calls back to reschedule we will reinstate the order.

## 2020-12-19 ENCOUNTER — Encounter (HOSPITAL_COMMUNITY)

## 2021-12-25 ENCOUNTER — Emergency Department (HOSPITAL_BASED_OUTPATIENT_CLINIC_OR_DEPARTMENT_OTHER)
Admission: EM | Admit: 2021-12-25 | Discharge: 2021-12-26 | Disposition: A | Discharge status: Home / Self Care | Payer: Self-pay | Attending: Emergency Medicine | Admitting: Emergency Medicine

## 2021-12-25 ENCOUNTER — Emergency Department (HOSPITAL_BASED_OUTPATIENT_CLINIC_OR_DEPARTMENT_OTHER): Payer: Self-pay

## 2021-12-25 ENCOUNTER — Other Ambulatory Visit: Payer: Self-pay

## 2021-12-25 ENCOUNTER — Encounter (HOSPITAL_BASED_OUTPATIENT_CLINIC_OR_DEPARTMENT_OTHER): Payer: Self-pay | Admitting: Emergency Medicine

## 2021-12-25 DIAGNOSIS — I10 Essential (primary) hypertension: Secondary | ICD-10-CM | POA: Insufficient documentation

## 2021-12-25 DIAGNOSIS — K2901 Acute gastritis with bleeding: Secondary | ICD-10-CM | POA: Insufficient documentation

## 2021-12-25 DIAGNOSIS — Z7982 Long term (current) use of aspirin: Secondary | ICD-10-CM | POA: Insufficient documentation

## 2021-12-25 DIAGNOSIS — I2510 Atherosclerotic heart disease of native coronary artery without angina pectoris: Secondary | ICD-10-CM | POA: Insufficient documentation

## 2021-12-25 DIAGNOSIS — K922 Gastrointestinal hemorrhage, unspecified: Secondary | ICD-10-CM

## 2021-12-25 LAB — URINALYSIS, ROUTINE W REFLEX MICROSCOPIC
Bilirubin Urine: NEGATIVE
Glucose, UA: NEGATIVE mg/dL
Hgb urine dipstick: NEGATIVE
Ketones, ur: 15 mg/dL — AB
Nitrite: NEGATIVE
Protein, ur: NEGATIVE mg/dL
Specific Gravity, Urine: 1.021 (ref 1.005–1.030)
pH: 5.5 (ref 5.0–8.0)

## 2021-12-25 LAB — CBC
HCT: 32.4 % — ABNORMAL LOW (ref 36.0–46.0)
Hemoglobin: 10.2 g/dL — ABNORMAL LOW (ref 12.0–15.0)
MCH: 25.8 pg — ABNORMAL LOW (ref 26.0–34.0)
MCHC: 31.5 g/dL (ref 30.0–36.0)
MCV: 81.8 fL (ref 80.0–100.0)
Platelets: 483 10*3/uL — ABNORMAL HIGH (ref 150–400)
RBC: 3.96 MIL/uL (ref 3.87–5.11)
RDW: 13.9 % (ref 11.5–15.5)
WBC: 9.7 10*3/uL (ref 4.0–10.5)
nRBC: 0 % (ref 0.0–0.2)

## 2021-12-25 LAB — COMPREHENSIVE METABOLIC PANEL
ALT: 9 U/L (ref 0–44)
AST: 10 U/L — ABNORMAL LOW (ref 15–41)
Albumin: 4.2 g/dL (ref 3.5–5.0)
Alkaline Phosphatase: 48 U/L (ref 38–126)
Anion gap: 9 (ref 5–15)
BUN: 21 mg/dL (ref 8–23)
CO2: 26 mmol/L (ref 22–32)
Calcium: 9.2 mg/dL (ref 8.9–10.3)
Chloride: 101 mmol/L (ref 98–111)
Creatinine, Ser: 0.68 mg/dL (ref 0.44–1.00)
GFR, Estimated: >60 mL/min (ref >60)
Glucose, Bld: 104 mg/dL — ABNORMAL HIGH (ref 70–99)
Potassium: 4 mmol/L (ref 3.5–5.1)
Sodium: 136 mmol/L (ref 135–145)
Total Bilirubin: 0.2 mg/dL — ABNORMAL LOW (ref 0.3–1.2)
Total Protein: 7.1 g/dL (ref 6.5–8.1)

## 2021-12-25 LAB — OCCULT BLOOD X 1 CARD TO LAB, STOOL: Fecal Occult Bld: POSITIVE — AB

## 2021-12-25 LAB — LIPASE, BLOOD: Lipase: 30 U/L (ref 11–51)

## 2021-12-25 MED ORDER — PANTOPRAZOLE SODIUM 40 MG PO TBEC: 40.0000 mg | DELAYED_RELEASE_TABLET | Freq: Every day | ORAL | 0 refills | Status: AC

## 2021-12-25 MED ORDER — ONDANSETRON HCL 4 MG/2ML IJ SOLN
4.0000 mg | Freq: Once | INTRAMUSCULAR | Status: AC
  Administered 2021-12-25: 4 mg via INTRAVENOUS
  Filled 2021-12-25: qty 2

## 2021-12-25 MED ORDER — ONDANSETRON HCL 4 MG PO TABS: 4.0000 mg | ORAL_TABLET | Freq: Four times a day (QID) | ORAL | 0 refills | Status: AC

## 2021-12-25 MED ORDER — PANTOPRAZOLE SODIUM 40 MG IV SOLR
40.0000 mg | Freq: Once | INTRAVENOUS | Status: AC
  Administered 2021-12-25: 40 mg via INTRAVENOUS
  Filled 2021-12-25: qty 10

## 2021-12-25 NOTE — ED Provider Notes (Signed)
[facility] EMERGENC[facility] DEPT Provider Note   CSN: [id number] Arrival date & time: 12/25/21  1636     History  Chief Complaint  Patient presents with   Abdominal Pain    Sherri [name] is a 66 y.o. female.  Patient is a 66 year old female with a past medical history of CAD on aspirin, hypertension presenting to the emergency department with abdominal pain, nausea and vomiting.  Patient states that she has had a diffuse abdominal pain, worse in her epigastrium with nausea and vomiting over the last few days.  She states that it initially started as a yellow-green vomitus but in the last day has started to appear more black.  She denies any diarrhea or constipation, black or bloody stools.  She denies any fevers or chills.  He denies any recent NSAID use, caffeine, alcohol or tobacco use.  The history is provided by the patient.  Abdominal Pain      Home Medications Prior to Admission medications   Medication Sig Start Date End Date Taking? Authorizing Provider  ondansetron (ZOFRAN) 4 MG tablet Take 1 tablet (4 mg total) by mouth every 6 (six) hours. 12/25/21  Yes Maylon Peppers, [place], DO  pantoprazole (PROTONIX) 40 MG tablet Take 1 tablet (40 mg total) by mouth daily. 12/25/21  Yes Maylon Peppers, [place] K, DO  amLODipine (NORVASC) 10 MG tablet Take 1 tablet (10 mg total) by mouth daily. 11/28/20   Pixie Casino, MD  aspirin EC 81 MG tablet Take 81 mg by mouth daily.    [provider]  buPROPion (WELLBUTRIN XL) 150 MG 24 hr tablet Take 1 tablet (150 mg total) by mouth daily. 05/06/17   Harrison Mons, PA  busPIRone (BUSPAR) 15 MG tablet TAKE ONE TABLET BY MOUTH TWICE DAILY 03/05/19   [provider]  Butalbital-APAP-Caffeine [phone] MG capsule Take 1 capsule by mouth every 6 (six) hours as needed for headache.  05/12/17   [provider]  docusate sodium (COLACE) 100 MG capsule Take 1 capsule (100 mg total) by mouth 2 (two) times daily.  10/18/16   Loni Dolly, PA-C  Erenumab-aooe (AIMOVIG [place]) Inject into the skin every 30 (thirty) days. Unknown dose Given at Neurologist    [provider]  Evolocumab (REPATHA SURECLICK) 382 MG/ML SOAJ Inject 1 Dose into the skin every 14 (fourteen) days. 01/14/20   Hilty, Nadean Corwin, MD  ezetimibe (ZETIA) 10 MG tablet Take 1 tablet (10 mg total) by mouth daily. 11/28/20 02/26/21  Pixie Casino, MD  isosorbide mononitrate (IMDUR) 30 MG 24 hr tablet Take 0.5 tablets (15 mg total) by mouth daily. 11/28/20   Hilty, Nadean Corwin, MD  metaxalone (SKELAXIN) 800 MG tablet Take 1 tablet (800 mg total) by mouth 3 (three) times daily as needed for pain. 02/27/12   Harrison Mons, PA  metoprolol tartrate (LOPRESSOR) 50 MG tablet Take 1 tablet (50 mg total) by mouth 2 (two) times daily. 11/28/20   Pixie Casino, MD  Multiple Vitamin tablet     [provider]  nitroGLYCERIN (NITROSTAT) 0.4 MG SL tablet Place 1 tablet (0.4 mg total) under the tongue every 5 (five) minutes as needed for chest pain. 10/03/18   Elodia Florence., MD  oxyCODONE-acetaminophen (PERCOCET) 10-325 MG tablet Take 1-2 tablets by mouth every 4 (four) hours as needed for pain. 10/18/16   Loni Dolly, PA-C  valACYclovir (VALTREX) 500 MG tablet Take 1 PO QD for suppression; INCREASE to BID x 3 days PRN outbreak Patient taking  differently: Take 500 mg by mouth daily. 05/06/17   Harrison Mons, PA      Allergies    Effexor [venlafaxine hydrochloride], Iodine, Shellfish allergy, Codeine, Flexeril [cyclobenzaprine hcl], Hydromorphone, Nubain [nalbuphine hcl], Atorvastatin, Pravastatin, and Erythromycin    Review of Systems   Review of Systems  Gastrointestinal:  Positive for abdominal pain.    Physical Exam Updated Vital Signs BP (!) 149/65   Pulse 94   Temp 97.9 F (36.6 C)   Resp 13   Ht '5\' 8"'$  (1.727 m)   Wt 99.8 kg   SpO2 100%   BMI 33.45 kg/m  Physical Exam Vitals and nursing note reviewed.   Constitutional:      General: She is not in acute distress.    Appearance: She is well-developed.  HENT:     Head: Normocephalic and atraumatic.     Mouth/Throat:     Mouth: Mucous membranes are moist.     Pharynx: Oropharynx is clear.  Eyes:     Extraocular Movements: Extraocular movements intact.  Cardiovascular:     Rate and Rhythm: Normal rate and regular rhythm.     Heart sounds: Normal heart sounds.  Pulmonary:     Effort: Pulmonary effort is normal.     Breath sounds: Normal breath sounds.  Abdominal:     General: Abdomen is flat.     Palpations: Abdomen is soft.     Tenderness: There is generalized abdominal tenderness and tenderness in the epigastric area. There is no guarding or rebound.  Genitourinary:    Rectum: Normal. Guaiac result positive (brown stool).  Skin:    General: Skin is warm and dry.  Neurological:     Mental Status: She is alert.     ED Results / Procedures / Treatments   Labs (all labs ordered are listed, but only abnormal results are displayed) Labs Reviewed  COMPREHENSIVE METABOLIC PANEL - Abnormal; Notable for the following components:      Result Value   Glucose, Bld 104 (*)    AST 10 (*)    Total Bilirubin 0.2 (*)    All other components within normal limits  CBC - Abnormal; Notable for the following components:   Hemoglobin 10.2 (*)    HCT 32.4 (*)    MCH 25.8 (*)    Platelets 483 (*)    All other components within normal limits  URINALYSIS, ROUTINE W REFLEX MICROSCOPIC - Abnormal; Notable for the following components:   Ketones, ur 15 (*)    Leukocytes,Ua MODERATE (*)    Bacteria, UA FEW (*)    Non Squamous Epithelial 0-5 (*)    All other components within normal limits  OCCULT BLOOD X 1 CARD TO LAB, STOOL - Abnormal; Notable for the following components:   Fecal Occult Bld POSITIVE (*)    All other components within normal limits  LIPASE, BLOOD    EKG EKG Interpretation  Date/Time:  Tuesday December 25 2021 17:13:30  EST Ventricular Rate:  81 PR Interval:  166 QRS Duration: 80 QT Interval:  374 QTC Calculation: 434 R Axis:   28 Text Interpretation: Unusual P axis, possible ectopic atrial rhythm Cannot rule out Anterior infarct (cited on or before 12-Jul-2020) Abnormal ECG When compared with ECG of 12-Jul-2020 13:58, Ectopic atrial rhythm has replaced Sinus rhythm Nonspecific T wave abnormality, improved in Lateral leads No significant change since last tracing Confirmed by Leanord Asal ([id number]) on 12/25/2021 9:11:33 PM  Radiology CT ABDOMEN PELVIS WO CONTRAST  Result Date: 12/25/2021  CLINICAL DATA:  Abdominal pain, acute, nonlocalized EXAM: CT ABDOMEN AND PELVIS WITHOUT CONTRAST TECHNIQUE: Multidetector CT imaging of the abdomen and pelvis was performed following the standard protocol without IV contrast. RADIATION DOSE REDUCTION: This exam was performed according to the departmental dose-optimization program which includes automated exposure control, adjustment of the mA and/or kV according to patient size and/or use of iterative reconstruction technique. COMPARISON:  None Available. FINDINGS: Lower chest: Question right breast mass and nodularity. Hepatobiliary: No focal liver abnormality. No gallstones, gallbladder wall thickening, or pericholecystic fluid. No biliary dilatation. Pancreas: No focal lesion. Normal pancreatic contour. No surrounding inflammatory changes. No main pancreatic ductal dilatation. Spleen: Normal in size without focal abnormality. Adrenals/Urinary Tract: No adrenal nodule bilaterally. No nephrolithiasis and no hydronephrosis. Slightly heterogeneous 1.5 cm fat density lesion within the left kidney. No ureterolithiasis or hydroureter. The urinary bladder is unremarkable. Stomach/Bowel: Gastric antrum bowel wall thickening with perigastric fat stranding. No evidence of bowel wall thickening or dilatation. Appendix appears normal. Vascular/Lymphatic: No abdominal aorta or iliac aneurysm.  Mild atherosclerotic plaque of the aorta and its branches. Prominent but nonenlarged perigastric lymph nodes. No abdominal, pelvic, or inguinal lymphadenopathy. Reproductive: Coarsely calcified uterine lesions consistent with degenerative uterine fibroids. Otherwise uterus and bilateral adnexa are unremarkable. Other: No intraperitoneal free fluid. No intraperitoneal free gas. No organized fluid collection. Musculoskeletal: No abdominal wall hernia or abnormality. No suspicious lytic or blastic osseous lesions. No acute displaced fracture. Multilevel degenerative changes of the spine. Total right hip arthroplasty. IMPRESSION: 1. Gastric antrum bowel wall thickening with perigastric fat stranding. Prominent but nonenlarged perigastric lymph nodes. No bowel perforation. Finding may represent gastritis versus underlying mass lesion. Recommend direct visualization. 2. 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 3.  Aortic Atherosclerosis (ICD10-I70.0). 4. Partially visualized questioned right breast mass and nodularity. Correlate with physical exam and consider diagnostic mammography if clinically indicated. Electronically Signed   By: Iven Finn M.D.   On: 12/25/2021 22:22    Procedures Procedures    Medications Ordered in ED Medications  ondansetron [facility]) injection 4 mg (4 mg Intravenous Given 12/25/21 2132)  pantoprazole (PROTONIX) injection 40 mg (40 mg Intravenous Given 12/25/21 2132)    ED Course/ Medical Decision Making/ A&P Clinical Course as of 12/25/21 2333  Tue Dec 25, 2021  2255 Hemoccult positive brown stool and CTAP concerning for gastritis vs gastric mass. GI will be consulted for recommendations on inpatient vs outpatient work up. [VK]  2316 I spoke with Dr. Paulita Fujita of GI who recommends outpatient follow up and starting PPI. Patient is agreeable with the plan. [VK]    Clinical Course User Index [VK] Kemper Durie, DO                           Medical Decision Making This patient presents to the ED with chief complaint(s) of abdominal pain, N/V with pertinent past medical history of CAD on aspirin, HTN which further complicates the presenting complaint. The complaint involves an extensive differential diagnosis and also carries with it a high risk of complications and morbidity.    The differential diagnosis includes anemia, GIB, gastritis, GERD, pancreatitis, hepatitis, cholelithiasis, cholecystitis, viral syndrome, PUD  Additional history obtained: Additional history obtained from N/A Records reviewed N/A  ED Course and Reassessment: Patient was initially evaluated by triage and had labs and EKG performed that showed mildly worsening anemia with a hemoglobin of 10 from her  baseline around 12.  Hemoccult will be performed.  The patient does have significant abdominal tenderness and CT of abdomen and pelvis will be performed.  She will be given Zofran and Protonix with concern for possible GI bleed.  Independent labs interpretation:  The following labs were independently interpreted: Mild anemia otherwise within normal range  Independent visualization of imaging: - I independently visualized the following imaging with scope of interpretation limited to determining acute life threatening conditions related to emergency care: CT AP, which revealed gastritis  Consultation: - Consulted or discussed management/test interpretation w/ external professional: GI  Consideration for admission or further workup: Considered admission for hemoglobin trending and symptomatic management, however using shared decision making with the patient, she would prefer outpatient follow-up Social Determinants of health: N/A    Amount and/or Complexity of Data Reviewed Labs: ordered. Radiology: ordered.  Risk Prescription drug management.           Final Clinical Impression(s) / ED Diagnoses Final  diagnoses:  Gastrointestinal hemorrhage, unspecified gastrointestinal hemorrhage type  Acute gastritis with hemorrhage, unspecified gastritis type    Rx / DC Orders ED Discharge Orders          Ordered    pantoprazole (PROTONIX) 40 MG tablet  Daily        12/25/21 2332    ondansetron (ZOFRAN) 4 MG tablet  Every 6 hours        12/25/21 2332              Kemper Durie, DO 12/25/21 2333

## 2021-12-25 NOTE — ED Triage Notes (Signed)
Pt [facility] from [facility] with abdominal pain since yesterday. Pt reports that she has had black emesis. Denies diarrhea, states she doesn't know if her stools have had blood or not. Pt alert & oriented, nad noted.

## 2021-12-25 NOTE — Discharge Instructions (Signed)
You were seen in the emergency department for your abdominal pain and vomiting.  You do have signs of inflammation of your stomach called gastritis that is likely causing your symptoms.  You also had mild anemia and signs they have some bleeding likely within your stomach.  Have given you an antacid and you should take this daily as well as Zofran that you can take as needed for your nausea.  You should follow-up with GI to have your symptoms rechecked and you will likely need a scope to look for your cause of bleeding.  You should return to the emergency department if you are having repetitive vomiting despite the nausea medication, you are vomiting large amounts of blood, you pass out or if you have any other new or concerning symptoms.

## 2022-12-12 ENCOUNTER — Emergency Department (HOSPITAL_BASED_OUTPATIENT_CLINIC_OR_DEPARTMENT_OTHER): Payer: Commercial Managed Care - HMO

## 2022-12-12 ENCOUNTER — Emergency Department (HOSPITAL_BASED_OUTPATIENT_CLINIC_OR_DEPARTMENT_OTHER)
Admission: EM | Admit: 2022-12-12 | Discharge: 2022-12-12 | Disposition: A | Discharge status: Home / Self Care | Payer: Commercial Managed Care - HMO | Attending: Emergency Medicine | Admitting: Emergency Medicine

## 2022-12-12 ENCOUNTER — Emergency Department (HOSPITAL_BASED_OUTPATIENT_CLINIC_OR_DEPARTMENT_OTHER): Payer: Commercial Managed Care - HMO | Admitting: Radiology

## 2022-12-12 ENCOUNTER — Encounter (HOSPITAL_BASED_OUTPATIENT_CLINIC_OR_DEPARTMENT_OTHER): Payer: Self-pay | Admitting: Emergency Medicine

## 2022-12-12 ENCOUNTER — Other Ambulatory Visit: Payer: Self-pay

## 2022-12-12 DIAGNOSIS — I2510 Atherosclerotic heart disease of native coronary artery without angina pectoris: Secondary | ICD-10-CM | POA: Insufficient documentation

## 2022-12-12 DIAGNOSIS — W19XXXA Unspecified fall, initial encounter: Secondary | ICD-10-CM | POA: Diagnosis not present

## 2022-12-12 DIAGNOSIS — S0990XA Unspecified injury of head, initial encounter: Secondary | ICD-10-CM | POA: Diagnosis not present

## 2022-12-12 DIAGNOSIS — R42 Dizziness and giddiness: Secondary | ICD-10-CM | POA: Diagnosis not present

## 2022-12-12 DIAGNOSIS — R079 Chest pain, unspecified: Secondary | ICD-10-CM | POA: Diagnosis present

## 2022-12-12 DIAGNOSIS — D649 Anemia, unspecified: Secondary | ICD-10-CM

## 2022-12-12 DIAGNOSIS — Z79899 Other long term (current) drug therapy: Secondary | ICD-10-CM | POA: Diagnosis not present

## 2022-12-12 DIAGNOSIS — I10 Essential (primary) hypertension: Secondary | ICD-10-CM | POA: Diagnosis not present

## 2022-12-12 DIAGNOSIS — N179 Acute kidney failure, unspecified: Secondary | ICD-10-CM | POA: Diagnosis not present

## 2022-12-12 DIAGNOSIS — E860 Dehydration: Secondary | ICD-10-CM

## 2022-12-12 DIAGNOSIS — Z7982 Long term (current) use of aspirin: Secondary | ICD-10-CM | POA: Insufficient documentation

## 2022-12-12 LAB — BASIC METABOLIC PANEL
Anion gap: 9 (ref 5–15)
BUN: 26 mg/dL — ABNORMAL HIGH (ref 8–23)
CO2: 27 mmol/L (ref 22–32)
Calcium: 9.8 mg/dL (ref 8.9–10.3)
Chloride: 105 mmol/L (ref 98–111)
Creatinine, Ser: 1.46 mg/dL — ABNORMAL HIGH (ref 0.44–1.00)
GFR, Estimated: 39 mL/min — ABNORMAL LOW (ref >60)
Glucose, Bld: 120 mg/dL — ABNORMAL HIGH (ref 70–99)
Potassium: 4.8 mmol/L (ref 3.5–5.1)
Sodium: 141 mmol/L (ref 135–145)

## 2022-12-12 LAB — CBC
HCT: 29.8 % — ABNORMAL LOW (ref 36.0–46.0)
Hemoglobin: 8.8 g/dL — ABNORMAL LOW (ref 12.0–15.0)
MCH: 21.4 pg — ABNORMAL LOW (ref 26.0–34.0)
MCHC: 29.5 g/dL — ABNORMAL LOW (ref 30.0–36.0)
MCV: 72.3 fL — ABNORMAL LOW (ref 80.0–100.0)
Platelets: 297 10*3/uL (ref 150–400)
RBC: 4.12 MIL/uL (ref 3.87–5.11)
RDW: 17.1 % — ABNORMAL HIGH (ref 11.5–15.5)
WBC: 6.3 10*3/uL (ref 4.0–10.5)
nRBC: 0 % (ref 0.0–0.2)

## 2022-12-12 LAB — OCCULT BLOOD X 1 CARD TO LAB, STOOL: Fecal Occult Bld: NEGATIVE

## 2022-12-12 LAB — TROPONIN I (HIGH SENSITIVITY)
Troponin I (High Sensitivity): 3 ng/L (ref <18)
Troponin I (High Sensitivity): 3 ng/L (ref <18)

## 2022-12-12 MED ORDER — MORPHINE SULFATE (PF) 4 MG/ML IV SOLN
4.0000 mg | Freq: Once | INTRAVENOUS | Status: AC
  Administered 2022-12-12: 4 mg via INTRAVENOUS
  Filled 2022-12-12: qty 1

## 2022-12-12 MED ORDER — DIPHENHYDRAMINE HCL 50 MG/ML IJ SOLN
25.0000 mg | Freq: Once | INTRAMUSCULAR | Status: AC
  Administered 2022-12-12: 25 mg via INTRAVENOUS
  Filled 2022-12-12: qty 1

## 2022-12-12 MED ORDER — PROCHLORPERAZINE EDISYLATE 10 MG/2ML IJ SOLN
10.0000 mg | Freq: Once | INTRAMUSCULAR | Status: AC
  Administered 2022-12-12: 10 mg via INTRAVENOUS
  Filled 2022-12-12: qty 2

## 2022-12-12 MED ORDER — SODIUM CHLORIDE 0.9 % IV BOLUS
1000.0000 mL | Freq: Once | INTRAVENOUS | Status: AC
  Administered 2022-12-12: 1000 mL via INTRAVENOUS

## 2022-12-12 MED ORDER — MAGNESIUM SULFATE 2 GM/50ML IV SOLN
2.0000 g | Freq: Once | INTRAVENOUS | Status: AC
  Administered 2022-12-12: 2 g via INTRAVENOUS
  Filled 2022-12-12: qty 50

## 2022-12-12 NOTE — ED Notes (Signed)
ED Provider at bedside. 

## 2022-12-12 NOTE — ED Triage Notes (Signed)
Pt c/o central CP today radiating LT side neck and RT arm, 1 nitroglycerin at 1030.Marland Kitchen Endorses dizziness and emesis yesterday, reports falling, denies loc. Also c/o RT side rib pain.

## 2022-12-12 NOTE — ED Notes (Signed)
Reviewed discharge instructions and home care with pt. Pt verbalized understanding and had no further questions. Pt exited ED via wheelchair without complications. D/C with family.

## 2022-12-12 NOTE — ED Notes (Addendum)
Pt c/o of SOB and chest tightness while at rest. Placed on 2L of O2 for comfort. RT at bedside to assess. Pt O2 sat at 98%. O2 removed per MD.

## 2022-12-12 NOTE — ED Provider Notes (Signed)
[place] [facility] AT [facility] Provider Note   CSN: [id number] Arrival date & time: 12/12/22  1035     History  Chief Complaint  Patient presents with   Chest Pain    Sherri [name] is a 67 y.o. female With past medical history significant for hyperlipidemia, CAD, NSTEMI, hypertension, without history of tobacco use, diabetes who presents with concern for multiple symptoms today.  Patient reports some central chest pain starting today radiating to her left side, as well as into the neck.  Patient reports that she additionally has some pain on the right side of the neck, shoulder, right arm after falling and hitting the right arm and head in bathtub yesterday.  Patient denied any chest pain or shortness of breath prior to the fall.  She denies any cough, sore throat, or other upper respiratory infectious symptoms, reports that she did have a coworker who had COVID but it was more than 2 weeks ago.  She reports that she took her home oxycodone this morning and that her pain is slightly improved from initial onset.  She denies pain worse with exertion.   Chest Pain      Home Medications Prior to Admission medications   Medication Sig Start Date End Date Taking? Authorizing Provider  amLODipine (NORVASC) 10 MG tablet Take 1 tablet (10 mg total) by mouth daily. 11/28/20   Chrystie Nose, MD  aspirin EC 81 MG tablet Take 81 mg by mouth daily.    [provider]  buPROPion (WELLBUTRIN XL) 150 MG 24 hr tablet Take 1 tablet (150 mg total) by mouth daily. 05/06/17   Porfirio Oar, PA  busPIRone (BUSPAR) 15 MG tablet TAKE ONE TABLET BY MOUTH TWICE DAILY 03/05/19   [provider]  Butalbital-APAP-Caffeine [phone] MG capsule Take 1 capsule by mouth every 6 (six) hours as needed for headache.  05/12/17   [provider]  docusate sodium (COLACE) 100 MG capsule Take 1 capsule (100 mg total) by mouth 2 (two) times daily. 10/18/16   Elodia Florence, PA-C  Erenumab-aooe (AIMOVIG [place]) Inject into the skin every 30 (thirty) days. Unknown dose Given at Neurologist    [provider]  Evolocumab (REPATHA SURECLICK) 140 MG/ML SOAJ Inject 1 Dose into the skin every 14 (fourteen) days. 01/14/20   Hilty, Lisette Abu, MD  ezetimibe (ZETIA) 10 MG tablet Take 1 tablet (10 mg total) by mouth daily. 11/28/20 02/26/21  Chrystie Nose, MD  isosorbide mononitrate (IMDUR) 30 MG 24 hr tablet Take 0.5 tablets (15 mg total) by mouth daily. 11/28/20   Hilty, Lisette Abu, MD  metaxalone (SKELAXIN) 800 MG tablet Take 1 tablet (800 mg total) by mouth 3 (three) times daily as needed for pain. 02/27/12   Porfirio Oar, PA  metoprolol tartrate (LOPRESSOR) 50 MG tablet Take 1 tablet (50 mg total) by mouth 2 (two) times daily. 11/28/20   Chrystie Nose, MD  Multiple Vitamin tablet     [provider]  nitroGLYCERIN (NITROSTAT) 0.4 MG SL tablet Place 1 tablet (0.4 mg total) under the tongue every 5 (five) minutes as needed for chest pain. 10/03/18   Zigmund Daniel., MD  ondansetron (ZOFRAN) 4 MG tablet Take 1 tablet (4 mg total) by mouth every 6 (six) hours. 12/25/21   Rexford Maus, DO  oxyCODONE-acetaminophen (PERCOCET) 10-325 MG tablet Take 1-2 tablets by mouth every 4 (four) hours as needed for pain. 10/18/16   Elodia Florence, PA-C  pantoprazole (PROTONIX)  40 MG tablet Take 1 tablet (40 mg total) by mouth daily. 12/25/21   Rexford Maus, DO  valACYclovir (VALTREX) 500 MG tablet Take 1 PO QD for suppression; INCREASE to BID x 3 days PRN outbreak Patient taking differently: Take 500 mg by mouth daily. 05/06/17   Porfirio Oar, PA      Allergies    Effexor [venlafaxine hydrochloride], Iodine, Shellfish allergy, Codeine, Flexeril [cyclobenzaprine hcl], Hydromorphone, Nubain [nalbuphine hcl], Atorvastatin, Pravastatin, and Erythromycin    Review of Systems   Review of Systems  Cardiovascular:  Positive for chest pain.  All other  systems reviewed and are negative.   Physical Exam Updated Vital Signs BP (!) 131/56   Pulse 60   Temp 99.1 F (37.3 C)   Resp 18   Wt 96.2 kg   SpO2 100%   BMI 32.23 kg/m  Physical Exam Vitals and nursing note reviewed.  Constitutional:      General: She is not in acute distress.    Appearance: Normal appearance.  HENT:     Head: Normocephalic and atraumatic.  Eyes:     General:        Right eye: No discharge.        Left eye: No discharge.  Cardiovascular:     Rate and Rhythm: Normal rate and regular rhythm.     Heart sounds: No murmur heard.    No friction rub. No gallop.  Pulmonary:     Effort: Pulmonary effort is normal.     Breath sounds: Normal breath sounds.  Chest:     Comments: No wheezing, rhonchi, stridor, rales Abdominal:     General: Bowel sounds are normal.     Palpations: Abdomen is soft.  Genitourinary:    Rectum: Normal.     Comments: Soft brown stool in rectal vault Skin:    General: Skin is warm and dry.     Capillary Refill: Capillary refill takes less than 2 seconds.  Neurological:     Mental Status: She is alert and oriented to person, place, and time.     Comments: Cranial nerves II through XII grossly intact.  Intact finger-nose, intact heel-to-shin.  Mild unsteadiness with Romberg gait normal.  Alert and oriented x3.  Moves all 4 limbs spontaneously, normal coordination.  No pronator drift.  Intact strength 5 out of 5 bilateral upper and lower extremities.    Psychiatric:        Mood and Affect: Mood normal.        Behavior: Behavior normal.     ED Results / Procedures / Treatments   Labs (all labs ordered are listed, but only abnormal results are displayed) Labs Reviewed  BASIC METABOLIC PANEL - Abnormal; Notable for the following components:      Result Value   Glucose, Bld 120 (*)    BUN 26 (*)    Creatinine, Ser 1.46 (*)    GFR, Estimated 39 (*)    All other components within normal limits  CBC - Abnormal; Notable for the  following components:   Hemoglobin 8.8 (*)    HCT 29.8 (*)    MCV 72.3 (*)    MCH 21.4 (*)    MCHC 29.5 (*)    RDW 17.1 (*)    All other components within normal limits  OCCULT BLOOD X 1 CARD TO LAB, STOOL  TROPONIN I (HIGH SENSITIVITY)  TROPONIN I (HIGH SENSITIVITY)    EKG EKG Interpretation Date/Time:  Thursday December 12 2022 10:42:51 EST Ventricular  Rate:  66 PR Interval:  172 QRS Duration:  78 QT Interval:  390 QTC Calculation: 408 R Axis:   33  Text Interpretation: Normal sinus rhythm Normal ECG When compared with ECG of 25-Dec-2021 17:13, Sinus rhythm has replaced Ectopic atrial rhythm Nonspecific T wave abnormality now evident in Inferior leads No acute changes No significant change since last tracing Confirmed by Derwood Kaplan [phone]) on 12/12/2022 11:07:36 AM  Radiology CT Head Wo Contrast  Result Date: 12/12/2022 CLINICAL DATA:  Head trauma. EXAM: CT HEAD WITHOUT CONTRAST TECHNIQUE: Contiguous axial images were obtained from the base of the skull through the vertex without intravenous contrast. RADIATION DOSE REDUCTION: This exam was performed according to the departmental dose-optimization program which includes automated exposure control, adjustment of the mA and/or kV according to patient size and/or use of iterative reconstruction technique. COMPARISON:  CT scan head from 061/25/2014. FINDINGS: Brain: No evidence of acute infarction, hemorrhage, hydrocephalus, extra-axial collection or mass lesion/mass effect. Ventricles are normal. Cerebral volume is age appropriate. Vascular: No hyperdense vessel or unexpected calcification. Skull: Normal. Negative for fracture or focal lesion. Sinuses/Orbits: No acute finding. Other: Visualized mastoid air cells are unremarkable. No mastoid effusion. IMPRESSION: *No acute intracranial abnormality. Electronically Signed   By: Jules Schick M.D.   On: 12/12/2022 13:39   DG Chest 2 View  Result Date: 12/12/2022 CLINICAL DATA:   Shortness of breath. EXAM: CHEST - 2 VIEW COMPARISON:  10/01/2018. FINDINGS: Bilateral lung fields are clear. Bilateral costophrenic angles are clear. Normal cardio-mediastinal silhouette. No acute osseous abnormalities. The soft tissues are within normal limits. IMPRESSION: *No active cardiopulmonary disease. Electronically Signed   By: Jules Schick M.D.   On: 12/12/2022 13:15    Procedures Procedures    Medications Ordered in ED Medications  sodium chloride 0.9 % bolus 1,000 mL (1,000 mLs Intravenous New Bag/Given 12/12/22 1258)  magnesium sulfate IVPB 2 g 50 mL (0 g Intravenous Stopped 12/12/22 1348)  prochlorperazine (COMPAZINE) injection 10 mg (10 mg Intravenous Given 12/12/22 1240)  diphenhydrAMINE (BENADRYL) injection 25 mg (25 mg Intravenous Given 12/12/22 1240)  morphine (PF) 4 MG/ML injection 4 mg (4 mg Intravenous Given 12/12/22 1240)    ED Course/ Medical Decision Making/ A&P                                 Medical Decision Making  This patient is a 67 y.o. female  who presents to the ED for concern of fall, head injury, dizziness, chest pain, shortness of breath.   Differential diagnoses prior to evaluation: The emergent differential diagnosis includes, but is not limited to,  epidural hematoma, subdural hematoma, skull fracture, subarachnoid hemorrhage, unstable cervical spine fracture, concussion vs other MSK injury, ACS, AAS, PE, Mallory-Weiss, Boerhaave's, Pneumonia, acute bronchitis, asthma or COPD exacerbation, anxiety, MSK pain or traumatic injury to the chest, acid reflux versus other . This is not an exhaustive differential.   Past Medical History / Co-morbidities / Social History: Anxiety, hypertension, migraines, arthritis, CAD  Additional history: Chart reviewed. Pertinent results include: Reviewed echo and previous heart cath in 2020  Physical Exam: Physical exam performed. The pertinent findings include: No focal neurologic deficits, she can ambulate without  difficulty although endorses some mild dizziness on initial standing.  No wheezing, rhonchi, stridor, rales.  Normal appearance of stool in the rectum.  Lab Tests/Imaging studies: I personally interpreted labs/imaging and the pertinent results include: CBC is notable for no leukocytosis,  however she has worsening anemia, hemoglobin of 8.8 today from recent baseline of 10.2.  Microcytic quality.  BMP notable for new AKI, BUN 26 with creatinine of 1.46 from baseline around 0.68.  Her Hemoccult was negative and her troponin is negative x 2.  She is chest pain-free on reevaluation..  Independently interpreted CT head without contrast complaint of chest x-ray shows no evidence of acute intrathoracic abnormality or intracranial abnormality.   I agree with the radiologist interpretation.  Cardiac monitoring: EKG obtained and interpreted by myself and attending physician which shows: Normal sinus rhythm without significant change from last tracing   Medications: I ordered medication including migraine cocktail, and morphine for chest pain, migraine like headache after fall yesterday.  I have reviewed the patients home medicines and have made adjustments as needed.  Patient feeling significantly improved after medications.  Given her AKI, new anemia, and chest pain we had recommended admission to the hospital, but patient declined at this time, we had an extensive discussion of the risk of leaving with recommendation for admission including worsening clinical condition, possible death, patient understands and agrees and still declines admission at this time, reports that she will follow-up closely with her primary care doctor, as she is chest pain-free, no evidence of ACS, no focal neurologic deficits, I do not think that she needs to leave AGAINST MEDICAL ADVICE at this time, but nonetheless discussed risk of leaving with recommended hospitalization   Disposition: After consideration of the diagnostic results  and the patients response to treatment, I feel that patient has been stabilized for discharge and will follow-up closely with her PCP.   The patient is safe for discharge and has been instructed to return immediately for worsening symptoms, change in symptoms or any other concerns.  Final Clinical Impression(s) / ED Diagnoses Final diagnoses:  Anemia, unspecified type  AKI (acute kidney injury) (HCC)  Dehydration  Injury of head, initial encounter  Dizziness    Rx / DC Orders ED Discharge Orders     None         Olene Floss, PA-C 12/12/22 1429    Derwood Kaplan, MD 12/13/22 [phone]

## 2022-12-12 NOTE — ED Notes (Signed)
Patient transported to CT 

## 2022-12-12 NOTE — Discharge Instructions (Signed)
As we discussed we ultimately recommended hospital admission for your new anemia of unclear cause, as well as kidney injury likely secondary to dehydration, the fact that you are having some chest pain, dizziness.  You ultimately did not want to stay, as we discussed this is not our recommendation and you need to understand that your clinical condition could worsen, however I recommend that you follow-up closely with your primary care doctor, drink plenty of fluids including electrolyte containing fluids such as Pedialyte, Gatorade.  If you begin having chest pain again or worsening headache I recommend that you return for further evaluation.

## 2023-09-16 LAB — LAB REPORT - SCANNED
A1c: 5.6
EGFR: 60

## 2023-09-23 ENCOUNTER — Ambulatory Visit: Payer: Self-pay | Admitting: Internal Medicine

## 2024-01-05 ENCOUNTER — Emergency Department (HOSPITAL_BASED_OUTPATIENT_CLINIC_OR_DEPARTMENT_OTHER): Admitting: Radiology

## 2024-01-05 ENCOUNTER — Other Ambulatory Visit: Payer: Self-pay

## 2024-01-05 ENCOUNTER — Emergency Department (HOSPITAL_BASED_OUTPATIENT_CLINIC_OR_DEPARTMENT_OTHER)
Admission: EM | Admit: 2024-01-05 | Discharge: 2024-01-05 | Disposition: A | Discharge status: Home / Self Care | Attending: Emergency Medicine | Admitting: Emergency Medicine

## 2024-01-05 ENCOUNTER — Encounter (HOSPITAL_BASED_OUTPATIENT_CLINIC_OR_DEPARTMENT_OTHER): Payer: Self-pay | Admitting: Emergency Medicine

## 2024-01-05 DIAGNOSIS — R0789 Other chest pain: Secondary | ICD-10-CM | POA: Insufficient documentation

## 2024-01-05 DIAGNOSIS — I10 Essential (primary) hypertension: Secondary | ICD-10-CM | POA: Diagnosis not present

## 2024-01-05 DIAGNOSIS — Z79899 Other long term (current) drug therapy: Secondary | ICD-10-CM | POA: Insufficient documentation

## 2024-01-05 DIAGNOSIS — Z7982 Long term (current) use of aspirin: Secondary | ICD-10-CM | POA: Insufficient documentation

## 2024-01-05 DIAGNOSIS — I2510 Atherosclerotic heart disease of native coronary artery without angina pectoris: Secondary | ICD-10-CM | POA: Insufficient documentation

## 2024-01-05 DIAGNOSIS — R079 Chest pain, unspecified: Secondary | ICD-10-CM

## 2024-01-05 LAB — BASIC METABOLIC PANEL WITH GFR
Anion gap: 13 (ref 5–15)
BUN: 17 mg/dL (ref 8–23)
CO2: 23 mmol/L (ref 22–32)
Calcium: 10.2 mg/dL (ref 8.9–10.3)
Chloride: 103 mmol/L (ref 98–111)
Creatinine, Ser: 1.44 mg/dL — ABNORMAL HIGH (ref 0.44–1.00)
GFR, Estimated: 40 mL/min — ABNORMAL LOW
Glucose, Bld: 132 mg/dL — ABNORMAL HIGH (ref 70–99)
Potassium: 4.5 mmol/L (ref 3.5–5.1)
Sodium: 140 mmol/L (ref 135–145)

## 2024-01-05 LAB — TROPONIN T, HIGH SENSITIVITY
Troponin T High Sensitivity: 17 ng/L (ref 0–19)
Troponin T High Sensitivity: <15 ng/L (ref 0–19)

## 2024-01-05 LAB — CBC
HCT: 34.7 % — ABNORMAL LOW (ref 36.0–46.0)
Hemoglobin: 10.7 g/dL — ABNORMAL LOW (ref 12.0–15.0)
MCH: 24.3 pg — ABNORMAL LOW (ref 26.0–34.0)
MCHC: 30.8 g/dL (ref 30.0–36.0)
MCV: 78.7 fL — ABNORMAL LOW (ref 80.0–100.0)
Platelets: 346 K/uL (ref 150–400)
RBC: 4.41 MIL/uL (ref 3.87–5.11)
RDW: 17.5 % — ABNORMAL HIGH (ref 11.5–15.5)
WBC: 8.3 K/uL (ref 4.0–10.5)
nRBC: 0 % (ref 0.0–0.2)

## 2024-01-05 MED ORDER — ASPIRIN 81 MG PO CHEW
324.0000 mg | CHEWABLE_TABLET | Freq: Once | ORAL | Status: AC
  Administered 2024-01-05: 324 mg via ORAL
  Filled 2024-01-05: qty 4

## 2024-01-05 NOTE — ED Provider Triage Note (Addendum)
 Emergency Medicine Provider Triage Evaluation Note  Sherri [name] , a 68 y.o. female  was evaluated in triage.  Pt complains of chest pain with radiation to the neck and shoulder.  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  No exertional symptoms.  She took 2 nitroglycerin  without improvement.  She felt lightheaded.  Symptoms remind her of previous MI.  Review of Systems  Positive: Chest pain with radiation Negative: Vomiting  Physical Exam  BP 124/78 (BP Location: Right Arm)   Pulse 75   Temp 98.1 F (36.7 C) (Oral)   Resp (!) 22   SpO2 100%  Gen:   Awake, no distress   Resp:  Normal effort  MSK:   Moves extremities without difficulty  Other:    Medical Decision Making  Medically screening exam initiated at 12:30 PM.  Appropriate orders placed.  VASILIKI SMALDONE was informed that the remainder of the evaluation will be completed by another provider, this initial triage assessment does not replace that evaluation, and the importance of remaining in the ED until their evaluation is complete.  EKG reviewed with Dr. Rogelia, no STEMI.  Labs and enzymes ordered.  Aspirin  ordered.   Desiderio Chew, PA-C 01/05/24 1231  1:25 PM First trop at 17.    Desiderio Chew, PA-C 01/05/24 1325

## 2024-01-05 NOTE — Discharge Instructions (Addendum)
 Sharman GORMAN Boon  Thank you for allowing us  to take care of you today.  You came to the Emergency Department today because you are having chest pain earlier today, your symptoms improved while you were here.  We gave you some aspirin , your initial EKG was a bit concerning, however your symptoms improved and your repeat EKG was better.  Your heart numbers are reassuring, therefore you are not having a heart attack, you do need to follow-up closely with your cardiologist, therefore we have given you a referral to follow back up with Dr. Mona.  Please call his office to help schedule your appointment.   To-Do: 1. Please follow-up with your primary doctor within 1 - 2 weeks / as soon as possible.   Please return to the Emergency Department or call 911 if you experience have worsening of your symptoms, or do not get better, new or different chest pain, shortness of breath, severe or significantly worsening pain, high fever, severe confusion, pass out or have any reason to think that you need emergency medical care.   We hope you feel better soon.   Department of Emergency Medicine [facility]

## 2024-01-05 NOTE — ED Triage Notes (Signed)
 Chest pain in to neck and left arm   My chest is killing me Started around noon No relief with nitroglycerin  x 2

## 2024-01-05 NOTE — ED Notes (Signed)
 ED Provider at bedside.

## 2024-01-05 NOTE — ED Provider Notes (Signed)
 " [place] EMERGENCY DEPARTMENT AT [facility] Provider Note   CSN: [id number] Arrival date & time: 01/05/24  1214     Patient presents with: Chest Pain   Sherri [name] is a 68 y.o. female.   Patient with history of NSTEMI in 2020, found to have severe single-vessel coronary artery disease with 99% mid LAD stenosis, mild nonobstructive coronary artery disease involving the circumflex and RCA, history of hypertension --presents to the emergency department today for evaluation of chest pain.  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  Work was not particularly strenuous.  Pain radiated to the shoulder and neck and back.  She felt lightheaded but did not syncopized.  She states that pain reminded her of previous MI.  No vomiting or diaphoresis.  She took 2 nitroglycerin  without improvement.         Prior to Admission medications  Medication Sig Start Date End Date Taking? Authorizing Provider  amLODipine  (NORVASC ) 10 MG tablet Take 1 tablet (10 mg total) by mouth daily. 11/28/20   Mona Vinie BROCKS, MD  aspirin  EC 81 MG tablet Take 81 mg by mouth daily.    [provider]  buPROPion  (WELLBUTRIN  XL) 150 MG 24 hr tablet Take 1 tablet (150 mg total) by mouth daily. 05/06/17   Juliane Che, PA  busPIRone (BUSPAR) 15 MG tablet TAKE ONE TABLET BY MOUTH TWICE DAILY 03/05/19   [provider]  Butalbital -APAP-Caffeine  50-325-40 MG capsule Take 1 capsule by mouth every 6 (six) hours as needed for headache.  05/12/17   [provider]  docusate sodium  (COLACE) 100 MG capsule Take 1 capsule (100 mg total) by mouth 2 (two) times daily. 10/18/16   Lenis Barter, PA-C  Erenumab-aooe (AIMOVIG [place]) Inject into the skin every 30 (thirty) days. Unknown dose Given at Neurologist    [provider]  Evolocumab  (REPATHA  SURECLICK) 140 MG/ML SOAJ Inject 1 Dose into the skin every 14 (fourteen) days. 01/14/20   Hilty, Vinie BROCKS,  MD  ezetimibe  (ZETIA ) 10 MG tablet Take 1 tablet (10 mg total) by mouth daily. 11/28/20 02/26/21  Mona Vinie BROCKS, MD  isosorbide  mononitrate (IMDUR ) 30 MG 24 hr tablet Take 0.5 tablets (15 mg total) by mouth daily. 11/28/20   Hilty, Vinie BROCKS, MD  metaxalone  (SKELAXIN ) 800 MG tablet Take 1 tablet (800 mg total) by mouth 3 (three) times daily as needed for pain. 02/27/12   Juliane Che, PA  metoprolol  tartrate (LOPRESSOR ) 50 MG tablet Take 1 tablet (50 mg total) by mouth 2 (two) times daily. 11/28/20   Mona Vinie BROCKS, MD  Multiple Vitamin tablet     [provider]  nitroGLYCERIN  (NITROSTAT ) 0.4 MG SL tablet Place 1 tablet (0.4 mg total) under the tongue every 5 (five) minutes as needed for chest pain. 10/03/18   Perri DELENA Meliton Mickey., MD  ondansetron  (ZOFRAN ) 4 MG tablet Take 1 tablet (4 mg total) by mouth every 6 (six) hours. 12/25/21   Kingsley, Victoria K, DO  oxyCODONE -acetaminophen  (PERCOCET) 10-325 MG tablet Take 1-2 tablets by mouth every 4 (four) hours as needed for pain. 10/18/16   Lenis Barter, PA-C  pantoprazole  (PROTONIX ) 40 MG tablet Take 1 tablet (40 mg total) by mouth daily. 12/25/21   Kingsley, Victoria K, DO  valACYclovir  (VALTREX ) 500 MG tablet Take 1 PO QD for suppression; INCREASE to BID x 3 days PRN outbreak Patient taking differently: Take 500 mg by mouth daily. 05/06/17   Juliane Che,  PA    Allergies: Effexor [venlafaxine hydrochloride], Iodine, Shellfish allergy, Codeine, Flexeril [cyclobenzaprine hcl], Hydromorphone , Nubain [nalbuphine hcl], Atorvastatin , Pravastatin , and Erythromycin    Review of Systems  Updated Vital Signs BP 124/78 (BP Location: Right Arm)   Pulse 75   Temp 98.1 F (36.7 C) (Oral)   Resp (!) 22   SpO2 100%   Physical Exam Vitals and nursing note reviewed.  Constitutional:      Appearance: She is well-developed. She is not diaphoretic.  HENT:     Head: Normocephalic and atraumatic.     Mouth/Throat:     Mouth: Mucous  membranes are not dry.  Eyes:     Conjunctiva/sclera: Conjunctivae normal.  Neck:     Vascular: Normal carotid pulses. No JVD.     Trachea: Trachea normal. No tracheal deviation.  Cardiovascular:     Rate and Rhythm: Normal rate and regular rhythm.     Pulses: No decreased pulses.          Radial pulses are 2+ on the right side and 2+ on the left side.     Heart sounds: Normal heart sounds, S1 normal and S2 normal. No murmur heard. Pulmonary:     Effort: Pulmonary effort is normal. No respiratory distress.     Breath sounds: No wheezing, rhonchi or rales.  Chest:     Chest wall: No tenderness.  Abdominal:     General: Bowel sounds are normal.     Palpations: Abdomen is soft.     Tenderness: There is no abdominal tenderness. There is no guarding or rebound.  Musculoskeletal:        General: Normal range of motion.     Cervical back: Normal range of motion and neck supple. No muscular tenderness.  Skin:    General: Skin is warm and dry.     Coloration: Skin is not pale.  Neurological:     Mental Status: She is alert.     (all labs ordered are listed, but only abnormal results are displayed) Labs Reviewed  BASIC METABOLIC PANEL WITH GFR - Abnormal; Notable for the following components:      Result Value   Glucose, Bld 132 (*)    Creatinine, Ser 1.44 (*)    GFR, Estimated 40 (*)    All other components within normal limits  CBC - Abnormal; Notable for the following components:   Hemoglobin 10.7 (*)    HCT 34.7 (*)    MCV 78.7 (*)    MCH 24.3 (*)    RDW 17.5 (*)    All other components within normal limits  TROPONIN T, HIGH SENSITIVITY  TROPONIN T, HIGH SENSITIVITY    EKG: EKG Interpretation Date/Time:  Monday January 05 2024 12:22:03 EST Ventricular Rate:  79 PR Interval:  187 QRS Duration:  76 QT Interval:  389 QTC Calculation: 446 R Axis:   60  Text Interpretation: Sinus rhythm Atrial premature complex Borderline ST elevation, inferior leads Confirmed by  Rogelia Satterfield ([id number]) on 01/05/2024 2:52:25 PM  Radiology: DG Chest 2 View Result Date: 01/05/2024 EXAM: 2 VIEW(S) XRAY OF THE CHEST 01/05/2024 01:12:59 PM COMPARISON: 12/12/2022 CLINICAL HISTORY: chest pain FINDINGS: LUNGS AND PLEURA: No focal pulmonary opacity. No pleural effusion. No pneumothorax. HEART AND MEDIASTINUM: No acute abnormality of the cardiac and mediastinal silhouettes. BONES AND SOFT TISSUES: No acute osseous abnormality. IMPRESSION: 1. No acute findings. Electronically signed by: Michaeline Blanch MD 01/05/2024 02:30 PM EST RP Workstation: [id number]     Procedures  Medications Ordered in the ED  aspirin  chewable tablet 324 mg (324 mg Oral Given 01/05/24 1236)   ED Course  Patient seen and examined initially by myself in triage. History obtained directly from patient.  Aspirin  was ordered and given.  Labs/EKG: Ordered CBC hemoglobin 10.7 otherwise unremarkable; BMP creatinine 1.44 with a glucose of 132 otherwise unremarkable; first troponin was 17.  Imaging: Chest x-ray personally reviewed and interpreted, appears negative.  Medications/Fluids: None ordered  Most recent vital signs reviewed and are as follows: BP 124/78 (BP Location: Right Arm)   Pulse 75   Temp 98.1 F (36.7 C) (Oral)   Resp (!) 22   SpO2 100%   Initial impression: Chest pain in patient with history of ACS.  2:29 PM patient evaluated upon placement in a room.  Patient states that her chest pain is a little bit better.  Complains of a headache from nitro taken earlier.  Awaiting repeat EKG, second troponin.  She appears stable from earlier.  4:06 PM Reassessment performed. Patient appears stable during recheck.   Repeat EKG stable, 2nd troponin now <15.   Patient discussed with and seen by Dr. Rogelia, has cleared for dc. She discussed follow-up instructions with the patient.   Most current vital signs reviewed and are as follows: BP 139/75   Pulse 72   Temp 98.2 F (36.8 C) (Oral)    Resp 12   SpO2 100%   Plan: Discharge to home.   Return and follow-up instructions: I encouraged patient to return to ED with severe chest pain, especially if the pain is crushing or pressure-like and spreads to the arms, back, neck, or jaw, or if they have associated sweating, vomiting, or shortness of breath with the pain, or significant pain with activity. We discussed that the evaluation here today indicates a low-risk of serious cause of chest pain, including heart trouble or a blood clot, but no evaluation is perfect and chest pain can evolve with time. The patient verbalized understanding and agreed.  I encouraged patient to follow-up with their provider in the next 48 hours for recheck.                                      Medical Decision Making Amount and/or Complexity of Data Reviewed Labs: ordered. Radiology: ordered.  Risk OTC drugs.   For this patient's complaint of chest pain, the following emergent conditions were considered on the differential diagnosis: acute coronary syndrome, pulmonary embolism, pneumothorax, myocarditis, pericardial tamponade, aortic dissection, thoracic aortic aneurysm complication, esophageal perforation.   Other causes were also considered including: gastroesophageal reflux disease, musculoskeletal pain including costochondritis, pneumonia/pleurisy, herpes zoster, pericarditis.  In regards to possibility of ACS, pain is nonexertional but does radiate, no associated vomiting or diaphoresis. EKG is stable here and negative troponins x 2.   In regards to possibility of PE, symptoms are atypical for PE and risk profile is low, making PE low likelihood.   Symptoms improving during ED stay.   The patient's vital signs, pertinent lab work and imaging were reviewed and interpreted as discussed in the ED course. Hospitalization was considered for further testing, treatments, or serial exams/observation. However as patient is well-appearing, has a stable  exam, and reassuring studies today, I do not feel that they warrant admission at this time. This plan was discussed with the patient who verbalizes agreement and comfort with this plan and seems reliable and  able to return to the Emergency Department with worsening or changing symptoms.        Final diagnoses:  Chest pain, unspecified type    ED Discharge Orders          Ordered    Ambulatory referral to Cardiology       Comments: If you have not heard from the Cardiology office within the next 72 hours please call [phone].   01/05/24 1542               Desiderio Chew, PA-C 01/05/24 1609  "

## 2024-01-14 ENCOUNTER — Encounter: Payer: Self-pay | Admitting: Cardiology

## 2024-01-14 NOTE — Progress Notes (Signed)
 "    Referring-Lawrence Rogelia, MD Reason for referral-chest pain  HPI: 69 year old female for evaluation of chest pain at request of Jerilynn Rogelia, MD.  Patient previously followed by Dr. Mona.  Echocardiogram September 2020 showed normal LV function.  Cardiac catheterization September 2020 showed 99% mid LAD and mild nonobstructive disease in the circumflex and RCA; apical hypokinesis with preserved LV function.  Patient had PCI of the LAD with 2 drug-eluting stents.  Based on most recent notes that she has had some difficulty with chest pain following her stent.  Patient seen in emergency room January 05, 2024 with chest pain.  Chest x-ray showed no acute findings.  Troponins were normal.  Hemoglobin 10.7; creatinine 1.44. Cardiology now asked to evaluate.  Patient states that on the day of her evaluation she developed left-sided chest pain radiating to her back, arm and neck.  She states it was similar to her previous cardiac pain.  Lasted for 1 hour and resolved spontaneously.  There was associated dyspnea.  There was a pleuritic component.  She otherwise denies dyspnea on exertion, orthopnea, PND, pedal edema, exertional chest pain or syncope.  Current Outpatient Medications  Medication Sig Dispense Refill   amLODipine  (NORVASC ) 10 MG tablet Take 1 tablet (10 mg total) by mouth daily. 90 tablet 3   aspirin  EC 81 MG tablet Take 81 mg by mouth daily.     atenolol  (TENORMIN ) 100 MG tablet Take 100 mg by mouth daily.     baclofen (LIORESAL) 10 MG tablet Take 10 mg by mouth 2 (two) times daily.     Biotin 5 MG CAPS Take 5 mg by mouth daily at 6 (six) AM.     buPROPion  (WELLBUTRIN  XL) 150 MG 24 hr tablet Take 1 tablet (150 mg total) by mouth daily. 90 tablet 3   busPIRone (BUSPAR) 15 MG tablet TAKE ONE TABLET BY MOUTH TWICE DAILY     Butalbital -APAP-Caffeine  50-325-40 MG capsule Take 1 capsule by mouth every 6 (six) hours as needed for headache.      calcium  carbonate (TUMS - DOSED IN MG  ELEMENTAL CALCIUM ) 500 MG chewable tablet Chew 500 mg by mouth as needed.     DOXYLAMINE SUCCINATE, SLEEP, PO Take 0.25 mg by mouth daily at 6 (six) AM.     Erenumab-aooe (AIMOVIG [place]) Inject into the skin every 30 (thirty) days. Unknown dose Given at Neurologist     Evolocumab  (REPATHA  SURECLICK) 140 MG/ML SOAJ Inject 1 Dose into the skin every 14 (fourteen) days. 2 mL 3   ezetimibe  (ZETIA ) 10 MG tablet Take 1 tablet (10 mg total) by mouth daily. 90 tablet 3   isosorbide  mononitrate (IMDUR ) 30 MG 24 hr tablet Take 0.5 tablets (15 mg total) by mouth daily. 45 tablet 3   lidocaine  (LIDODERM ) 5 % Place 1 patch onto the skin as needed.     meloxicam  (MOBIC ) 15 MG tablet Take 15 mg by mouth daily.     metoprolol  tartrate (LOPRESSOR ) 50 MG tablet Take 1 tablet (50 mg total) by mouth 2 (two) times daily. 180 tablet 3   Multiple Vitamin tablet      naloxone (NARCAN) nasal spray 4 mg/0.1 mL Place 1 spray into the nose once.     ondansetron  (ZOFRAN ) 4 MG tablet Take 1 tablet (4 mg total) by mouth every 6 (six) hours. 12 tablet 0   Oxycodone  HCl 10 MG TABS Take 20 mg by mouth.     oxyCODONE -acetaminophen  (PERCOCET) 10-325 MG tablet Take 1-2 tablets  by mouth every 4 (four) hours as needed for pain. 40 tablet 0   pantoprazole  (PROTONIX ) 40 MG tablet Take 1 tablet (40 mg total) by mouth daily. 30 tablet 0   PARoxetine  (PAXIL ) 40 MG tablet Take 40 mg by mouth every morning.     promethazine  (PHENERGAN ) 25 MG tablet Take 25 mg by mouth as needed.     rosuvastatin (CRESTOR) 10 MG tablet Take 10 mg by mouth at bedtime.     valACYclovir  (VALTREX ) 500 MG tablet Take 1 PO QD for suppression; INCREASE to BID x 3 days PRN outbreak (Patient taking differently: Take 500 mg by mouth as needed.) 100 tablet 3   docusate sodium  (COLACE) 100 MG capsule Take 1 capsule (100 mg total) by mouth 2 (two) times daily. (Patient not taking: Reported on 01/20/2024) 30 capsule 0   metaxalone  (SKELAXIN ) 800 MG tablet Take 1 tablet (800  mg total) by mouth 3 (three) times daily as needed for pain. (Patient not taking: Reported on 01/20/2024) 90 tablet 3   metoCLOPramide  (REGLAN ) 10 MG tablet Take 10 mg by mouth as needed. (Patient not taking: Reported on 01/20/2024)     nitroGLYCERIN  (NITROSTAT ) 0.4 MG SL tablet Place 1 tablet (0.4 mg total) under the tongue every 5 (five) minutes as needed for chest pain. (Patient not taking: Reported on 01/20/2024) 15 tablet 0   No current facility-administered medications for this visit.    Allergies[1]   Past Medical History:  Diagnosis Date   Anxiety    Arthritis    Breast pain, right    CAD (coronary artery disease)    Cancer (HCC)    ? breast, cervical    Closed left ankle fracture 12/17/2015   Depression    situational   Hyperlipidemia    Hypertension    not taken atenolol  or lisinopril  in >58mos, no money   Migraines    Pneumonia    PUD (peptic ulcer disease)    Ruptured disk     Past Surgical History:  Procedure Laterality Date   AUGMENTATION MAMMAPLASTY Right    1979   BREAST SURGERY     lumpectomy in 20s for benign mass with implant placement (subglandular)   CARPAL TUNNEL RELEASE     right wrist   CARPAL TUNNEL RELEASE Right 09/02/2014   Procedure: RIGHT CARPAL TUNNEL RELEASE;  Surgeon: Maude Herald, MD;  Location: [facility];  Service: Orthopedics;  Laterality: Right;   CERVICAL CONE BIOPSY     COLONOSCOPY     CORONARY STENT INTERVENTION N/A 10/02/2018   Procedure: CORONARY STENT INTERVENTION;  Surgeon: Mady Bruckner, MD;  Location: [facility];  Service: Cardiovascular;  Laterality: N/A;   KNEE ARTHROSCOPY Left 07/1996   KNEE ARTHROSCOPY Right 1998   LEFT HEART CATH AND CORONARY ANGIOGRAPHY N/A 10/02/2018   Procedure: LEFT HEART CATH AND CORONARY ANGIOGRAPHY;  Surgeon: Mady Bruckner, MD;  Location: [facility];  Service: Cardiovascular;  Laterality: N/A;   ORIF ANKLE FRACTURE Left 12/17/2015   Procedure: OPEN REDUCTION  INTERNAL FIXATION (ORIF) ANKLE FRACTURE;  Surgeon: Alm Hummer, MD;  Location: [facility];  Service: Orthopedics;  Laterality: Left;   SPINE SURGERY  72011   L2-L5 stabilization   TOTAL HIP ARTHROPLASTY Right 10/15/2016   TOTAL HIP ARTHROPLASTY Right 10/15/2016   Procedure: TOTAL HIP ARTHROPLASTY ANTERIOR APPROACH;  Surgeon: Herald Maude, MD;  Location: [facility];  Service: Orthopedics;  Laterality: Right;    Social History   Socioeconomic History   Marital  status: Widowed    Spouse name: Lemond   Number of children: 0   Years of education: 15   Highest education level: Some college, no degree  Occupational History    Employer: [employer]   Tobacco Use   Smoking status: Former    Current packs/day: 0.00    Types: Cigarettes    Start date: 01/08/1975    Quit date: 01/08/1976    Years since quitting: 48.0   Smokeless tobacco: Never  Vaping Use   Vaping status: Never Used  Substance and Sexual Activity   Alcohol use: No    Alcohol/week: 0.0 standard drinks of alcohol   Drug use: No   Sexual activity: Not on file  Other Topics Concern   Not on file  Social History Narrative   Husband died 10/20/11 of end-stage COPD.  Adversarial relationship with his ex-wife who lives in a house he was paying for.  Attorneys for both parties are involved in attempt to settle the conflict and financial matters of his will.         Update 09/30/2018   Right handed   Caffeine : 1 cup of coffee/day   Lives alone   Social Drivers of Health   Tobacco Use: Medium Risk (01/20/2024)   Patient History    Smoking Tobacco Use: Former    Smokeless Tobacco Use: Never    Passive Exposure: Not on file  Financial Resource Strain: Low Risk (03/11/2023)   Received from [facility]   Overall Financial Resource Strain (CARDIA)    Difficulty of Paying Living Expenses: Not hard at all  Food Insecurity: No Food Insecurity (09/15/2023)   [facility]   Epic    Within the past 12 months, you worried  that your food would run out before you got the money to buy more.: Never true    Within the past 12 months, the food you bought just didn't last and you didn't have money to get more.: Never true  Transportation Needs: No Transportation Needs (09/15/2023)   Received from [facility]    In the past 12 months, has lack of transportation kept you from medical appointments or from getting medications?: No    In the past 12 months, has lack of transportation kept you from meetings, work, or from getting things needed for daily living?: No  Physical Activity: Insufficiently Active (09/15/2023)   Received from [facility]   Exercise Vital Sign    On average, how many days per week do you engage in moderate to strenuous exercise (like a brisk walk)?: 2 days    On average, how many minutes do you engage in exercise at this level?: 10 min  Stress: Stress Concern Present (09/15/2023)   Received from [facility] - Occupational Stress Questionnaire    Do you feel stress - tense, restless, nervous, or anxious, or unable to sleep at night because your mind is troubled all the time - these days?: To some extent  Social Connections: Socially Integrated (09/15/2023)   Received from [facility]   Social Network    How would you rate your social network (family, work, friends)?: Good participation with social networks  Intimate Partner Violence: Not At Risk (09/15/2023)   Received from [facility]   HITS    Over the last 12 months how often did your partner physically hurt you?: Never    Over the last 12 months how often did your  partner insult you or talk down to you?: Never    Over the last 12 months how often did your partner threaten you with physical harm?: Never    Over the last 12 months how often did your partner scream or curse at you?: Never  Depression (PHQ2-9): Not on file  Alcohol Screen: Not on file  Housing: Low Risk (09/15/2023)   Received  from [facility]    In the last 12 months, was there a time when you were not able to pay the mortgage or rent on time?: No    In the past 12 months, how many times have you moved where you were living?: 0    At any time in the past 12 months, were you homeless or living in a shelter (including now)?: No  Utilities: Not At Risk (09/15/2023)   Received from [facility]    In the past 12 months has the electric, gas, oil, or water company threatened to shut off services in your home?: No  Health Literacy: Not on file    Family History  Problem Relation Age of Onset   Diabetes Mother    Glaucoma Mother    Cancer Mother        breast, ovarian   Heart disease Mother    Breast cancer Mother 7   Migraines Mother    Gout Father    Cancer Sister        ovarian, uterine cancer   Migraines Sister    Migraines Brother    Migraines Maternal Grandmother     ROS: no fevers or chills, productive cough, hemoptysis, dysphasia, odynophagia, melena, hematochezia, dysuria, hematuria, rash, seizure activity, orthopnea, PND, pedal edema, claudication. Remaining systems are negative.  Physical Exam:   Blood pressure 104/60, pulse 75, height 5' 8 (1.727 m), weight 198 lb 4.8 oz (89.9 kg), SpO2 95%.  General:  Well developed/well nourished in NAD Skin warm/dry Patient not depressed No peripheral clubbing Back-normal HEENT-normal/normal eyelids Neck supple/normal carotid upstroke bilaterally; no bruits; no JVD; no thyromegaly chest - CTA/ normal expansion CV - RRR/normal S1 and S2; no murmurs, rubs or gallops;  PMI nondisplaced Abdomen -NT/ND, no HSM, no mass, + bowel sounds, no bruit 2+ femoral pulses, no bruits Ext-no edema, chords, 2+ DP Neuro-grossly nonfocal  EKG Interpretation Date/Time:  Tuesday January 20 2024 10:40:51 EST Ventricular Rate:  75 PR Interval:  206 QRS Duration:  84 QT Interval:  408 QTC Calculation: 455 R Axis:   10  Text Interpretation: Sinus  rhythm with Premature atrial complexes Confirmed by Pietro Rogue ([id number]) on 01/20/2024 10:51:57 AM    A/P  1 chest pain-symptoms with both typical and atypical features.  However her electrocardiogram showed no ST changes and troponins were normal despite 1 hour of chest pain.  She has had no recurrent symptoms and does not have exertional chest pain.  Will plan stress PET to screen for ischemia.  Patient in agreement.  2 coronary artery disease with history of PCI of the LAD-continue aspirin  and statin.  3 hyperlipidemia-Continue Crestor, Repatha  and Zetia .  4 hypertension-patient's blood pressure is controlled.  Continue present medications.  Rogue Pietro, MD     [1]  Allergies Allergen Reactions   Cyclobenzaprine Hcl     Other Reaction(s): Not available, other  cyclobenzaprine hydrochloride   Gabapentin Dermatitis and Nausea And Vomiting    gabapentin   Iodine Anaphylaxis   Nalbuphine Hcl     Other Reaction(s): Not  available, other, Psychosis  nalbuphine hydrochloride   Pravastatin      Joint pain, myalgias  Other Reaction(s): other  pravastatin    Pregabalin Dermatitis and Nausea And Vomiting    pregabalin   Shellfish Allergy Anaphylaxis and Other (See Comments)   Venlafaxine Hydrochloride Dermatitis and Shortness Of Breath    Other Reaction(s): other  venlafaxine   Codeine Rash and Swelling    SWELLING REACTION UNSPECIFIED  Other Reaction(s): Not available, Psychosis   Hydromorphone  Other (See Comments)    HALLUCINATIONS   Atorvastatin  Other (See Comments)    Severe joint pain   Erythromycin Rash   "

## 2024-01-20 ENCOUNTER — Ambulatory Visit: Attending: Cardiology | Admitting: Cardiology

## 2024-01-20 ENCOUNTER — Encounter: Payer: Self-pay | Admitting: Cardiology

## 2024-01-20 VITALS — BP 104/60 | HR 75 | Ht 68.0 in | Wt 198.3 lb

## 2024-01-20 DIAGNOSIS — E7800 Pure hypercholesterolemia, unspecified: Secondary | ICD-10-CM

## 2024-01-20 DIAGNOSIS — Z9861 Coronary angioplasty status: Secondary | ICD-10-CM

## 2024-01-20 DIAGNOSIS — I2510 Atherosclerotic heart disease of native coronary artery without angina pectoris: Secondary | ICD-10-CM

## 2024-01-20 DIAGNOSIS — R079 Chest pain, unspecified: Secondary | ICD-10-CM

## 2024-01-20 DIAGNOSIS — I10 Essential (primary) hypertension: Secondary | ICD-10-CM

## 2024-01-20 NOTE — Patient Instructions (Signed)
 Medication Instructions:  Continue same medications   Lab Work: None ordered  Testing/Procedures: Cardiac Pet Scan will be scheduled at [facility] after approved by insurance   Follow instructions below  Follow-Up: At [facility], you and your health needs are our priority.  As part of our continuing mission to provide you with exceptional heart care, our providers are all part of one team.  This team includes your primary Cardiologist (physician) and Advanced Practice Providers or APPs (Physician Assistants and Nurse Practitioners) who all work together to provide you with the care you need, when you need it.  Your next appointment:  6 weeks     Provider:  PA     Schedule appointment with Dr.Crenshaw in 6 months   Call in April to schedule July appointment       Please report to Radiology at the [facility] Main Entrance 30 minutes early for your test.  [address] [place], [place] [address]                         OR   Please report to Radiology at [facility] Main Entrance, medical mall, 30 mins prior to your test.  [address]  [place], [place]  How to Prepare for Your Cardiac PET/CT Stress Test:  Nothing to eat or drink, except water, 3 hours prior to arrival time.  NO caffeine /decaffeinated products, or chocolate 12 hours prior to arrival. (Please note decaffeinated beverages (teas/coffees) still contain caffeine ).  If you have caffeine  within 12 hours prior, the test will need to be rescheduled.  Medication instructions: Do not take erectile dysfunction medications for 72 hours prior to test (sildenafil, tadalafil) Do not take nitrates (isosorbide  mononitrate, Ranexa) the day before or day of test Do not take tamsulosin the day before or morning of test Hold theophylline containing medications for 12 hours. Hold Dipyridamole 48 hours prior to the test.  Diabetic Preparation: If able to eat breakfast  prior to 3 hour fasting, you may take all medications, including your insulin. Do not worry if you miss your breakfast dose of insulin - start at your next meal. If you do not eat prior to 3 hour fast-Hold all diabetes (oral and insulin) medications. Patients who wear a continuous glucose monitor MUST remove the device prior to scanning.  You may take your remaining medications with water.  NO perfume, cologne or lotion on chest or abdomen area. FEMALES - Please avoid wearing dresses to this appointment.  Total time is 1 to 2 hours; you may want to bring reading material for the waiting time.    In preparation for your appointment, medication and supplies will be purchased.  Appointment availability is limited, so if you need to cancel or reschedule, please call the Radiology Department Scheduler at [phone] 24 hours in advance to avoid a cancellation fee of $100.00  What to Expect When you Arrive:  Once you arrive and check in for your appointment, you will be taken to a preparation room within the Radiology Department.  A technologist or Nurse will obtain your medical history, verify that you are correctly prepped for the exam, and explain the procedure.  Afterwards, an IV will be started in your arm and electrodes will be placed on your skin for EKG monitoring during the stress portion of the exam. Then you will be escorted to the PET/CT scanner.  There, staff will get you positioned on  the scanner and obtain a blood pressure and EKG.  During the exam, you will continue to be connected to the EKG and blood pressure machines.  A small, safe amount of a radioactive tracer will be injected in your IV to obtain a series of pictures of your heart along with an injection of a stress agent.    After your Exam:  It is recommended that you eat a meal and drink a caffeinated beverage to counter act any effects of the stress agent.  Drink plenty of fluids for the remainder of the day and urinate  frequently for the first couple of hours after the exam.  Your doctor will inform you of your test results within 7-10 business days.  For more information and frequently asked questions, please visit our website: [email or web address]  For questions about your test or how to prepare for your test, please call: Cardiac Imaging Nurse Navigators Office: [phone]  We recommend signing up for the patient portal called MyChart.  Sign up information is provided on this After Visit Summary.  MyChart is used to connect with patients for Virtual Visits (Telemedicine).  Patients are able to view lab/test results, encounter notes, upcoming appointments, etc.  Non-urgent messages can be sent to your provider as well.   To learn more about what you can do with MyChart, go to [email or web address].

## 2024-02-10 ENCOUNTER — Telehealth: Payer: Self-pay | Admitting: Cardiology

## 2024-03-16 ENCOUNTER — Ambulatory Visit: Admitting: Cardiology

## 2024-03-23 ENCOUNTER — Ambulatory Visit: Admitting: Cardiology
